# Patient Record
Sex: Female | Born: 1960 | Race: Black or African American | Hispanic: No | Marital: Single | Smoking: Former smoker
Health system: Southern US, Community
[De-identification: ages and names within clinical notes are randomized; demographics above are authoritative.]

## PROBLEM LIST (undated history)

## (undated) DIAGNOSIS — F419 Anxiety disorder, unspecified: Secondary | ICD-10-CM

## (undated) DIAGNOSIS — M1990 Unspecified osteoarthritis, unspecified site: Secondary | ICD-10-CM

## (undated) DIAGNOSIS — C50919 Malignant neoplasm of unspecified site of unspecified female breast: Secondary | ICD-10-CM

## (undated) DIAGNOSIS — E559 Vitamin D deficiency, unspecified: Secondary | ICD-10-CM

## (undated) DIAGNOSIS — R7303 Prediabetes: Secondary | ICD-10-CM

## (undated) DIAGNOSIS — R042 Hemoptysis: Secondary | ICD-10-CM

## (undated) DIAGNOSIS — T7840XA Allergy, unspecified, initial encounter: Secondary | ICD-10-CM

## (undated) DIAGNOSIS — J449 Chronic obstructive pulmonary disease, unspecified: Secondary | ICD-10-CM

## (undated) DIAGNOSIS — K219 Gastro-esophageal reflux disease without esophagitis: Secondary | ICD-10-CM

## (undated) DIAGNOSIS — C3490 Malignant neoplasm of unspecified part of unspecified bronchus or lung: Secondary | ICD-10-CM

## (undated) DIAGNOSIS — I10 Essential (primary) hypertension: Secondary | ICD-10-CM

## (undated) DIAGNOSIS — Z923 Personal history of irradiation: Secondary | ICD-10-CM

## (undated) DIAGNOSIS — IMO0001 Reserved for inherently not codable concepts without codable children: Secondary | ICD-10-CM

## (undated) HISTORY — DX: Allergy, unspecified, initial encounter: T78.40XA

## (undated) HISTORY — PX: GANGLION CYST EXCISION: SHX1691

## (undated) HISTORY — DX: Gastro-esophageal reflux disease without esophagitis: K21.9

## (undated) HISTORY — DX: Malignant neoplasm of unspecified site of unspecified female breast: C50.919

## (undated) HISTORY — DX: Malignant neoplasm of unspecified part of unspecified bronchus or lung: C34.90

## (undated) HISTORY — DX: Essential (primary) hypertension: I10

## (undated) HISTORY — PX: FOOT SURGERY: SHX648

## (undated) HISTORY — DX: Vitamin D deficiency, unspecified: E55.9

## (undated) HISTORY — PX: ABDOMINAL HYSTERECTOMY: SHX81

## (undated) HISTORY — PX: FRACTURE SURGERY: SHX138

## (undated) MED FILL — Fosaprepitant Dimeglumine For IV Infusion 150 MG (Base Eq): INTRAVENOUS | Qty: 5 | Status: AC

---

## 2006-02-28 ENCOUNTER — Emergency Department: Payer: Self-pay | Admitting: Emergency Medicine

## 2008-07-16 ENCOUNTER — Ambulatory Visit: Payer: Self-pay | Admitting: Family Medicine

## 2010-08-04 ENCOUNTER — Ambulatory Visit: Payer: Self-pay | Admitting: Family Medicine

## 2010-08-11 ENCOUNTER — Ambulatory Visit: Payer: Self-pay | Admitting: Family Medicine

## 2011-10-05 ENCOUNTER — Ambulatory Visit: Payer: Self-pay | Admitting: Family Medicine

## 2012-02-29 DIAGNOSIS — C50919 Malignant neoplasm of unspecified site of unspecified female breast: Secondary | ICD-10-CM

## 2012-02-29 HISTORY — PX: BREAST BIOPSY: SHX20

## 2012-02-29 HISTORY — PX: BREAST EXCISIONAL BIOPSY: SUR124

## 2012-02-29 HISTORY — PX: BREAST LUMPECTOMY: SHX2

## 2012-02-29 HISTORY — PX: BREAST SURGERY: SHX581

## 2012-02-29 HISTORY — DX: Malignant neoplasm of unspecified site of unspecified female breast: C50.919

## 2012-10-15 ENCOUNTER — Ambulatory Visit: Payer: Self-pay | Admitting: Nurse Practitioner

## 2012-10-17 ENCOUNTER — Ambulatory Visit: Payer: Self-pay | Admitting: Nurse Practitioner

## 2012-10-22 ENCOUNTER — Ambulatory Visit: Payer: Self-pay | Admitting: Nurse Practitioner

## 2012-10-25 LAB — PATHOLOGY REPORT

## 2012-10-26 ENCOUNTER — Ambulatory Visit: Payer: Self-pay | Admitting: Hematology and Oncology

## 2012-10-30 ENCOUNTER — Ambulatory Visit: Payer: Self-pay | Admitting: Hematology and Oncology

## 2012-11-01 ENCOUNTER — Ambulatory Visit (INDEPENDENT_AMBULATORY_CARE_PROVIDER_SITE_OTHER): Payer: BC Managed Care – PPO | Admitting: General Surgery

## 2012-11-01 ENCOUNTER — Telehealth: Payer: Self-pay | Admitting: *Deleted

## 2012-11-01 ENCOUNTER — Encounter: Payer: Self-pay | Admitting: *Deleted

## 2012-11-01 ENCOUNTER — Encounter: Payer: Self-pay | Admitting: General Surgery

## 2012-11-01 VITALS — BP 130/78 | HR 62 | Resp 12 | Ht 65.0 in | Wt 153.0 lb

## 2012-11-01 DIAGNOSIS — D0512 Intraductal carcinoma in situ of left breast: Secondary | ICD-10-CM

## 2012-11-01 DIAGNOSIS — D0590 Unspecified type of carcinoma in situ of unspecified breast: Secondary | ICD-10-CM

## 2012-11-01 NOTE — Patient Instructions (Addendum)
Patient to be scheduled for a left breast lumpectomy. She is advised to have genetic testing done.   Patient's surgery has been scheduled for 11-07-12 at [facility].

## 2012-11-01 NOTE — Telephone Encounter (Signed)
I talked with Connie Osborne ([facility]) regarding BRACA testing and she said she had talked with Connie Osborne and insurance will pay.  They will be handling the testing per Connie Osborne.

## 2012-11-01 NOTE — Progress Notes (Signed)
Patient ID: Connie Osborne, female   DOB: Feb 03, 1961, 52 y.o.   MRN: [id number]  Chief Complaint  Patient presents with  . Breast Problem    evaluation of DCIS    HPI Connie Osborne is a 52 y.o. female who presents for a breast evaluation. Recent mammogram revealed a cluster of microcalcifications in left breast. The patient had a left breast stereotactic biopsy on 10/22/12 that showed DCIS. The patient checks her breast regularly and gets regular mammograms done. She denies any problems with her breasts-current or in the past.  HPI  Past Medical History  Diagnosis Date  . Hypertension   . Allergy     Past Surgical History  Procedure Laterality Date  . Abdominal hysterectomy    . Foot surgery    . Ganglion cyst excision    . Fracture surgery      Family History  Problem Relation Age of Onset  . Cancer Sister 72    breast  . Cancer Sister 21    breast  . Cancer Other     breast    Social History History  Substance Use Topics  . Smoking status: Current Every Day Smoker  . Smokeless tobacco: Not on file  . Alcohol Use: No    No Known Allergies  Current Outpatient Prescriptions  Medication Sig Dispense Refill  . amLODipine (NORVASC) 10 MG tablet Take 10 mg by mouth daily.      . cetirizine (ZYRTEC) 10 MG tablet Take 10 mg by mouth as needed for allergies.      Marland Kitchen losartan-hydrochlorothiazide (HYZAAR) 100-12.5 MG per tablet Take 1 tablet by mouth daily.      . Vitamin D, Ergocalciferol, (DRISDOL) 50000 UNITS CAPS capsule Take 50,000 Units by mouth every 7 (seven) days.       No current facility-administered medications for this visit.    Review of Systems Review of Systems  Constitutional: Negative.   Respiratory: Negative.   Cardiovascular: Negative.     Blood pressure 130/78, pulse 62, resp. rate 12, height 5\' 5"  (1.651 m), weight 153 lb (69.4 kg).  Physical Exam Physical Exam  Constitutional: She is oriented to person, place, and time. She appears  well-developed and well-nourished.  Eyes: Conjunctivae are normal. No scleral icterus.  Neck: No thyromegaly present.  Cardiovascular: Normal rate, regular rhythm, normal heart sounds and normal pulses.   No murmur heard. No edema  Pulmonary/Chest: Effort normal and breath sounds normal. Right breast exhibits no inverted nipple, no mass, no nipple discharge, no skin change and no tenderness. Left breast exhibits no inverted nipple, no mass, no nipple discharge, no skin change and no tenderness.  In the left breast just medial of stereo biopsy puncher site at 3 o'clock there is a 3 cm firm mass likely representing the biopsy cavity.   Abdominal: Soft. Normal appearance and bowel sounds are normal. There is no hepatosplenomegaly. There is no tenderness. No hernia.  Lymphadenopathy:    She has no cervical adenopathy.    She has no axillary adenopathy.  Neurological: She is alert and oriented to person, place, and time.  Skin: Skin is warm and dry.    Data Reviewed  Mammogram and biopsy report reviewed in full.  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 High risk due to family history.     Plan    Discussed left breast lumpectomy as next step. Advised of possibility of finding an invasive  component in the lumpectomy specimen. Procedure explained to her in full.   Patient will also benefit with genetic testing. Both her sisters were diagnosed with breast cancer at early age. Her brother's daughter had breast cancer and was reportedly pos on genetic tesing. She is agreeable to having genetic test if insurance will cover.     This patient's surgery has been scheduled for 11-07-12 at [facility].   Elliana Bal G 11/01/2012, 2:47 PM

## 2012-11-01 NOTE — Addendum Note (Signed)
Addended by: Kieth Brightly on: 11/01/2012 02:54 PM   Modules accepted: Orders

## 2012-11-06 ENCOUNTER — Telehealth: Payer: Self-pay | Admitting: *Deleted

## 2012-11-06 ENCOUNTER — Ambulatory Visit: Payer: Self-pay | Admitting: Anesthesiology

## 2012-11-06 LAB — COMPREHENSIVE METABOLIC PANEL
BUN: 15 mg/dL (ref 7–18)
Bilirubin,Total: 0.2 mg/dL (ref 0.2–1.0)
Calcium, Total: 9.6 mg/dL (ref 8.5–10.1)
Chloride: 108 mmol/L — ABNORMAL HIGH (ref 98–107)
Co2: 28 mmol/L (ref 21–32)
Creatinine: 0.75 mg/dL (ref 0.60–1.30)
EGFR (African American): >60
Glucose: 93 mg/dL (ref 65–99)
Osmolality: 284 (ref 275–301)
Potassium: 3 mmol/L — ABNORMAL LOW (ref 3.5–5.1)
SGOT(AST): 21 U/L (ref 15–37)
SGPT (ALT): 17 U/L (ref 12–78)
Sodium: 142 mmol/L (ref 136–145)

## 2012-11-06 LAB — CBC
HCT: 37.3 % (ref 35.0–47.0)
HGB: 13.2 g/dL (ref 12.0–16.0)
MCH: 32.9 pg (ref 26.0–34.0)
MCHC: 35.4 g/dL (ref 32.0–36.0)
RBC: 4.01 10*6/uL (ref 3.80–5.20)
WBC: 9.7 10*3/uL (ref 3.6–11.0)

## 2012-11-06 NOTE — Telephone Encounter (Signed)
Pt aware preop potassium level was low, RX reviewed, pt agrees.  Potassium 20 meq one po now and one po tonight and one in the morning #3 no refills, per Dr Evette Cristal

## 2012-11-07 ENCOUNTER — Ambulatory Visit: Payer: Self-pay | Admitting: General Surgery

## 2012-11-07 DIAGNOSIS — D0590 Unspecified type of carcinoma in situ of unspecified breast: Secondary | ICD-10-CM

## 2012-11-08 ENCOUNTER — Encounter: Payer: Self-pay | Admitting: General Surgery

## 2012-11-12 ENCOUNTER — Encounter: Payer: Self-pay | Admitting: General Surgery

## 2012-11-20 ENCOUNTER — Encounter: Payer: Self-pay | Admitting: General Surgery

## 2012-11-20 ENCOUNTER — Ambulatory Visit (INDEPENDENT_AMBULATORY_CARE_PROVIDER_SITE_OTHER): Payer: BC Managed Care – PPO | Admitting: General Surgery

## 2012-11-20 VITALS — BP 144/76 | HR 74 | Resp 14 | Ht 65.0 in | Wt 156.0 lb

## 2012-11-20 DIAGNOSIS — T888XXA Other specified complications of surgical and medical care, not elsewhere classified, initial encounter: Secondary | ICD-10-CM

## 2012-11-20 DIAGNOSIS — D0512 Intraductal carcinoma in situ of left breast: Secondary | ICD-10-CM

## 2012-11-20 DIAGNOSIS — D0590 Unspecified type of carcinoma in situ of unspecified breast: Secondary | ICD-10-CM

## 2012-11-20 NOTE — Patient Instructions (Addendum)
Patient to return in 1 week for a follow up office visit. Patient is to be referred to Radiation Oncologist.   Patient has been scheduled for an appointment to see Dr. Rushie Chestnut at the [facility] on 11-21-12 at 9 am. She is aware of date, time, and instructions.

## 2012-11-20 NOTE — Progress Notes (Signed)
Patient presents for a routine post op visit. She underwent a left lumpectomy done on 11/07/12. Patient is doing well.  There is a moderate sized hematoma/seroma in lumpectomy site. No signs of infection. [place] of this area showed sizeable hematoma.   15 ml of old blood drawn off with an 18 gauge needle.   Pathology DCIS margins clear. ER PR weakly positive.   Patient has been scheduled for an appointment to see Dr. Rushie Chestnut at the [facility] on 11-21-12 at 9 am. She is aware of date, time, and instructions. Records have been forwarded for Dr. Kipp Laurence review.

## 2012-11-28 ENCOUNTER — Encounter: Payer: Self-pay | Admitting: General Surgery

## 2012-11-28 ENCOUNTER — Ambulatory Visit: Payer: Self-pay | Admitting: Hematology and Oncology

## 2012-11-28 ENCOUNTER — Ambulatory Visit: Payer: Self-pay | Admitting: General Surgery

## 2012-11-28 VITALS — BP 130/72 | HR 78 | Resp 12 | Ht 65.0 in | Wt 152.0 lb

## 2012-11-28 DIAGNOSIS — D0512 Intraductal carcinoma in situ of left breast: Secondary | ICD-10-CM

## 2012-11-28 NOTE — Patient Instructions (Addendum)
Return in one week for reevaluation of the hematoma prior to radiation therapy.

## 2012-11-28 NOTE — Progress Notes (Signed)
Patient ID: Connie Osborne, female   DOB: Jan 24, 1961, 53 y.o.   MRN: [id number]  Chief Complaint  Patient presents with  . Routine Post Op    HPI Connie Osborne is a 52 y.o. female.  Patient presents for a routine post op visit. She underwent a left breast lumpectomy for DCIS done on 11/07/12. Patient is doing well.  A sizeable hematoma was drained at her last visit. She states the area still feels a little hard.   HPI  Past Medical History  Diagnosis Date  . Hypertension   . Allergy     Past Surgical History  Procedure Laterality Date  . Abdominal hysterectomy    . Foot surgery    . Ganglion cyst excision    . Fracture surgery    . Breast surgery Left 2014    lumpectomy    Family History  Problem Relation Age of Onset  . Cancer Sister 14    breast  . Cancer Sister 35    breast  . Cancer Other     breast    Social History History  Substance Use Topics  . Smoking status: Current Every Day Smoker  . Smokeless tobacco: Not on file  . Alcohol Use: No    No Known Allergies  Current Outpatient Prescriptions  Medication Sig Dispense Refill  . amLODipine (NORVASC) 10 MG tablet Take 10 mg by mouth daily.      . cetirizine (ZYRTEC) 10 MG tablet Take 10 mg by mouth as needed for allergies.      Marland Kitchen losartan-hydrochlorothiazide (HYZAAR) 100-12.5 MG per tablet Take 1 tablet by mouth daily.      . traMADol (ULTRAM) 50 MG tablet Take 50 mg by mouth every 6 (six) hours as needed for pain.      . Vitamin D, Ergocalciferol, (DRISDOL) 50000 UNITS CAPS capsule Take 50,000 Units by mouth every 7 (seven) days.       No current facility-administered medications for this visit.    Review of Systems Review of Systems  Constitutional: Negative.   Respiratory: Negative.   Cardiovascular: Negative.     Blood pressure 130/72, pulse 78, resp. rate 12, height 5\' 5"  (1.651 m), weight 152 lb (68.947 kg).  Physical Exam Physical Exam  Constitutional: She is oriented to person, place, and  time. She appears well-developed and well-nourished.  Neurological: She is alert and oriented to person, place, and time.  Skin: Skin is warm and dry.   Left breast still with a moderate sized hematoma, some better than last week. 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 Repeat aspiration performed-70 ml old blood aspirated.     Plan    Return in one week for reevaluation of the hematoma prior to radiation therapy. Given that she is BRCA pos, she is not  A candidate for Mammosite.       Connie Osborne G 11/28/2012, 6:40 PM

## 2012-12-04 ENCOUNTER — Ambulatory Visit: Payer: BC Managed Care – PPO | Admitting: General Surgery

## 2012-12-04 ENCOUNTER — Encounter: Payer: Self-pay | Admitting: General Surgery

## 2012-12-04 VITALS — BP 138/84 | HR 78 | Resp 14 | Ht 65.0 in | Wt 155.0 lb

## 2012-12-04 DIAGNOSIS — D0512 Intraductal carcinoma in situ of left breast: Secondary | ICD-10-CM

## 2012-12-04 NOTE — Progress Notes (Signed)
Patient presents for a post-op left breast lumpectomy. The procedure was performed on 11/07/12. The patient denies any new problems at this time.   Hematoma left breast is much smaller.   There was 15 ml of old blood drained from left breast lumpectomy site today.    Patient is BRCA 1 positive and in view of this she is not a candidate for a mammosite. This patient can start radiation.   This patient has been scheduled for an appointment to see Dr. Rushie Chestnut at the [facility] on 12-11-12 at 3 pm. She has been notified of this by the Cancer Center and verbalizes understanding.

## 2012-12-04 NOTE — Patient Instructions (Addendum)
Patient to follow up in 1 month.   This patient has been scheduled for an appointment to see Dr. Rushie Chestnut at the [facility] on 12-11-12 at 3 pm. She has been notified of this by the Cancer Center.

## 2012-12-26 LAB — CBC CANCER CENTER
Basophil #: 0.1 x10 3/mm (ref 0.0–0.1)
Basophil %: 1.2 %
HCT: 39.9 % (ref 35.0–47.0)
Lymphocyte #: 2.1 x10 3/mm (ref 1.0–3.6)
Lymphocyte %: 27.7 %
MCH: 31.5 pg (ref 26.0–34.0)
MCV: 95 fL (ref 80–100)
Monocyte #: 0.6 x10 3/mm (ref 0.2–0.9)
Monocyte %: 8.4 %
Neutrophil %: 60.6 %
RDW: 13.4 % (ref 11.5–14.5)

## 2012-12-29 ENCOUNTER — Ambulatory Visit: Payer: Self-pay | Admitting: Hematology and Oncology

## 2013-01-02 LAB — CBC CANCER CENTER
Basophil #: 0.1 x10 3/mm (ref 0.0–0.1)
Basophil %: 1.2 %
Eosinophil #: 0.2 x10 3/mm (ref 0.0–0.7)
Eosinophil %: 3.2 %
HCT: 38.9 % (ref 35.0–47.0)
HGB: 13.2 g/dL (ref 12.0–16.0)
MCH: 32.1 pg (ref 26.0–34.0)
Monocyte #: 0.6 x10 3/mm (ref 0.2–0.9)
Monocyte %: 12.1 %
Neutrophil #: 2.8 x10 3/mm (ref 1.4–6.5)
Platelet: 293 x10 3/mm (ref 150–440)
RDW: 13.1 % (ref 11.5–14.5)
WBC: 5.3 x10 3/mm (ref 3.6–11.0)

## 2013-01-03 ENCOUNTER — Ambulatory Visit: Payer: BC Managed Care – PPO | Admitting: General Surgery

## 2013-01-03 LAB — COMPREHENSIVE METABOLIC PANEL
Albumin: 3.7 g/dL (ref 3.4–5.0)
BUN: 8 mg/dL (ref 7–18)
Bilirubin,Total: 0.2 mg/dL (ref 0.2–1.0)
Chloride: 106 mmol/L (ref 98–107)
Co2: 27 mmol/L (ref 21–32)
Creatinine: 0.72 mg/dL (ref 0.60–1.30)
EGFR (African American): >60
EGFR (Non-African Amer.): >60
Glucose: 114 mg/dL — ABNORMAL HIGH (ref 65–99)
Potassium: 3.2 mmol/L — ABNORMAL LOW (ref 3.5–5.1)
SGOT(AST): 19 U/L (ref 15–37)
Sodium: 143 mmol/L (ref 136–145)
Total Protein: 7.4 g/dL (ref 6.4–8.2)

## 2013-01-03 LAB — CBC CANCER CENTER
Eosinophil #: 0.2 x10 3/mm (ref 0.0–0.7)
Eosinophil %: 3 %
HCT: 41.7 % (ref 35.0–47.0)
Lymphocyte #: 2 x10 3/mm (ref 1.0–3.6)
Lymphocyte %: 29.6 %
MCHC: 33.5 g/dL (ref 32.0–36.0)
Monocyte #: 0.7 x10 3/mm (ref 0.2–0.9)
Platelet: 296 x10 3/mm (ref 150–440)
RBC: 4.4 10*6/uL (ref 3.80–5.20)
RDW: 13.1 % (ref 11.5–14.5)
WBC: 6.7 x10 3/mm (ref 3.6–11.0)

## 2013-01-09 LAB — CBC CANCER CENTER
Basophil %: 1.1 %
Eosinophil #: 0.1 x10 3/mm (ref 0.0–0.7)
Eosinophil %: 1.7 %
HGB: 13.4 g/dL (ref 12.0–16.0)
Lymphocyte #: 1.9 x10 3/mm (ref 1.0–3.6)
MCHC: 33.6 g/dL (ref 32.0–36.0)
MCV: 95 fL (ref 80–100)
Monocyte #: 0.6 x10 3/mm (ref 0.2–0.9)
Neutrophil #: 5 x10 3/mm (ref 1.4–6.5)
Neutrophil %: 64.9 %
Platelet: 287 x10 3/mm (ref 150–440)
RBC: 4.2 10*6/uL (ref 3.80–5.20)
RDW: 12.7 % (ref 11.5–14.5)
WBC: 7.6 x10 3/mm (ref 3.6–11.0)

## 2013-01-16 LAB — CBC CANCER CENTER
Basophil #: 0 x10 3/mm (ref 0.0–0.1)
Basophil %: 0.6 %
Eosinophil %: 2.8 %
Lymphocyte #: 2.1 x10 3/mm (ref 1.0–3.6)
MCHC: 33.9 g/dL (ref 32.0–36.0)
MCV: 95 fL (ref 80–100)
Monocyte %: 8.5 %
Neutrophil #: 3.2 x10 3/mm (ref 1.4–6.5)
Platelet: 309 x10 3/mm (ref 150–440)

## 2013-01-23 LAB — CBC CANCER CENTER
Basophil #: 0.1 x10 3/mm (ref 0.0–0.1)
Eosinophil #: 0.2 x10 3/mm (ref 0.0–0.7)
HCT: 39.1 % (ref 35.0–47.0)
MCHC: 33.7 g/dL (ref 32.0–36.0)
MCV: 94 fL (ref 80–100)
Monocyte #: 0.7 x10 3/mm (ref 0.2–0.9)
Neutrophil #: 5.3 x10 3/mm (ref 1.4–6.5)
Neutrophil %: 66.4 %
Platelet: 288 x10 3/mm (ref 150–440)
RBC: 4.14 10*6/uL (ref 3.80–5.20)
RDW: 13.1 % (ref 11.5–14.5)
WBC: 7.9 x10 3/mm (ref 3.6–11.0)

## 2013-01-28 ENCOUNTER — Ambulatory Visit: Payer: Self-pay | Admitting: Hematology and Oncology

## 2013-01-30 LAB — CBC CANCER CENTER
Basophil #: 0.1 x10 3/mm (ref 0.0–0.1)
Basophil %: 0.8 %
Eosinophil #: 0.3 x10 3/mm (ref 0.0–0.7)
Eosinophil %: 3.2 %
HCT: 40.6 % (ref 35.0–47.0)
Lymphocyte #: 1.3 x10 3/mm (ref 1.0–3.6)
Lymphocyte %: 13.3 %
MCH: 32.5 pg (ref 26.0–34.0)
MCHC: 34.1 g/dL (ref 32.0–36.0)
Monocyte #: 0.5 x10 3/mm (ref 0.2–0.9)
Monocyte %: 4.8 %
Neutrophil #: 7.8 x10 3/mm — ABNORMAL HIGH (ref 1.4–6.5)
Neutrophil %: 77.9 %
Platelet: 287 x10 3/mm (ref 150–440)
RDW: 13.1 % (ref 11.5–14.5)
WBC: 10.1 x10 3/mm (ref 3.6–11.0)

## 2013-02-20 ENCOUNTER — Encounter: Payer: Self-pay | Admitting: *Deleted

## 2013-02-28 ENCOUNTER — Ambulatory Visit: Payer: Self-pay | Admitting: Hematology and Oncology

## 2013-03-07 LAB — COMPREHENSIVE METABOLIC PANEL
ALK PHOS: 100 U/L
Albumin: 3.7 g/dL (ref 3.4–5.0)
Anion Gap: 7 (ref 7–16)
BILIRUBIN TOTAL: 0.2 mg/dL (ref 0.2–1.0)
BUN: 10 mg/dL (ref 7–18)
CHLORIDE: 103 mmol/L (ref 98–107)
CREATININE: 0.69 mg/dL (ref 0.60–1.30)
Calcium, Total: 9.7 mg/dL (ref 8.5–10.1)
Co2: 33 mmol/L — ABNORMAL HIGH (ref 21–32)
EGFR (Non-African Amer.): >60
Glucose: 118 mg/dL — ABNORMAL HIGH (ref 65–99)
Osmolality: 285 (ref 275–301)
POTASSIUM: 2.7 mmol/L — AB (ref 3.5–5.1)
SGOT(AST): 17 U/L (ref 15–37)
SGPT (ALT): 17 U/L (ref 12–78)
Sodium: 143 mmol/L (ref 136–145)
TOTAL PROTEIN: 7.2 g/dL (ref 6.4–8.2)

## 2013-03-07 LAB — CBC CANCER CENTER
BASOS ABS: 0.1 x10 3/mm (ref 0.0–0.1)
Basophil %: 1 %
EOS ABS: 0.1 x10 3/mm (ref 0.0–0.7)
Eosinophil %: 1.8 %
HCT: 38.6 % (ref 35.0–47.0)
HGB: 13 g/dL (ref 12.0–16.0)
Lymphocyte #: 2.1 x10 3/mm (ref 1.0–3.6)
Lymphocyte %: 31.8 %
MCH: 31.5 pg (ref 26.0–34.0)
MCHC: 33.7 g/dL (ref 32.0–36.0)
MCV: 94 fL (ref 80–100)
MONO ABS: 0.5 x10 3/mm (ref 0.2–0.9)
Monocyte %: 7.9 %
Neutrophil #: 3.9 x10 3/mm (ref 1.4–6.5)
Neutrophil %: 57.5 %
PLATELETS: 295 x10 3/mm (ref 150–440)
RBC: 4.12 10*6/uL (ref 3.80–5.20)
RDW: 13.3 % (ref 11.5–14.5)
WBC: 6.7 x10 3/mm (ref 3.6–11.0)

## 2013-03-26 ENCOUNTER — Encounter: Payer: Self-pay | Admitting: General Surgery

## 2013-03-26 ENCOUNTER — Ambulatory Visit (INDEPENDENT_AMBULATORY_CARE_PROVIDER_SITE_OTHER): Payer: BC Managed Care – PPO | Admitting: General Surgery

## 2013-03-26 VITALS — BP 124/72 | HR 80 | Resp 14 | Ht 65.0 in | Wt 153.0 lb

## 2013-03-26 DIAGNOSIS — Z1502 Genetic susceptibility to malignant neoplasm of ovary: Secondary | ICD-10-CM

## 2013-03-26 DIAGNOSIS — D0590 Unspecified type of carcinoma in situ of unspecified breast: Secondary | ICD-10-CM

## 2013-03-26 DIAGNOSIS — D0510 Intraductal carcinoma in situ of unspecified breast: Secondary | ICD-10-CM

## 2013-03-26 DIAGNOSIS — Z1501 Genetic susceptibility to malignant neoplasm of breast: Secondary | ICD-10-CM

## 2013-03-26 DIAGNOSIS — Z1509 Genetic susceptibility to other malignant neoplasm: Secondary | ICD-10-CM

## 2013-03-26 NOTE — Patient Instructions (Addendum)
Patient to return in 1 month with a left breast diagnostic mammogram. Patient to call our office with any new questions or concerns.   The patient has been asked to return to the office in one month for a unilateral left breast diagnostic mammogram. This has been arranged at the [facility] for 04-23-13 at 9 am (arrive 10 minutes prior). She is aware of date, time, and instructions.    Patient has been asked to return to the office once mammogram is completed.

## 2013-03-26 NOTE — Progress Notes (Signed)
Patient ID: [name], female   DOB: Jul 09, 1960, 53 y.o.   MRN: [id number]  Chief Complaint  Patient presents with  . Routine Post Op    HPI Connie Osborne is a 53 y.o. female here today following up from left breast lumpectomy. The procedure was performed on 11/07/12. The patient denies any new breast problems. She has completed her radiation therapy. Pt was started on antihormonal therapy by Dr. Everlene Other  HPI  Past Medical History  Diagnosis Date  . Hypertension   . Allergy     Past Surgical History  Procedure Laterality Date  . Abdominal hysterectomy    . Foot surgery    . Ganglion cyst excision    . Fracture surgery    . Breast surgery Left 2014    lumpectomy    Family History  Problem Relation Age of Onset  . Cancer Sister 68    breast  . Cancer Sister 67    breast  . Cancer Other     breast    Social History History  Substance Use Topics  . Smoking status: Current Every Day Smoker  . Smokeless tobacco: Not on file  . Alcohol Use: No    No Known Allergies  Current Outpatient Prescriptions  Medication Sig Dispense Refill  . amLODipine (NORVASC) 10 MG tablet Take 10 mg by mouth daily.      . cetirizine (ZYRTEC) 10 MG tablet Take 10 mg by mouth as needed for allergies.      Marland Kitchen losartan-hydrochlorothiazide (HYZAAR) 100-12.5 MG per tablet Take 1 tablet by mouth daily.      . traMADol (ULTRAM) 50 MG tablet Take 50 mg by mouth every 6 (six) hours as needed for pain.      . Vitamin D, Ergocalciferol, (DRISDOL) 50000 UNITS CAPS capsule Take 50,000 Units by mouth every 7 (seven) days.       No current facility-administered medications for this visit.    Review of Systems Review of Systems  Constitutional: Negative.   Respiratory: Negative.   Cardiovascular: Negative.     Blood pressure 124/72, pulse 80, resp. rate 14, height 5\' 5"  (1.651 m), weight 153 lb (69.4 kg).  Physical Exam Physical Exam  Constitutional: She is oriented to person, place, and time.  She appears well-developed and well-nourished.  Neck: Neck supple. No thyromegaly present.  Cardiovascular: Normal rate, regular rhythm and normal heart sounds.   No murmur heard. Pulmonary/Chest: Effort normal and breath sounds normal. Right breast exhibits no inverted nipple, no mass, no nipple discharge, no skin change and no tenderness. Left breast exhibits no inverted nipple, no mass, no nipple discharge, no skin change and no tenderness.  Lymphadenopathy:    She has no cervical adenopathy.    She has no axillary adenopathy.  Neurological: She is alert and oriented to person, place, and time.  Skin: Skin is warm and dry.    Data Reviewed    Assessment    DCIS left breast     Plan          The patient has been asked to return to the office in one month for a unilateral left breast diagnostic mammogram. This has been arranged at the [facility] for 04-23-13 at 9 am (arrive 10 minutes prior). She is aware of date, time, and instructions.    Patient has been asked to return to the office once mammogram is completed.   SANKAR,SEEPLAPUTHUR G 03/26/2013, 12:45 PM

## 2013-03-31 ENCOUNTER — Ambulatory Visit: Payer: Self-pay | Admitting: Hematology and Oncology

## 2013-04-23 ENCOUNTER — Ambulatory Visit: Payer: Self-pay | Admitting: General Surgery

## 2013-04-24 ENCOUNTER — Encounter: Payer: Self-pay | Admitting: General Surgery

## 2013-04-29 ENCOUNTER — Telehealth: Payer: Self-pay | Admitting: *Deleted

## 2013-04-29 NOTE — Telephone Encounter (Signed)
This pt called our office this morning stating that she had received a letter from [facility] regarding her mammogram that she had done on 04/23/2013 after her surgery. She said the letter stated that she needed to follow up with her primary care due to concern that something showed up on the mammogram. Pt states that since having the mammogram done she has had a lot of breast discomfort. She has been using otc medications as needed, advil. I suggested that she might try wearing a snug bra day and night and try a warm compress as needed but with care not to make it too hot. She is scheduled to see Dr. [name] on Thursday May 02, 2013 @ 0915. Pt advised to keep this appointment and that Dr. [name] will discuss her mammogram with her as well as do exam at that time. I will advise Dr. [name] of her concerns and call her if any changes to the current plan. She verbalized her understanding.

## 2013-05-02 ENCOUNTER — Encounter: Payer: Self-pay | Admitting: General Surgery

## 2013-05-02 ENCOUNTER — Ambulatory Visit (INDEPENDENT_AMBULATORY_CARE_PROVIDER_SITE_OTHER): Payer: BC Managed Care – PPO | Admitting: General Surgery

## 2013-05-02 VITALS — BP 130/74 | HR 76 | Resp 12 | Ht 63.0 in | Wt 152.0 lb

## 2013-05-02 DIAGNOSIS — D0510 Intraductal carcinoma in situ of unspecified breast: Secondary | ICD-10-CM

## 2013-05-02 DIAGNOSIS — D0590 Unspecified type of carcinoma in situ of unspecified breast: Secondary | ICD-10-CM

## 2013-05-02 NOTE — Progress Notes (Signed)
Patient ID: Connie Osborne, female   DOB: May 01, 1960, 53 y.o.   MRN: [id number]  Chief Complaint  Patient presents with  . Follow-up    mammogram    HPI Connie Osborne is a 53 y.o. female who presents for a breast evaluation. The most recent left breast mammogram and ultrasound was done on 04/23/13. Patient does perform regular self breast checks and gets regular mammograms done.    HPI  Past Medical History  Diagnosis Date  . Hypertension   . Allergy     Past Surgical History  Procedure Laterality Date  . Abdominal hysterectomy    . Foot surgery    . Ganglion cyst excision    . Fracture surgery    . Breast surgery Left 2014    lumpectomy    Family History  Problem Relation Age of Onset  . Cancer Sister 60    breast  . Cancer Sister 70    breast  . Cancer Other     breast    Social History History  Substance Use Topics  . Smoking status: Current Every Day Smoker  . Smokeless tobacco: Not on file  . Alcohol Use: No    No Known Allergies  Current Outpatient Prescriptions  Medication Sig Dispense Refill  . amLODipine (NORVASC) 10 MG tablet Take 10 mg by mouth daily.      . cetirizine (ZYRTEC) 10 MG tablet Take 10 mg by mouth as needed for allergies.      Marland Kitchen losartan-hydrochlorothiazide (HYZAAR) 100-12.5 MG per tablet Take 1 tablet by mouth daily.      . Vitamin D, Ergocalciferol, (DRISDOL) 50000 UNITS CAPS capsule Take 50,000 Units by mouth every 7 (seven) days.       No current facility-administered medications for this visit.    Review of Systems Review of Systems  Constitutional: Negative.   Respiratory: Negative.   Cardiovascular: Negative.     Blood pressure 130/74, pulse 76, resp. rate 12, height _0  (1.6 m), weight 152 lb (68.947 kg).  Physical Exam Physical Exam  Constitutional: She is oriented to person, place, and time. She appears well-developed and well-nourished.  Eyes: Conjunctivae are normal.  Neck: Neck supple.  Cardiovascular: Normal  rate, regular rhythm and normal heart sounds.   Pulmonary/Chest: Right breast exhibits no inverted nipple, no mass, no nipple discharge, no skin change and no tenderness. Left breast exhibits no inverted nipple, no mass, no nipple discharge, no skin change and no tenderness.   firm area at lumpecectomy site at 3 o'clock left breast   Lymphadenopathy:    She has no cervical adenopathy.    She has no axillary adenopathy.  Neurological: She is alert and oriented to person, place, and time.  Skin: Skin is warm and dry.    Data Reviewed Mammogram left  Reviewed . Mild density superior left breast with negative ultrasound.   Assessment    Stable exam . Patient is 6 months post left lumpectomy and radiation for DCIS. Patient is currently on Tamoxifen . She is BRCA1 positive.    Plan    Patient to return in 5 months bilateral diagnotic mammogram . Patient to continue to perform monthly self breast exams.        Connie Osborne 05/02/2013, 9:05 AM

## 2013-05-02 NOTE — Patient Instructions (Signed)
Continue breast self exam Call for any problems.

## 2013-06-13 ENCOUNTER — Ambulatory Visit: Payer: Self-pay | Admitting: Hematology and Oncology

## 2013-06-14 LAB — COMPREHENSIVE METABOLIC PANEL
ALK PHOS: 120 U/L — AB
AST: 12 U/L — AB (ref 15–37)
Albumin: 3.6 g/dL (ref 3.4–5.0)
Anion Gap: 7 (ref 7–16)
BUN: 12 mg/dL (ref 7–18)
Bilirubin,Total: 0.2 mg/dL (ref 0.2–1.0)
CALCIUM: 9.7 mg/dL (ref 8.5–10.1)
CREATININE: 0.84 mg/dL (ref 0.60–1.30)
Chloride: 105 mmol/L (ref 98–107)
Co2: 33 mmol/L — ABNORMAL HIGH (ref 21–32)
EGFR (Non-African Amer.): >60
GLUCOSE: 128 mg/dL — AB (ref 65–99)
OSMOLALITY: 290 (ref 275–301)
Potassium: 2.9 mmol/L — ABNORMAL LOW (ref 3.5–5.1)
SGPT (ALT): 22 U/L (ref 12–78)
Sodium: 145 mmol/L (ref 136–145)
Total Protein: 7.3 g/dL (ref 6.4–8.2)

## 2013-06-14 LAB — CBC CANCER CENTER
BASOS ABS: 0.1 x10 3/mm (ref 0.0–0.1)
BASOS PCT: 1.3 %
EOS PCT: 2.3 %
Eosinophil #: 0.2 x10 3/mm (ref 0.0–0.7)
HCT: 38.1 % (ref 35.0–47.0)
HGB: 12.8 g/dL (ref 12.0–16.0)
LYMPHS ABS: 2.2 x10 3/mm (ref 1.0–3.6)
Lymphocyte %: 28.3 %
MCH: 32.3 pg (ref 26.0–34.0)
MCHC: 33.7 g/dL (ref 32.0–36.0)
MCV: 96 fL (ref 80–100)
MONOS PCT: 6.3 %
Monocyte #: 0.5 x10 3/mm (ref 0.2–0.9)
NEUTROS ABS: 4.9 x10 3/mm (ref 1.4–6.5)
Neutrophil %: 61.8 %
Platelet: 304 x10 3/mm (ref 150–440)
RBC: 3.97 10*6/uL (ref 3.80–5.20)
RDW: 12.8 % (ref 11.5–14.5)
WBC: 7.9 x10 3/mm (ref 3.6–11.0)

## 2013-06-28 ENCOUNTER — Ambulatory Visit: Payer: Self-pay | Admitting: Hematology and Oncology

## 2013-08-02 ENCOUNTER — Ambulatory Visit: Payer: Self-pay | Admitting: Hematology and Oncology

## 2013-10-08 ENCOUNTER — Ambulatory Visit: Payer: BC Managed Care – PPO | Admitting: General Surgery

## 2013-10-16 ENCOUNTER — Ambulatory Visit: Payer: Self-pay | Admitting: Hematology and Oncology

## 2013-10-17 ENCOUNTER — Ambulatory Visit: Payer: Self-pay | Admitting: Hematology and Oncology

## 2013-10-28 ENCOUNTER — Ambulatory Visit: Payer: BC Managed Care – PPO | Admitting: General Surgery

## 2013-11-14 ENCOUNTER — Encounter: Payer: Self-pay | Admitting: *Deleted

## 2013-12-19 ENCOUNTER — Ambulatory Visit: Payer: Self-pay | Admitting: Internal Medicine

## 2013-12-20 LAB — COMPREHENSIVE METABOLIC PANEL
ALK PHOS: 139 U/L — AB
ANION GAP: 11 (ref 7–16)
AST: 14 U/L — AB (ref 15–37)
Albumin: 3.6 g/dL (ref 3.4–5.0)
BILIRUBIN TOTAL: 0.2 mg/dL (ref 0.2–1.0)
BUN: 11 mg/dL (ref 7–18)
Calcium, Total: 9.3 mg/dL (ref 8.5–10.1)
Chloride: 105 mmol/L (ref 98–107)
Co2: 28 mmol/L (ref 21–32)
Creatinine: 0.76 mg/dL (ref 0.60–1.30)
EGFR (Non-African Amer.): >60
Glucose: 108 mg/dL — ABNORMAL HIGH (ref 65–99)
Osmolality: 287 (ref 275–301)
POTASSIUM: 3.2 mmol/L — AB (ref 3.5–5.1)
SGPT (ALT): 18 U/L
Sodium: 144 mmol/L (ref 136–145)
Total Protein: 7.2 g/dL (ref 6.4–8.2)

## 2013-12-20 LAB — CBC CANCER CENTER
Basophil #: 0.1 x10 3/mm (ref 0.0–0.1)
Basophil %: 1.4 %
Eosinophil #: 0.2 x10 3/mm (ref 0.0–0.7)
Eosinophil %: 3.2 %
HCT: 39.7 % (ref 35.0–47.0)
HGB: 13.4 g/dL (ref 12.0–16.0)
Lymphocyte #: 2.4 x10 3/mm (ref 1.0–3.6)
Lymphocyte %: 36.2 %
MCH: 32.3 pg (ref 26.0–34.0)
MCHC: 33.7 g/dL (ref 32.0–36.0)
MCV: 96 fL (ref 80–100)
MONOS PCT: 6.2 %
Monocyte #: 0.4 x10 3/mm (ref 0.2–0.9)
NEUTROS PCT: 53 %
Neutrophil #: 3.5 x10 3/mm (ref 1.4–6.5)
Platelet: 308 x10 3/mm (ref 150–440)
RBC: 4.15 10*6/uL (ref 3.80–5.20)
RDW: 12.8 % (ref 11.5–14.5)
WBC: 6.6 x10 3/mm (ref 3.6–11.0)

## 2013-12-23 LAB — CANCER ANTIGEN 27.29: CA 27.29: 22.5 U/mL (ref 0.0–38.6)

## 2013-12-29 ENCOUNTER — Ambulatory Visit: Payer: Self-pay | Admitting: Internal Medicine

## 2013-12-30 ENCOUNTER — Encounter: Payer: Self-pay | Admitting: General Surgery

## 2014-06-20 NOTE — Op Note (Signed)
PATIENT NAME:  Connie Osborne, Connie Osborne MR#:  [id number] DATE OF BIRTH:  [birth year]  DATE OF PROCEDURE:  11/07/2012  PREOPERATIVE DIAGNOSIS: Ductal carcinoma in situ, left breast.   POSTOPERATIVE DIAGNOSIS: Ductal carcinoma in situ, left breast.  OPERATION: Lumpectomy, left breast, with ultrasound guidance.   SURGEON: Mckinley Jewel, MD   ANESTHESIA: General.   COMPLICATIONS: None.   ESTIMATED BLOOD LOSS: Approximately 20 mL.   DRAINS: None.   DESCRIPTION OF PROCEDURE: The patient was placed in the supine position on the operating table and put to sleep with an LMA. The left breast was prepped and draped out as a sterile field. The patient's biopsy site was located at the 3:00 location about 4 cm from the nipple, and ultrasound was performed to identify the biopsy cavity and the clip within it.  A radial incision was made overlying this area and carried down through the subcutaneous tissue. The skin and subcutaneous tissue were elevated on both sides, and with palpation of a firm biopsy cavity, a circumferential excision was performed using cautery for control of bleeding. The excised tissue was then tagged for the margins and sent to pathology for processing. The lumpectomy site was inspected, and after ensuring hemostasis the wound was irrigated and closed. The deeper glandular tissue was approximated with interrupted 2-0 Vicryl stitches, the subcutaneous tissue also with 2-0 Vicryl, and the skin closed with subcuticular 4-0 Vicryl covered with Dermabond.       The procedure was well tolerated. She was subsequently extubated and returned to the recovery room in stable condition.  ____________________________ S.Robinette Haines, MD sgs:cb D: 11/07/2012 17:01:46 ET T: 11/07/2012 17:44:07 ET JOB#: [id number]  cc: S.G. [name], MD, <Dictator> [facility] Robinette Haines MD ELECTRONICALLY SIGNED 11/07/2012 18:38

## 2014-06-20 NOTE — Consult Note (Signed)
Reason for Visit: This 54 year old Female patient presents to the clinic for initial evaluation of  breast cancer .   Referred by Dr. [name].  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 Patient underwent stereotactic guided biopsy positive for ductal carcinoma in situ weakly ER/PR positive.she then underwent a wide local excision with removal of the initial biopsy cavity showing high-grade ductal carcinoma in situ with comedonecrosis measuring at least 5 mm. Margins were clear at 5 mm. Patient has had some swelling and a seroma cavity which is at been drained by Dr. Emilee Hero. She is now referred to radiation oncology for consideration of treatment.  Past Hx:    DCIS Left Breast:    Tobacco Use:    Breast Cancer:    Tobacco Use:    Acid Reflux:    Vitamin D Deficiency:    Allergies:    Hypertension:    Bilateral Foot Surgery:    Ganglion Cyst Removal, Bilateral wrists:    Hysterectomy - Total:   Past, Family and Social History:  Past Medical History positive   Cardiovascular hypertension   Respiratory seasonal rhinitis   Gastrointestinal GERD   Past Surgical History total hysterectomy, ganglion cyst removalbilateral foot surgery   Family History positive   Family History Comments strong family history of breast cancer with 2 sisters with premenopausal breast cancer patient has been tested for BRCA1 gene   Social  History positive   Social History Comments greater than 40-pack-year smoking history continues to smoke.   Additional Past Medical and Surgical History accompanied by nurse navigator today   Allergies:   Accupril: Cough  Percocet 10/325: Unknown  Home Meds:  Home Medications: Medication Instructions Status  ZyrTEC 10 mg oral tablet 1 tab(s) orally once a day, As Needed Active  hydrochlorothiazide-losartan 12.5 mg-100 mg oral tablet 1 tab(s) orally once a day Active  Vitamin D2 50,000 intl units oral capsule 1 cap(s) orally once a week Active  ranitidine 150 mg oral tablet 1 tab(s) orally once a day, As Needed Active  Norvasc 10 mg oral tablet 1 tab(s) orally once a day (in the morning) Active  potassium chloride 1 dose(s) orally 2 times a day yesterday and this am as a preop. Active  traMADol 50 mg oral tablet 1 tab(s) orally every 6 hours, As Needed - for Pain Active   Review of Systems:  General negative   Performance Status (ECOG) 0   Skin negative   Breast see HPI   Ophthalmologic negative   ENMT negative   Respiratory and Thorax negative   Cardiovascular negative   Gastrointestinal negative   Genitourinary negative   Musculoskeletal negative   Neurological negative   Psychiatric negative   Hematology/Lymphatics negative   Endocrine negative   Allergic/Immunologic negative   Nursing Notes:  Nursing Vital Signs and Chemo Nursing Nursing Notes: *CC Vital Signs Flowsheet:   24-Sep-14 08:59  Temp Temperature 97.3  Pulse Pulse 72  Respirations Respirations 20  SBP SBP 131  DBP DBP 80  Pain Scale (0-10)  0  Current Weight (kg) (kg) 70.3  Height (cm) centimeters 166.5  BSA (m2) 1.7   Physical Exam:  General/Skin/HEENT:  General normal   Skin normal   Eyes normal   ENMT normal   Head and Neck normal   Additional PE well-developed female in NAD. Left breast status post wide local excision. There still some swelling of the left breast most  likely associated with seroma cavity. No dominant mass or nodularity is noted in either breast into position examined. No axillary or clavicular adenopathy is appreciated. Lungs are clear to A&P cardiac examination shows regular rate and rhythm.   Breasts/Resp/CV/GI/GU:  Respiratory and Thorax normal   Cardiovascular normal   Gastrointestinal normal   Genitourinary normal   MS/Neuro/Psych/Lymph:  Musculoskeletal normal   Neurological normal   Lymphatics normal   Other Results:  Radiology Results: LabUnknown:    20-Aug-14 10:54, Digital Additional Views Lt Breast [facility])  PACS Image   [facility]:  Digital Additional Views Lt Breast (SCR)   REASON FOR EXAM:    av lt calcifications  COMMENTS:       PROCEDURE: MAM - MAM DGTL ADD VW LT  SCR  - Oct 17 2012 10:54AM     *RADIOLOGY REPORT*    Clinical Data:  The patient returns after screening study for  evaluation of left breast calcifications.    DIGITAL DIAGNOSTIC LEFT MAMMOGRAM    Comparison: 10/15/2012 and earlier    Findings:  ACR Breast Density Category c:  The breast tissue is  heterogeneously dense, which may obscure small masses.    Magnified views are performed of calcificationsin the lateral  portion of the left breast.  A grouping of calcifications varies in  size, shape, and density and measures 1.7 x 0.9 cm.  The findings  are suspicious for possible ductal carcinoma in situ.     IMPRESSION:  Suspicious microcalcifications in the lateral portion of the left  breast.    RECOMMENDATION:  Stereotactic guided core biopsy is recommended.  The patient's  physician will be contacted regarding scheduling of this procedure.  I have discussed the findings and recommendations with the patient.  Results were also provided in writing at the conclusion of the  visit.  If applicable, a reminder letter will be sent to the  patient regarding her next appointment.    BI-RADS CATEGORY 4:  Suspicious abnormality -  biopsy should be  considered.      Original Report Authenticated By: Nolon Nations, M.D.         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 she is cautionary based on DCIS diagnosis at age. I am concerned about possible BRCA1 positive gene which would make her contraindicated for partial breast irradiation. I discussed the case directly with Dr. Emilee Hero. She still needs a few weeks to heal before he would even a template MammoSite catheter. We will hopefully have those results of the packet testing performed by that time. Should it be negative to go ahead with attempted MammoSite catheter placement and treat her 3400 cGy using high-dose rate remote afterloading after BrachyVision treatment planning.  Risks and benefits of treatment including slight postage stamp area of erythema out over her balloon catheter, fatigue, or explained in detail to the patient. I also went over recommendations for whole breast radiation should it be decided not to pursue accelerated partial breast irradiation. I discussed the case personally Dr. Emilee Hero. [place] Crane be seeing her next week for evaluation of her aromatase  I would like to take this opportunity to thank you for allowing me to continue to participate in this patient's care.  CC Referral:  cc: Dr. [name], Dr. Raechel Chute   Electronic Signatures: Baruch Gouty, Roda Shutters (MD)  (Signed 24-Sep-14 10:58)  Authored: HPI, Diagnosis, Past Hx, PFSH, Allergies, Home Meds, ROS, Nursing Notes, Physical Exam, Other Results, Relevent Results, Encounter Assessment and Plan, CC Referring Physician   Last Updated: 24-Sep-14 10:58 by Armstead Peaks (MD)

## 2014-07-24 ENCOUNTER — Other Ambulatory Visit: Payer: Self-pay

## 2014-07-24 ENCOUNTER — Ambulatory Visit: Payer: Self-pay | Admitting: Hematology and Oncology

## 2014-08-19 ENCOUNTER — Inpatient Hospital Stay: Payer: BLUE CROSS/BLUE SHIELD

## 2014-08-19 ENCOUNTER — Inpatient Hospital Stay: Payer: BLUE CROSS/BLUE SHIELD | Admitting: Hematology and Oncology

## 2014-09-15 ENCOUNTER — Other Ambulatory Visit: Payer: Self-pay

## 2014-09-15 DIAGNOSIS — D0510 Intraductal carcinoma in situ of unspecified breast: Secondary | ICD-10-CM

## 2014-09-23 ENCOUNTER — Inpatient Hospital Stay: Payer: BLUE CROSS/BLUE SHIELD | Attending: Hematology and Oncology

## 2014-09-23 ENCOUNTER — Inpatient Hospital Stay: Payer: BLUE CROSS/BLUE SHIELD | Admitting: Hematology and Oncology

## 2014-10-21 ENCOUNTER — Inpatient Hospital Stay: Payer: BLUE CROSS/BLUE SHIELD

## 2014-10-21 ENCOUNTER — Inpatient Hospital Stay: Payer: BLUE CROSS/BLUE SHIELD | Admitting: Hematology and Oncology

## 2014-11-25 ENCOUNTER — Inpatient Hospital Stay: Payer: BLUE CROSS/BLUE SHIELD | Attending: Hematology and Oncology

## 2014-11-25 ENCOUNTER — Inpatient Hospital Stay: Payer: BLUE CROSS/BLUE SHIELD | Admitting: Hematology and Oncology

## 2015-06-30 ENCOUNTER — Encounter: Payer: Self-pay | Admitting: Emergency Medicine

## 2015-06-30 ENCOUNTER — Observation Stay
Admission: EM | Admit: 2015-06-30 | Discharge: 2015-06-30 | Disposition: A | Discharge status: Home / Self Care | Payer: BLUE CROSS/BLUE SHIELD | Attending: Internal Medicine | Admitting: Internal Medicine

## 2015-06-30 ENCOUNTER — Emergency Department: Payer: BLUE CROSS/BLUE SHIELD

## 2015-06-30 DIAGNOSIS — Z853 Personal history of malignant neoplasm of breast: Secondary | ICD-10-CM | POA: Insufficient documentation

## 2015-06-30 DIAGNOSIS — J189 Pneumonia, unspecified organism: Secondary | ICD-10-CM | POA: Insufficient documentation

## 2015-06-30 DIAGNOSIS — J439 Emphysema, unspecified: Secondary | ICD-10-CM | POA: Diagnosis not present

## 2015-06-30 DIAGNOSIS — E559 Vitamin D deficiency, unspecified: Secondary | ICD-10-CM | POA: Diagnosis not present

## 2015-06-30 DIAGNOSIS — R911 Solitary pulmonary nodule: Secondary | ICD-10-CM | POA: Insufficient documentation

## 2015-06-30 DIAGNOSIS — Z801 Family history of malignant neoplasm of trachea, bronchus and lung: Secondary | ICD-10-CM | POA: Diagnosis not present

## 2015-06-30 DIAGNOSIS — Z79899 Other long term (current) drug therapy: Secondary | ICD-10-CM | POA: Insufficient documentation

## 2015-06-30 DIAGNOSIS — Z923 Personal history of irradiation: Secondary | ICD-10-CM | POA: Diagnosis not present

## 2015-06-30 DIAGNOSIS — Z90710 Acquired absence of both cervix and uterus: Secondary | ICD-10-CM | POA: Insufficient documentation

## 2015-06-30 DIAGNOSIS — Z720 Tobacco use: Secondary | ICD-10-CM | POA: Diagnosis not present

## 2015-06-30 DIAGNOSIS — K219 Gastro-esophageal reflux disease without esophagitis: Secondary | ICD-10-CM | POA: Diagnosis not present

## 2015-06-30 DIAGNOSIS — Z885 Allergy status to narcotic agent status: Secondary | ICD-10-CM | POA: Insufficient documentation

## 2015-06-30 DIAGNOSIS — Z9889 Other specified postprocedural states: Secondary | ICD-10-CM | POA: Diagnosis not present

## 2015-06-30 DIAGNOSIS — Z888 Allergy status to other drugs, medicaments and biological substances status: Secondary | ICD-10-CM | POA: Insufficient documentation

## 2015-06-30 DIAGNOSIS — F17210 Nicotine dependence, cigarettes, uncomplicated: Secondary | ICD-10-CM | POA: Diagnosis not present

## 2015-06-30 DIAGNOSIS — Z803 Family history of malignant neoplasm of breast: Secondary | ICD-10-CM | POA: Diagnosis not present

## 2015-06-30 DIAGNOSIS — I10 Essential (primary) hypertension: Secondary | ICD-10-CM | POA: Diagnosis not present

## 2015-06-30 DIAGNOSIS — R042 Hemoptysis: Secondary | ICD-10-CM | POA: Diagnosis not present

## 2015-06-30 LAB — CBC WITH DIFFERENTIAL/PLATELET
BASOS ABS: 0.1 10*3/uL (ref 0–0.1)
Basophils Relative: 1 %
EOS PCT: 2 %
Eosinophils Absolute: 0.2 10*3/uL (ref 0–0.7)
HCT: 36.2 % (ref 35.0–47.0)
Hemoglobin: 12.7 g/dL (ref 12.0–16.0)
LYMPHS PCT: 29 %
Lymphs Abs: 2.4 10*3/uL (ref 1.0–3.6)
MCH: 32.8 pg (ref 26.0–34.0)
MCHC: 35 g/dL (ref 32.0–36.0)
MCV: 93.8 fL (ref 80.0–100.0)
Monocytes Absolute: 0.6 10*3/uL (ref 0.2–0.9)
Monocytes Relative: 7 %
NEUTROS ABS: 5 10*3/uL (ref 1.4–6.5)
Neutrophils Relative %: 61 %
PLATELETS: 296 10*3/uL (ref 150–440)
RBC: 3.86 MIL/uL (ref 3.80–5.20)
RDW: 13.3 % (ref 11.5–14.5)
WBC: 8.4 10*3/uL (ref 3.6–11.0)

## 2015-06-30 LAB — BASIC METABOLIC PANEL
ANION GAP: 8 (ref 5–15)
BUN: 11 mg/dL (ref 6–20)
CO2: 26 mmol/L (ref 22–32)
Calcium: 9.6 mg/dL (ref 8.9–10.3)
Chloride: 108 mmol/L (ref 101–111)
Creatinine, Ser: 0.61 mg/dL (ref 0.44–1.00)
GFR calc Af Amer: >60 mL/min (ref >60)
Glucose, Bld: 128 mg/dL — ABNORMAL HIGH (ref 65–99)
POTASSIUM: 3.4 mmol/L — AB (ref 3.5–5.1)
SODIUM: 142 mmol/L (ref 135–145)

## 2015-06-30 LAB — PROTIME-INR
INR: 0.99
PROTHROMBIN TIME: 13.3 s (ref 11.4–15.0)

## 2015-06-30 LAB — APTT: aPTT: 37 seconds — ABNORMAL HIGH (ref 24–36)

## 2015-06-30 MED ORDER — LORATADINE 10 MG PO TABS
10.0000 mg | ORAL_TABLET | Freq: Every day | ORAL | Status: DC
  Filled 2015-06-30: qty 1

## 2015-06-30 MED ORDER — POTASSIUM CHLORIDE CRYS ER 20 MEQ PO TBCR
40.0000 meq | EXTENDED_RELEASE_TABLET | Freq: Once | ORAL | Status: AC
  Administered 2015-06-30: 40 meq via ORAL
  Filled 2015-06-30: qty 2

## 2015-06-30 MED ORDER — IPRATROPIUM-ALBUTEROL 0.5-2.5 (3) MG/3ML IN SOLN: 3.0000 mL | Freq: Four times a day (QID) | RESPIRATORY_TRACT | Status: DC | PRN

## 2015-06-30 MED ORDER — ONDANSETRON HCL 4 MG/2ML IJ SOLN: 4.0000 mg | Freq: Four times a day (QID) | INTRAMUSCULAR | Status: DC | PRN

## 2015-06-30 MED ORDER — ACETAMINOPHEN 650 MG RE SUPP: 650.0000 mg | Freq: Four times a day (QID) | RECTAL | Status: DC | PRN

## 2015-06-30 MED ORDER — ACETAMINOPHEN 325 MG PO TABS: 650.0000 mg | ORAL_TABLET | Freq: Four times a day (QID) | ORAL | Status: DC | PRN

## 2015-06-30 MED ORDER — METOPROLOL SUCCINATE ER 25 MG PO TB24
25.0000 mg | ORAL_TABLET | Freq: Every day | ORAL | Status: DC
  Administered 2015-06-30: 25 mg via ORAL
  Filled 2015-06-30: qty 1

## 2015-06-30 MED ORDER — LEVOFLOXACIN IN D5W 500 MG/100ML IV SOLN
500.0000 mg | INTRAVENOUS | Status: DC
  Administered 2015-06-30: 500 mg via INTRAVENOUS
  Filled 2015-06-30: qty 100

## 2015-06-30 MED ORDER — AMLODIPINE BESYLATE 10 MG PO TABS
10.0000 mg | ORAL_TABLET | Freq: Every day | ORAL | Status: DC
  Administered 2015-06-30: 10 mg via ORAL
  Filled 2015-06-30: qty 1

## 2015-06-30 MED ORDER — METHYLPREDNISOLONE SODIUM SUCC 125 MG IJ SOLR
60.0000 mg | Freq: Four times a day (QID) | INTRAMUSCULAR | Status: DC
  Administered 2015-06-30: 60 mg via INTRAVENOUS
  Filled 2015-06-30: qty 2

## 2015-06-30 MED ORDER — SODIUM CHLORIDE 0.9 % IV SOLN: 250.0000 mL | INTRAVENOUS | Status: DC | PRN

## 2015-06-30 MED ORDER — SODIUM CHLORIDE 0.9% FLUSH: 3.0000 mL | INTRAVENOUS | Status: DC | PRN

## 2015-06-30 MED ORDER — ONDANSETRON HCL 4 MG PO TABS: 4.0000 mg | ORAL_TABLET | Freq: Four times a day (QID) | ORAL | Status: DC | PRN

## 2015-06-30 MED ORDER — LOSARTAN POTASSIUM 50 MG PO TABS
100.0000 mg | ORAL_TABLET | Freq: Every day | ORAL | Status: DC
Start: 2015-06-30 — End: 2015-06-30
  Administered 2015-06-30: 100 mg via ORAL
  Filled 2015-06-30: qty 2

## 2015-06-30 MED ORDER — IOPAMIDOL (ISOVUE-300) INJECTION 61%
75.0000 mL | Freq: Once | INTRAVENOUS | Status: AC | PRN
  Administered 2015-06-30: 75 mL via INTRAVENOUS
  Filled 2015-06-30: qty 75

## 2015-06-30 MED ORDER — BUPROPION HCL ER (SMOKING DET) 150 MG PO TB12: 150.0000 mg | ORAL_TABLET | Freq: Every day | ORAL | Status: DC

## 2015-06-30 MED ORDER — BUPROPION HCL ER (XL) 150 MG PO TB24
150.0000 mg | ORAL_TABLET | Freq: Every day | ORAL | Status: DC
  Administered 2015-06-30: 150 mg via ORAL
  Filled 2015-06-30: qty 1

## 2015-06-30 MED ORDER — SODIUM CHLORIDE 0.9% FLUSH
3.0000 mL | Freq: Two times a day (BID) | INTRAVENOUS | Status: DC
  Administered 2015-06-30: 3 mL via INTRAVENOUS

## 2015-06-30 NOTE — Discharge Summary (Signed)
Connie Osborne, 55 y.o., DOB 05/14/1960, MRN [id number]. Admission date: 06/30/2015 Discharge Date 06/30/2015 Primary MD Placido Sou, MD Admitting Physician Dustin Flock, MD  Admission Diagnosis  Pulmonary nodule, right [R91.1]  Discharge Diagnosis   Active Problems:   Hemoptysis due to likely lung cancer  HTN Allergery H/o breast cancer GERD Vitamin D defec        Hospital Course   Connie Osborne is a 55 y.o. female with a known history of Retention, allergy, history of breast cancer, GERD, history of vitamin D deficiency nicotine abuse presents with hemoptysis ongoing for the past 5 days. She has also had a cough associated with it. She has not had any fevers or chills. No wheezing. No difficulty with breathing. Patient came to the ER with these symptoms and was noted to have a CT scan that showed deficit over pneumonia and and a spiculated mass in the right upper lobe . Patient was seen by pulm Dr. Alva Garnet he feels this is related to lung cancer.  He will discuss this case at oncolgy round and get patient back to see him. He recommend discharge to home.              Consults  pulmonary/intensive care  Significant Tests:  See full reports for all details      Ct Chest W Contrast  06/30/2015  CLINICAL DATA:  From ED note "States she noticed some blood in sputum when she clears her throat .no fever or chills and denies any sore throat Lungs clear" Pt states coughing up blood x 1 week. No surgeries. Hx of breast ca. EXAM: CT CHEST WITH CONTRAST TECHNIQUE: Multidetector CT imaging of the chest was performed during intravenous contrast administration. CONTRAST:  64m ISOVUE-300 IOPAMIDOL (ISOVUE-300) INJECTION 61% COMPARISON:  Chest radiograph, 11/06/2012 FINDINGS: Neck base and axilla: No mass or adenopathy. Mildly heterogeneous thyroid gland likely from small ill-defined nodules. No thyroid enlargement. Mediastinum and hila: Heart normal in size and configuration. Great vessels  normal in caliber. Mild partly calcified plaque at the origin of the left subclavian artery. No stenosis. There are sub cm shotty mediastinal lymph nodes. No mediastinal masses. Mildly enlarged right hilar lymph nodes, largest measuring 13.5 mm in short axis. Lungs and pleura: There is a spiculated right upper lobe nodule measuring 13 x 10 x 12 mm. There is surrounding ground-glass opacity in the right upper lobe extending inferiorly to the minor fissure and bordering on the oblique fissure. There are no other suspicious lung nodules. Mild dependent subsegmental atelectasis is noted in the lower lobes. There are mild to moderate changes of centrilobular emphysema predominantly in the upper lobes. No pleural effusion. No pneumothorax. Limited upper abdomen: There is area of enhancement in the posterior segment of the right lobe of the liver at and just below the dome. No convincing liver mass. No adrenal masses. No acute findings. Musculoskeletal:  No osteoblastic or osteolytic lesions. IMPRESSION: 1. 13 mm spiculated nodule in the right upper lobe. This is suspicious for a lung carcinoma. Below is the new Fleischner criteria recommendation for a single nodule 9 mm or greater in size. However, given the shape of this nodule and the underlying emphysema, there is a significant likelihood that this is carcinoma. Tissue sampling should be strongly considered. Fleischner criteria: Consider one of the following in 3 months for both low-risk and high-risk individuals: (a) repeat chest CT, (b) follow-up PET-CT, or (c) tissue sampling. This recommendation follows the consensus statement: Guidelines for Management of Incidental Pulmonary  Nodules Detected on CT Images:From the Fleischner Society 2017; published online before print (10.1148/radiol.[id number]). 2. Ground-glass opacity is seen in the right upper lobe surround pulmonary nodule. This may be inflammatory or infectious in etiology. Consider superimposed pneumonia in  the proper clinical setting. Pulmonary hemorrhage could also have this appearance. 3. Mild right hilar adenopathy. This could be reactive or neoplastic. Electronically Signed   By: Lajean Manes M.D.   On: 06/30/2015 09:32       Today   Subjective:   Connie Osborne  Pt feels ok denies any complatins now has hemoptytissi  Objective:   Blood pressure 142/71, pulse 63, temperature 98.2 F (36.8 C), temperature source Oral, resp. rate 18, height '5\' 5"'$  (1.651 m), weight 68.04 kg (150 lb), SpO2 99 %.  . No intake or output data in the 24 hours ending 06/30/15 1537  Exam VITAL SIGNS: Blood pressure 142/71, pulse 63, temperature 98.2 F (36.8 C), temperature source Oral, resp. rate 18, height '5\' 5"'$  (1.651 m), weight 68.04 kg (150 lb), SpO2 99 %.  GENERAL:  55 y.o.-year-old patient lying in the bed with no acute distress.  EYES: Pupils equal, round, reactive to light and accommodation. No scleral icterus. Extraocular muscles intact.  HEENT: Head atraumatic, normocephalic. Oropharynx and nasopharynx clear.  NECK:  Supple, no jugular venous distention. No thyroid enlargement, no tenderness.  LUNGS: Normal breath sounds bilaterally, no wheezing, rales,rhonchi or crepitation. No use of accessory muscles of respiration.  CARDIOVASCULAR: S1, S2 normal. No murmurs, rubs, or gallops.  ABDOMEN: Soft, nontender, nondistended. Bowel sounds present. No organomegaly or mass.  EXTREMITIES: No pedal edema, cyanosis, or clubbing.  NEUROLOGIC: Cranial nerves II through XII are intact. Muscle strength 5/5 in all extremities. Sensation intact. Gait not checked.  PSYCHIATRIC: The patient is alert and oriented x 3.  SKIN: No obvious rash, lesion, or ulcer.   Data Review     CBC w Diff:  Lab Results  Component Value Date   WBC 8.4 06/30/2015   WBC 6.6 12/20/2013   HGB 12.7 06/30/2015   HGB 13.4 12/20/2013   HCT 36.2 06/30/2015   HCT 39.7 12/20/2013   PLT 296 06/30/2015   PLT 308 12/20/2013   LYMPHOPCT  29 06/30/2015   LYMPHOPCT 36.2 12/20/2013   MONOPCT 7 06/30/2015   MONOPCT 6.2 12/20/2013   EOSPCT 2 06/30/2015   EOSPCT 3.2 12/20/2013   BASOPCT 1 06/30/2015   BASOPCT 1.4 12/20/2013   CMP:  Lab Results  Component Value Date   NA 142 06/30/2015   NA 144 12/20/2013   K 3.4* 06/30/2015   K 3.2* 12/20/2013   CL 108 06/30/2015   CL 105 12/20/2013   CO2 26 06/30/2015   CO2 28 12/20/2013   BUN 11 06/30/2015   BUN 11 12/20/2013   CREATININE 0.61 06/30/2015   CREATININE 0.76 12/20/2013   PROT 7.2 12/20/2013   ALBUMIN 3.6 12/20/2013   BILITOT 0.2 12/20/2013   ALKPHOS 139* 12/20/2013   AST 14* 12/20/2013   ALT 18 12/20/2013  .  Micro Results No results found for this or any previous visit (from the past 240 hour(s)).      Code Status Orders        Start     Ordered   06/30/15 1053  Full code   Continuous     06/30/15 1053    Code Status History    Date Active Date Inactive Code Status Order ID Comments User Context   This patient has a current  code status but no historical code status.          Follow-up Information    Follow up with Merton Border, MD In 7 days.   Specialty:  Pulmonary Disease   Contact information:   [address] [place] [address] [phone]       Discharge Medications     Medication List    TAKE these medications        amLODipine 10 MG tablet  Commonly known as:  NORVASC  Take 10 mg by mouth daily.     buPROPion 150 MG 12 hr tablet  Commonly known as:  ZYBAN  Take 1 tablet (150 mg total) by mouth daily.     cetirizine 10 MG tablet  Commonly known as:  ZYRTEC  Take 10 mg by mouth as needed for allergies.     losartan 100 MG tablet  Commonly known as:  COZAAR  Take 100 mg by mouth daily.     metoprolol succinate 25 MG 24 hr tablet  Commonly known as:  TOPROL-XL  Take 25 mg by mouth daily.           Total Time in preparing paper work, data evaluation and todays exam - 35 minutes  Dustin Flock M.D on 06/30/2015 at 3:37 PM  [facility]   Office  [phone]

## 2015-06-30 NOTE — H&P (Signed)
[facility] [place] NAME: Connie Osborne    MR#:  [id number]  DATE OF BIRTH:  1960-03-28  DATE OF ADMISSION:  06/30/2015  PRIMARY CARE PHYSICIAN: Placido Sou, MD   REQUESTING/REFERRING PHYSICIAN: Devra Dopp MD  CHIEF COMPLAINT:   Chief Complaint  Patient presents with  . Cough    HISTORY OF PRESENT ILLNESS: Connie Osborne  is a 55 y.o. female with a known history of  Retention, allergy, history of breast cancer, GERD, history of vitamin D deficiency nicotine abuse presents with hemoptysis ongoing for the past 5 days. She has also had a cough associated with it. She has not had any fevers or chills. No wheezing. No difficulty with breathing. Patient came to the ER with these symptoms and was noted to have a CT scan that showed deficit over pneumonia and and a spiculated mass in the right upper lobe .     PAST MEDICAL HISTORY:   Past Medical History  Diagnosis Date  . Hypertension   . Allergy   . Breast cancer ([place])   . GERD (gastroesophageal reflux disease)   . Vitamin D deficiency     PAST SURGICAL HISTORY: Past Surgical History  Procedure Laterality Date  . Abdominal hysterectomy    . Foot surgery    . Ganglion cyst excision    . Fracture surgery    . Breast surgery Left 2014    lumpectomy    SOCIAL HISTORY:  Social History  Substance Use Topics  . Smoking status: Current Every Day Smoker  . Smokeless tobacco: Not on file  . Alcohol Use: No    FAMILY HISTORY:  Family History  Problem Relation Age of Onset  . Cancer Sister 52    breast  . Cancer Sister 31    breast  . Cancer Other     breast  . Lung cancer Brother     DRUG ALLERGIES:  Allergies  Allergen Reactions  . Accupril [Quinapril Hcl] Cough  . Percocet [Oxycodone-Acetaminophen]     REVIEW OF SYSTEMS:   CONSTITUTIONAL: No fever, fatigue or weakness. No weight loss EYES: No blurred or double vision.  EARS, NOSE, AND THROAT: No tinnitus or ear  pain.  RESPIRATORY: Positive cough, no shortness of breath, wheezing , and positive hemoptysis.  CARDIOVASCULAR: No chest pain, orthopnea, edema.  GASTROINTESTINAL: No nausea, vomiting, diarrhea or abdominal pain.  GENITOURINARY: No dysuria, hematuria.  ENDOCRINE: No polyuria, nocturia,  HEMATOLOGY: No anemia, easy bruising or bleeding SKIN: No rash or lesion. MUSCULOSKELETAL: No joint pain or arthritis.   NEUROLOGIC: No tingling, numbness, weakness.  PSYCHIATRY: No anxiety or depression.   MEDICATIONS AT HOME:  Prior to Admission medications   Medication Sig Start Date End Date Taking? Authorizing Provider  amLODipine (NORVASC) 10 MG tablet Take 10 mg by mouth daily.   Yes Historical Provider, MD  cetirizine (ZYRTEC) 10 MG tablet Take 10 mg by mouth as needed for allergies.   Yes Historical Provider, MD  losartan (COZAAR) 100 MG tablet Take 100 mg by mouth daily.   Yes Historical Provider, MD  metoprolol succinate (TOPROL-XL) 25 MG 24 hr tablet Take 25 mg by mouth daily.   Yes Historical Provider, MD      PHYSICAL EXAMINATION:   VITAL SIGNS: Blood pressure 167/87, pulse 59, temperature 97.6 F (36.4 C), temperature source Oral, resp. rate 18, height '5\' 5"'$  (1.651 m), weight 68.04 kg (150 lb), SpO2 97 %.  GENERAL:  55 y.o.-year-old patient  lying in the bed with no acute distress.  EYES: Pupils equal, round, reactive to light and accommodation. No scleral icterus. Extraocular muscles intact.  HEENT: Head atraumatic, normocephalic. Oropharynx and nasopharynx clear.  NECK:  Supple, no jugular venous distention. No thyroid enlargement, no tenderness.  LUNGS: Normal breath sounds bilaterally, no wheezing, rales,rhonchi or crepitation. No use of accessory muscles of respiration.  CARDIOVASCULAR: S1, S2 normal. No murmurs, rubs, or gallops.  ABDOMEN: Soft, nontender, nondistended. Bowel sounds present. No organomegaly or mass.  EXTREMITIES: No pedal edema, cyanosis, or clubbing.   NEUROLOGIC: Cranial nerves II through XII are intact. Muscle strength 5/5 in all extremities. Sensation intact. Gait not checked.  PSYCHIATRIC: The patient is alert and oriented x 3.  SKIN: No obvious rash, lesion, or ulcer.   LABORATORY PANEL:   CBC  Recent Labs Lab 06/30/15 0807  WBC 8.4  HGB 12.7  HCT 36.2  PLT 296  MCV 93.8  MCH 32.8  MCHC 35.0  RDW 13.3  LYMPHSABS 2.4  MONOABS 0.6  EOSABS 0.2  BASOSABS 0.1   ------------------------------------------------------------------------------------------------------------------  Chemistries   Recent Labs Lab 06/30/15 0807  NA 142  K 3.4*  CL 108  CO2 26  GLUCOSE 128*  BUN 11  CREATININE 0.61  CALCIUM 9.6   ------------------------------------------------------------------------------------------------------------------ estimated creatinine clearance is 71.5 mL/min (by C-G formula based on Cr of 0.61). ------------------------------------------------------------------------------------------------------------------ No results for input(s): TSH, T4TOTAL, T3FREE, THYROIDAB in the last 72 hours.  Invalid input(s): FREET3   Coagulation profile No results for input(s): INR, PROTIME in the last 168 hours. ------------------------------------------------------------------------------------------------------------------- No results for input(s): DDIMER in the last 72 hours. -------------------------------------------------------------------------------------------------------------------  Cardiac Enzymes No results for input(s): CKMB, TROPONINI, MYOGLOBIN in the last 168 hours.  Invalid input(s): CK ------------------------------------------------------------------------------------------------------------------ Invalid input(s): POCBNP  ---------------------------------------------------------------------------------------------------------------  Urinalysis No results found for: COLORURINE, APPEARANCEUR,  LABSPEC, PHURINE, GLUCOSEU, HGBUR, BILIRUBINUR, KETONESUR, PROTEINUR, UROBILINOGEN, NITRITE, LEUKOCYTESUR   RADIOLOGY: Ct Chest W Contrast  06/30/2015  CLINICAL DATA:  From ED note "States she noticed some blood in sputum when she clears her throat .no fever or chills and denies any sore throat Lungs clear" Pt states coughing up blood x 1 week. No surgeries. Hx of breast ca. EXAM: CT CHEST WITH CONTRAST TECHNIQUE: Multidetector CT imaging of the chest was performed during intravenous contrast administration. CONTRAST:  69m ISOVUE-300 IOPAMIDOL (ISOVUE-300) INJECTION 61% COMPARISON:  Chest radiograph, 11/06/2012 FINDINGS: Neck base and axilla: No mass or adenopathy. Mildly heterogeneous thyroid gland likely from small ill-defined nodules. No thyroid enlargement. Mediastinum and hila: Heart normal in size and configuration. Great vessels normal in caliber. Mild partly calcified plaque at the origin of the left subclavian artery. No stenosis. There are sub cm shotty mediastinal lymph nodes. No mediastinal masses. Mildly enlarged right hilar lymph nodes, largest measuring 13.5 mm in short axis. Lungs and pleura: There is a spiculated right upper lobe nodule measuring 13 x 10 x 12 mm. There is surrounding ground-glass opacity in the right upper lobe extending inferiorly to the minor fissure and bordering on the oblique fissure. There are no other suspicious lung nodules. Mild dependent subsegmental atelectasis is noted in the lower lobes. There are mild to moderate changes of centrilobular emphysema predominantly in the upper lobes. No pleural effusion. No pneumothorax. Limited upper abdomen: There is area of enhancement in the posterior segment of the right lobe of the liver at and just below the dome. No convincing liver mass. No adrenal masses. No acute findings. Musculoskeletal:  No osteoblastic or osteolytic lesions. IMPRESSION:  1. 13 mm spiculated nodule in the right upper lobe. This is suspicious for a lung  carcinoma. Below is the new Fleischner criteria recommendation for a single nodule 9 mm or greater in size. However, given the shape of this nodule and the underlying emphysema, there is a significant likelihood that this is carcinoma. Tissue sampling should be strongly considered. Fleischner criteria: Consider one of the following in 3 months for both low-risk and high-risk individuals: (a) repeat chest CT, (b) follow-up PET-CT, or (c) tissue sampling. This recommendation follows the consensus statement: Guidelines for Management of Incidental Pulmonary Nodules Detected on CT Images:From the Fleischner Society 2017; published online before print (10.1148/radiol.7078675449). 2. Ground-glass opacity is seen in the right upper lobe surround pulmonary nodule. This may be inflammatory or infectious in etiology. Consider superimposed pneumonia in the proper clinical setting. Pulmonary hemorrhage could also have this appearance. 3. Mild right hilar adenopathy. This could be reactive or neoplastic. Electronically Signed   By: Lajean Manes M.D.   On: 06/30/2015 09:32    EKG: Orders placed or performed in visit on 11/06/12  . EKG 12-Lead    IMPRESSION AND PLAN: Patient is a 55 year old with history of nicotine use presents with hemoptysis   1. Hemoptysis likely related to right upper lobe mass. And possible pneumonia I have discussed the case with interventional radiology they reviewed the CT scan, they're recommending pulmonary evaluation and possible bronchoscopy due to inflammation noted on the CT scan. There is concern for pneumonia I will treated with IV Levaquin and IV steroids due to inflammation present there Pulmonary consult   2. Hypertension continue amlodipine, losartan and metoprolol  3. Nicotine abuse I have counseled patient regarding smoking cessation 4 minutes spent I recommended patient stop smoking I offered her nicotine patch she she reports that she wants to try bupropion which  one of her friends are taking I will start her on bupropion for smoking cessation   All the records are reviewed and case discussed with ED provider. Management plans discussed with the patient, family and they are in agreement.  CODE STATUS:    Code Status Orders        Start     Ordered   06/30/15 1053  Full code   Continuous     06/30/15 1053    Code Status History    Date Active Date Inactive Code Status Order ID Comments User Context   This patient has a current code status but no historical code status.       TOTAL TIME TAKING CARE OF THIS PATIENT:39mnutes   PDustin FlockM.D on 06/30/2015 at 11:25 AM  Between 7am to 6pm - Pager - [phone]  After 6pm go to [email or web address] - password [email or web address]  Office  [phone] CC: Primary care physician; KPlacido Sou MD

## 2015-06-30 NOTE — Progress Notes (Signed)
[place] at [place] was admitted to the Hospital on 06/30/2015 and Discharged  06/30/2015 and should be excused from work/school   for 3  days starting 06/30/2015 , may return to work/school without any restrictions.  Call Dustin Flock MD with questions.  Dustin Flock M.D on 06/30/2015,at 3:35 PM  [place] at [facility]  [phone]

## 2015-06-30 NOTE — Discharge Instructions (Signed)

## 2015-06-30 NOTE — ED Notes (Addendum)
States she noticed some blood in sputum when she clears her throat .Marland Kitchenno fever or chills and denies any sore throat. States sx's started about 1 week ago

## 2015-06-30 NOTE — Progress Notes (Signed)
Connie Osborne to be D/C'd Home per MD order.  Discussed with the patient and all questions fully answered.  VSS, Skin clean, dry and intact without evidence of skin break down, no evidence of skin tears noted. IV catheter discontinued intact. Site without signs and symptoms of complications. Dressing and pressure applied.  An After Visit Summary was printed and given to the patient. Patient received prescription.  D/c education completed with patient/family including follow up instructions, medication list, d/c activities limitations if indicated, with other d/c instructions as indicated by MD - patient able to verbalize understanding, all questions fully answered.   Patient instructed to return to ED, call 911, or call MD for any changes in condition.   Patient escorted via [place], and D/C home via private auto.  [name] 06/30/2015 3:55 PM

## 2015-06-30 NOTE — Consult Note (Signed)
PULMONARY CONSULT NOTE  Requesting MD/Service: [facility] Date of initial consultation: 06/30/15 Reason for consultation: Hemoptysis, lung nodule, smoker  HPI:  89 F smoker of one PPD who was admitted to hospitalist service on 06/30/15 with a five day history of scannt hemoptysis described as small amount of blood mixed in with her sputum .She reports no DOE, CP, fever, LE edema or calf tenderness. With this history, she underwent CT chest which is discussed below. Pulmonary medicine is asked to advise on next step in evaluation  Past Medical History  Diagnosis Date  . Hypertension   . Allergy   . Breast cancer ([place])   . GERD (gastroesophageal reflux disease)   . Vitamin D deficiency     Past Surgical History  Procedure Laterality Date  . Abdominal hysterectomy    . Foot surgery    . Ganglion cyst excision    . Fracture surgery    . Breast surgery Left 2014    lumpectomy    MEDICATIONS: I have reviewed all medications and confirmed regimen as documented  Social History   Social History  . Marital Status: Divorced    Spouse Name: N/A  . Number of Children: N/A  . Years of Education: N/A   Occupational History  . Not on file.   Social History Main Topics  . Smoking status: Current Every Day Smoker -- 1.00 packs/day for 30 years    Types: Cigarettes  . Smokeless tobacco: Not on file  . Alcohol Use: No  . Drug Use: No  . Sexual Activity: Not on file   Other Topics Concern  . Not on file   Social History Narrative    Family History  Problem Relation Age of Onset  . Cancer Sister 49    breast  . Cancer Sister 75    breast  . Cancer Other     breast  . Lung cancer Brother   . Hypertension Mother   . Diabetes Mellitus II Mother   . Cancer Mother     mets  . Cancer Father     long cancer    ROS: No fever, myalgias/arthralgias, unexplained weight loss or weight gain No new focal weakness or sensory deficits No otalgia, hearing loss, visual changes,  nasal and sinus symptoms, mouth and throat problems No neck pain or adenopathy No abdominal pain, N/V/D, diarrhea, change in bowel pattern No dysuria, change in urinary pattern No LE edema or calf tenderness   Filed Vitals:   06/30/15 1023 06/30/15 1139 06/30/15 1200 06/30/15 1519  BP: 167/87 162/87 150/83 142/71  Pulse: 59 78 58 63  Temp:   97.9 F (36.6 C) 98.2 F (36.8 C)  TempSrc:   Oral Oral  Resp: '18 20 18 18  '$ Height:      Weight:      SpO2: 97% 97% 100% 99%     EXAM:  Gen: WDWN, No overt respiratory distress HEENT: NCAT, sclera white, oropharynx normal Neck: Supple without LAN, thyromegaly, JVD Lungs: breath sounds full, percussion note normal, No adventitious sounds Cardiovascular: Normal rate, reg rhythm, no murmurs noted Abdomen: Soft, nontender, normal BS Ext: without clubbing, cyanosis, edema Neuro: CNs grossly intact, motor and sensory intact, DTRs symmetric Skin: Limited exam, no lesions noted  DATA:   BMP Latest Ref Rng 06/30/2015 12/20/2013 06/14/2013  Glucose 65 - 99 mg/dL 128(H) 108(H) 128(H)  BUN 6 - 20 mg/dL '11 11 12  '$ Creatinine 0.44 - 1.00 mg/dL 0.61 0.76 0.84  Sodium 135 - 145 mmol/L  142 144 145  Potassium 3.5 - 5.1 mmol/L 3.4(L) 3.2(L) 2.9(L)  Chloride 101 - 111 mmol/L 108 105 105  CO2 22 - 32 mmol/L 26 28 33(H)  Calcium 8.9 - 10.3 mg/dL 9.6 9.3 9.7    CBC Latest Ref Rng 06/30/2015 12/20/2013 06/14/2013  WBC 3.6 - 11.0 K/uL 8.4 6.6 7.9  Hemoglobin 12.0 - 16.0 g/dL 12.7 13.4 12.8  Hematocrit 35.0 - 47.0 % 36.2 39.7 38.1  Platelets 150 - 440 K/uL 296 308 304   CT chest 06/30/15:   There is a spiculated right upper lobe nodule measuring 13 x 10 x 12 mm. There is surrounding ground-glass opacity. Mildly enlarged right hilar lymph nodes, largest measuring 13.5 mm in short axis.  IMPRESSION:   1) Hemoptysis - likely location is RUL 2) spiculated lung nodule in RUL in a smoker. This is the likely source of hemoptysis. In a smoker, a nodule with this  appearance must be presumed to be malignant 3) RUL ground glass opacities - these likely represent pooling blood. The history is not at all suggestive of an infectious process   PLAN:  1) I will present her @ [facility] Thurs 05/02. The potential diagnostic strategies could include PET scan, ENB or straight to resection. She will be contacted re: next step in the diagnostic process 2) I counseled re: smoking cessation and she indicated a desire to quit. She has had a friend who successfully quit on bupropion and she wishes to try this medication 3) As she is in no distress and the hemoptysis is scant, She can be safely discharged to home at any time. I do not think she needs antibiotics upon discharge  I have discussed the above with Dr Neta Ehlers, MD PCCM service Mobile [phone] Pager [phone] 06/30/2015

## 2015-06-30 NOTE — ED Provider Notes (Signed)
[facility] Emergency Department Provider Note   ____________________________________________  Time seen: Approximately 8:00 AM  I have reviewed the triage vital signs and the nursing notes.   HISTORY  Chief Complaint Cough    HPI JOYCELINE Osborne is a 55 y.o. female presenting with hemoptysis for the past day. She says she has had a persistent cough for months however last night she noticed she began coughing up a scant amount of bright red blood. She has not noticed the cough worsening at any specific time of day, with activity, with or without food. Denies chest pain, vomiting, changes in bowel movements, palpitations, weight loss, calf pain, SOB, DOE or HA. Admits to some changes in vision though she has had extensive opthalmologic workup in the past, cramping in the toes and feet. Previous medical history significant for breast cancer treated with radiation and tamoxifen as well as a vague GI complaint several years ago with severe vomiting that resolved with diet changes. Patient is a current pack a day smoker and has been for 20 years.    Past Medical History  Diagnosis Date  . Hypertension   . Allergy   . Breast cancer ([place])   . GERD (gastroesophageal reflux disease)   . Vitamin D deficiency     Patient Active Problem List   Diagnosis Date Noted  . BRCA gene positive 03/26/2013  . 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 Abdominal hysterectomy    . Foot surgery    . Ganglion cyst excision    . Fracture surgery    . Breast surgery Left 2014    lumpectomy    Current Outpatient Rx  Name  Route  Sig  Dispense  Refill  . amLODipine (NORVASC) 10 MG tablet   Oral   Take 10 mg by mouth daily.         . Calcium 600-200 MG-UNIT per tablet   Oral   Take 1 tablet by mouth 2 (two) times daily.         . cetirizine (ZYRTEC) 10 MG tablet   Oral   Take 10 mg by mouth as needed for  allergies.         Marland Kitchen letrozole (FEMARA) 2.5 MG tablet   Oral   Take 2.5 mg by mouth daily.         Marland Kitchen losartan (COZAAR) 100 MG tablet   Oral   Take 100 mg by mouth daily.         Marland Kitchen losartan-hydrochlorothiazide (HYZAAR) 100-12.5 MG per tablet   Oral   Take 1 tablet by mouth daily.         . potassium chloride (K-DUR) 10 MEQ tablet   Oral   Take 10 mEq by mouth daily.         . ranitidine (ZANTAC) 150 MG tablet   Oral   Take 150 mg by mouth daily as needed for heartburn.         . tamoxifen (NOLVADEX) 20 MG tablet   Oral   Take 20 mg by mouth daily.         . Vitamin D, Ergocalciferol, (DRISDOL) 50000 UNITS CAPS capsule   Oral   Take 50,000 Units by mouth every 7 (seven) days.           Allergies Accupril and Percocet  Family History  Problem Relation Age of Onset  . Cancer Sister 74  breast  . Cancer Sister 11    breast  . Cancer Other     breast    Social History Social History  Substance Use Topics  . Smoking status: Current Every Day Smoker  . Smokeless tobacco: None  . Alcohol Use: No    Review of Systems  Constitutional: No fever/chills, no weight loss Eyes: No visual changes.  ENT: No sore throat. Cardiovascular: Denies chest pain or palpitations. Admits to substernal burning  Respiratory: Denies shortness of breath, DOE. Admits to cough with hemoptysis.  Gastrointestinal: No abdominal pain.  No nausea, no vomiting.  No diarrhea.  No constipation. Neurological: Negative for headaches, focal weakness or numbness. 10-point ROS otherwise negative.  ____________________________________________   PHYSICAL EXAM:  VITAL SIGNS: ED Triage Vitals  Enc Vitals Group     BP 06/30/15 0732 153/78 mmHg     Pulse Rate 06/30/15 0732 68     Resp 06/30/15 0732 20     Temp 06/30/15 0732 97.6 F (36.4 C)     Temp Source 06/30/15 0732 Oral     SpO2 06/30/15 0732 98 %     Weight 06/30/15 0732 150 lb (68.04 kg)     Height 06/30/15 0732 5'  5" (1.651 m)     Head Cir --      Peak Flow --      Pain Score --      Pain Loc --      Pain Edu? --      Excl. in [place]? --     Constitutional: Alert and oriented. Well appearing and in no acute distress. Eyes: Conjunctivae are normal. PERRL. EOMI. Bilateral nystagmus Head: Atraumatic. Nose: No congestion/rhinnorhea. Mouth/Throat: Mucous membranes are moist.  Oropharynx non-erythematous. Neck: No stridor.   Hematological/Lymphatic/Immunilogical: No cervical lymphadenopathy. Cardiovascular: Normal rate, regular rhythm. Grossly normal heart sounds.  Good peripheral circulation. Respiratory: Normal respiratory effort.  No retractions. Lungs CTAB. Mildly decreased breath sounds bilaterally.  Neurologic:  Normal speech and language. No gross focal neurologic deficits are appreciated. No gait instability. Skin:  Skin is warm, dry and intact. No rash noted. Psychiatric: Mood and affect are normal. Speech and behavior are normal.  ____________________________________________   LABS (all labs ordered are listed, but only abnormal results are displayed)  Labs Reviewed  BASIC METABOLIC PANEL - Abnormal; Notable for the following:    Potassium 3.4 (*)    Glucose, Bld 128 (*)    All other components within normal limits  CBC WITH DIFFERENTIAL/PLATELET   ____________________________________________  EKG    ____________________________________________  RADIOLOGY   IMPRESSION: 1. 13 mm spiculated nodule in the right upper lobe. This is suspicious for a lung carcinoma. Below is the new Fleischner criteria recommendation for a single nodule 9 mm or greater in size. However, given the shape of this nodule and the underlying emphysema, there is a significant likelihood that this is carcinoma. Tissue sampling should be strongly considered. Fleischner criteria: Consider one of the following in 3 months for both low-risk and high-risk individuals: (a) repeat chest CT, (b) follow-up PET-CT,  or (c) tissue sampling. This recommendation follows the consensus statement: Guidelines for Management of Incidental Pulmonary Nodules Detected on CT Images:From the Fleischner Society 2017; published online before print (10.1148/radiol.3825053976). 2. Ground-glass opacity is seen in the right upper lobe surround pulmonary nodule. This may be inflammatory or infectious in etiology. Consider superimposed pneumonia in the proper clinical setting. Pulmonary hemorrhage could also have this appearance. 3. Mild right hilar adenopathy. This could be  reactive or neoplastic.________________________________________   PROCEDURES  Procedure(s) performed: imaging, see procedure note(s).  Critical Care performed: No  ____________________________________________   INITIAL IMPRESSION / ASSESSMENT AND PLAN / ED COURSE  Pertinent labs & imaging results that were available during my care of the patient were reviewed by me and considered in my medical decision making (see chart for details).  55 yo female presenting with hemoptysis for the past day.  ____________________________________________   FINAL CLINICAL IMPRESSION(S) / ED DIAGNOSES  Final diagnoses:  Pulmonary nodule, right      NEW MEDICATIONS STARTED DURING THIS VISIT:  New Prescriptions   No medications on file     Note:  This document was prepared using Dragon voice recognition software and may include unintentional dictation errors.   Arlyss Repress, PA-C 06/30/15 [place], MD 06/30/15 [phone]

## 2015-06-30 NOTE — ED Notes (Signed)
Pt with cough and  Noticed a little blood tinged yesterday. Denies any other sx.

## 2015-07-13 ENCOUNTER — Encounter: Payer: Self-pay | Admitting: Internal Medicine

## 2015-07-13 ENCOUNTER — Ambulatory Visit (INDEPENDENT_AMBULATORY_CARE_PROVIDER_SITE_OTHER): Payer: BLUE CROSS/BLUE SHIELD | Admitting: Internal Medicine

## 2015-07-13 VITALS — BP 140/90 | HR 90 | Ht 65.0 in | Wt 149.0 lb

## 2015-07-13 DIAGNOSIS — R911 Solitary pulmonary nodule: Secondary | ICD-10-CM

## 2015-07-13 MED ORDER — PREDNISONE 20 MG PO TABS: 40.0000 mg | ORAL_TABLET | Freq: Every day | ORAL | Status: DC

## 2015-07-13 MED ORDER — ALBUTEROL SULFATE HFA 108 (90 BASE) MCG/ACT IN AERS: 2.0000 | INHALATION_SPRAY | RESPIRATORY_TRACT | Status: DC | PRN

## 2015-07-13 NOTE — Patient Instructions (Signed)
Pulmonary Nodule A pulmonary nodule is a small, round growth of tissue in the lung. Pulmonary nodules can range in size from less than 1/5 inch (4 mm) to a little bigger than an inch (25 mm). Most pulmonary nodules are detected when imaging tests of the lung are being performed for a different problem. Pulmonary nodules are usually not cancerous (benign). However, some pulmonary nodules are cancerous (malignant). Follow-up treatment or testing is based on the size of the pulmonary nodule and your risk of getting lung cancer.  CAUSES Benign pulmonary nodules can be caused by various things. Some of the causes include:   Bacterial, fungal, or viral infections. This is usually an old infection that is no longer active, but it can sometimes be a current, active infection.  A benign mass of tissue.  Inflammation from conditions such as rheumatoid arthritis.   Abnormal blood vessels in the lungs. Malignant pulmonary nodules can result from lung cancer or from cancers that spread to the lung from other places in the body. SIGNS AND SYMPTOMS Pulmonary nodules usually do not cause symptoms. DIAGNOSIS Most often, pulmonary nodules are found incidentally when an X-ray or CT scan is performed to look for some other problem in the lung area. To help determine whether a pulmonary nodule is benign or malignant, your health care provider will take a medical history and order a variety of tests. Tests done may include:   Blood tests.  A skin test called a tuberculin test. This test is used to determine if you have been exposed to the germ that causes tuberculosis.   Chest X-rays. If possible, a new X-ray may be compared with X-rays you have had in the past.   CT scan. This test shows smaller pulmonary nodules more clearly than an X-ray.   Positron emission tomography (PET) scan. In this test, a safe amount of a radioactive substance is injected into the bloodstream. Then, the scan takes a picture of  the pulmonary nodule. The radioactive substance is eliminated from your body in your urine.   Biopsy. A tiny piece of the pulmonary nodule is removed so it can be checked under a microscope. TREATMENT  Pulmonary nodules that are benign normally do not require any treatment because they usually do not cause symptoms or breathing problems. Your health care provider may want to monitor the pulmonary nodule through follow-up CT scans. The frequency of these CT scans will vary based on the size of the nodule and the risk factors for lung cancer. For example, CT scans will need to be done more frequently if the pulmonary nodule is larger and if you have a history of smoking and a family history of cancer. Further testing or biopsies may be done if any follow-up CT scan shows that the size of the pulmonary nodule has increased. HOME CARE INSTRUCTIONS  Only take over-the-counter or prescription medicines as directed by your health care provider.  Keep all follow-up appointments with your health care provider. SEEK MEDICAL CARE IF:  You have trouble breathing when you are active.   You feel sick or unusually tired.   You do not feel like eating.   You lose weight without trying to.   You develop chills or night sweats.  SEEK IMMEDIATE MEDICAL CARE IF:  You cannot catch your breath, or you begin wheezing.   You cannot stop coughing.   You cough up blood.   You become dizzy or feel like you are going to pass out.   You   have sudden chest pain.   You have a fever or persistent symptoms for more than 2-3 days.   You have a fever and your symptoms suddenly get worse. MAKE SURE YOU:  Understand these instructions.  Will watch your condition.  Will get help right away if you are not doing well or get worse.   This information is not intended to replace advice given to you by your health care provider. Make sure you discuss any questions you have with your health care  provider.   Document Released: 12/12/2008 Document Revised: 10/17/2012 Document Reviewed: 08/06/2012 Elsevier Interactive Patient Education 2016 [employer]  

## 2015-07-13 NOTE — Addendum Note (Signed)
Addended by: Oscar La R on: 07/13/2015 01:32 PM   Modules accepted: Orders, Medications

## 2015-07-13 NOTE — Progress Notes (Signed)
[place] [facility] Consultation     Date: 07/13/2015,   MRN# [id number] Connie Osborne 04/24/1960 Code Status:  Code Status History    Date Active Date Inactive Code Status Order ID Comments User Context   06/30/2015 10:53 AM 06/30/2015  7:46 PM Full Code [id number]  Dustin Flock, MD ED     [facility] day:'@LENGTHOFSTAYDAYS'$ @ Referring MD: '@ATDPROV'$ @     PCP:      AdmissionWeight: 149 lb (67.586 kg)                 CurrentWeight: 149 lb (67.586 kg)      CHIEF COMPLAINT:   hemoptysis   HISTORY OF PRESENT ILLNESS   55 yo AAF here for hospital follow up Patient had bright red blood tinged sputum, treated for RUL pneumonia CT chest shows RUL nodule as well  Patient has no infections at this time, still has residual cough, still coughing up dark blood tinged sputum  She has no fevers, chills, NVD. Has no chest pain and no SOB  Patient still smokes, 1 PPD for 35 years   Current Medication:   Current outpatient prescriptions:  .  amLODipine (NORVASC) 10 MG tablet, Take 10 mg by mouth daily., Disp: , Rfl:  .  buPROPion (ZYBAN) 150 MG 12 hr tablet, Take 1 tablet (150 mg total) by mouth daily., Disp: 60 tablet, Rfl: 1 .  cetirizine (ZYRTEC) 10 MG tablet, Take 10 mg by mouth as needed for allergies., Disp: , Rfl:  .  losartan (COZAAR) 100 MG tablet, Take 100 mg by mouth daily., Disp: , Rfl:  .  metoprolol succinate (TOPROL-XL) 25 MG 24 hr tablet, Take 25 mg by mouth daily., Disp: , Rfl:      ALLERGIES   Accupril and Percocet     REVIEW OF SYSTEMS   Review of Systems  Constitutional: Negative for fever, chills, weight loss and malaise/fatigue.  HENT: Negative for congestion and hearing loss.   Eyes: Negative for blurred vision and double vision.  Respiratory: Positive for cough and hemoptysis. Negative for sputum production, shortness of breath and wheezing.   Cardiovascular: Negative for chest pain, palpitations and orthopnea.  Gastrointestinal: Negative for  heartburn, nausea, vomiting and abdominal pain.  Genitourinary: Negative for dysuria and urgency.  Musculoskeletal: Negative for myalgias.  Skin: Negative for rash.  Neurological: Negative for dizziness and headaches.  Endo/Heme/Allergies: Does not bruise/bleed easily.  Psychiatric/Behavioral: The patient is nervous/anxious.   All other systems reviewed and are negative.    VS: BP 140/90 mmHg  Pulse 90  Ht '5\' 5"'$  (1.651 m)  Wt 149 lb (67.586 kg)  BMI 24.79 kg/m2  SpO2 99%     PHYSICAL EXAM   Physical Exam  Constitutional: She is oriented to person, place, and time. She appears well-developed and well-nourished. No distress.  HENT:  Head: Normocephalic and atraumatic.  Mouth/Throat: No oropharyngeal exudate.  Eyes: EOM are normal. Pupils are equal, round, and reactive to light. No scleral icterus.  Neck: Normal range of motion. Neck supple.  Cardiovascular: Normal rate, regular rhythm and normal heart sounds.   No murmur heard. Pulmonary/Chest: No stridor. No respiratory distress. She has no wheezes. She has no rales.  Abdominal: Soft. Bowel sounds are normal.  Musculoskeletal: Normal range of motion. She exhibits no edema.  Neurological: She is alert and oriented to person, place, and time. No cranial nerve deficit.  Skin: Skin is warm. She is not diaphoretic.  Psychiatric: She has a normal mood and affect.  ASSESSMENT/PLAN   55 yo AAF with hemoptysis from RUL nodule/mass with underlying COPD  1.will prescribe prednisone 40 mg daily for 10 days 2.albuterol as needed 3.will set up for ENB ASAP 4.will need PFT's at some point 5.smoking cessation strongly advised    I have personally obtained a history, examined the patient, evaluated laboratory and independently reviewed imaging results, formulated the assessment and plan and placed orders.  The Patient requires high complexity decision making for assessment and support, frequent evaluation and titration  of therapies, application of advanced monitoring technologies and extensive interpretation of multiple databases.  Patient satisfied with Plan of action and management. All questions answered   Corrin Parker, M.D.  [facility]  Medical Director [place] Director [facility] Cardio-Pulmonary Department

## 2015-07-21 ENCOUNTER — Ambulatory Visit
Admission: RE | Admit: 2015-07-21 | Discharge: 2015-07-21 | Disposition: A | Discharge status: Home / Self Care | Payer: BLUE CROSS/BLUE SHIELD | Source: Ambulatory Visit | Attending: Internal Medicine | Admitting: Internal Medicine

## 2015-07-21 ENCOUNTER — Other Ambulatory Visit: Payer: Self-pay

## 2015-07-21 DIAGNOSIS — R042 Hemoptysis: Secondary | ICD-10-CM | POA: Diagnosis not present

## 2015-07-21 DIAGNOSIS — Z01810 Encounter for preprocedural cardiovascular examination: Secondary | ICD-10-CM | POA: Insufficient documentation

## 2015-07-21 DIAGNOSIS — R918 Other nonspecific abnormal finding of lung field: Secondary | ICD-10-CM | POA: Diagnosis not present

## 2015-07-21 DIAGNOSIS — J449 Chronic obstructive pulmonary disease, unspecified: Secondary | ICD-10-CM | POA: Diagnosis not present

## 2015-07-21 HISTORY — DX: Hemoptysis: R04.2

## 2015-07-21 HISTORY — DX: Reserved for inherently not codable concepts without codable children: IMO0001

## 2015-07-21 HISTORY — DX: Chronic obstructive pulmonary disease, unspecified: J44.9

## 2015-07-21 HISTORY — DX: Unspecified osteoarthritis, unspecified site: M19.90

## 2015-07-21 NOTE — Patient Instructions (Signed)
  Your procedure is scheduled on: 5/30/17Tues Report to Same Day Surgery 2nd floor medical mall To find out your arrival time please call [phone] between 1PM - 3PM on 07/27/15 Mon  Remember: Instructions that are not followed completely may result in serious medical risk, up to and including death, or upon the discretion of your surgeon and anesthesiologist your surgery may need to be rescheduled.    _x___ 1. Do not eat food or drink liquids after midnight. No gum chewing or hard candies.     ____ 2. No Alcohol for 24 hours before or after surgery.   ____ 3. Bring all medications with you on the day of surgery if instructed.    __x__ 4. Notify your doctor if there is any change in your medical condition     (cold, fever, infections).     Do not wear jewelry, make-up, hairpins, clips or nail polish.  Do not wear lotions, powders, or perfumes. You may wear deodorant.  Do not shave 48 hours prior to surgery. Men may shave face and neck.  Do not bring valuables to the hospital.    [facility] is not responsible for any belongings or valuables.               Contacts, dentures or bridgework may not be worn into surgery.  Leave your suitcase in the car. After surgery it may be brought to your room.  For patients admitted to the hospital, discharge time is determined by your treatment team.   Patients discharged the day of surgery will not be allowed to drive home.    Please read over the following fact sheets that you were given:   [facility] and or MRSA Information   _x___ Take these medicines the morning of surgery with A SIP OF WATER:    1. albuterol (PROVENTIL HFA;VENTOLIN HFA) 108 (90 Base) MCG/ACT inhaler  2.amLODipine (NORVASC) 10 MG tablet  3.losartan (COZAAR) 100 MG tablet  4.metoprolol succinate (TOPROL-XL) 25 MG 24 hr tablet  5.predniSONE (DELTASONE) 20 MG tablet  6.  ____ Fleet Enema (as directed)   _x___ Use CHG Soap or sage wipes as  directed on instruction sheet   ____ Use inhalers on the day of surgery and bring to hospital day of surgery  ____ Stop metformin 2 days prior to surgery    ____ Take 1/2 of usual insulin dose the night before surgery and none on the morning of           surgery.   ____ Stop aspirin or coumadin, or plavix  _x__ Stop Anti-inflammatories such as Advil, Aleve, Ibuprofen, Motrin, Naproxen,          Naprosyn, Goodies powders or aspirin products. Ok to take Tylenol.   ____ Stop supplements until after surgery.    ____ Bring C-Pap to the hospital.

## 2015-07-28 ENCOUNTER — Encounter: Payer: Self-pay | Admitting: *Deleted

## 2015-07-28 ENCOUNTER — Ambulatory Visit: Payer: BLUE CROSS/BLUE SHIELD | Admitting: Anesthesiology

## 2015-07-28 ENCOUNTER — Encounter
Admission: RE | Disposition: A | Discharge status: Home / Self Care | Payer: Self-pay | Source: Ambulatory Visit | Attending: Internal Medicine

## 2015-07-28 ENCOUNTER — Ambulatory Visit
Admission: RE | Admit: 2015-07-28 | Discharge: 2015-07-28 | Disposition: A | Discharge status: Home / Self Care | Payer: BLUE CROSS/BLUE SHIELD | Source: Ambulatory Visit | Attending: Internal Medicine | Admitting: Internal Medicine

## 2015-07-28 ENCOUNTER — Ambulatory Visit: Payer: BLUE CROSS/BLUE SHIELD

## 2015-07-28 DIAGNOSIS — E559 Vitamin D deficiency, unspecified: Secondary | ICD-10-CM | POA: Insufficient documentation

## 2015-07-28 DIAGNOSIS — F17210 Nicotine dependence, cigarettes, uncomplicated: Secondary | ICD-10-CM | POA: Insufficient documentation

## 2015-07-28 DIAGNOSIS — K219 Gastro-esophageal reflux disease without esophagitis: Secondary | ICD-10-CM | POA: Insufficient documentation

## 2015-07-28 DIAGNOSIS — Z79899 Other long term (current) drug therapy: Secondary | ICD-10-CM | POA: Diagnosis not present

## 2015-07-28 DIAGNOSIS — C3411 Malignant neoplasm of upper lobe, right bronchus or lung: Secondary | ICD-10-CM | POA: Insufficient documentation

## 2015-07-28 DIAGNOSIS — M1990 Unspecified osteoarthritis, unspecified site: Secondary | ICD-10-CM | POA: Insufficient documentation

## 2015-07-28 DIAGNOSIS — I10 Essential (primary) hypertension: Secondary | ICD-10-CM | POA: Diagnosis not present

## 2015-07-28 DIAGNOSIS — Z853 Personal history of malignant neoplasm of breast: Secondary | ICD-10-CM | POA: Diagnosis not present

## 2015-07-28 DIAGNOSIS — R918 Other nonspecific abnormal finding of lung field: Secondary | ICD-10-CM | POA: Insufficient documentation

## 2015-07-28 HISTORY — PX: ELECTROMAGNETIC NAVIGATION BROCHOSCOPY: SHX5369

## 2015-07-28 SURGERY — ELECTROMAGNETIC NAVIGATION BRONCHOSCOPY
Anesthesia: General | Laterality: Right

## 2015-07-28 MED ORDER — DEXAMETHASONE SODIUM PHOSPHATE 10 MG/ML IJ SOLN
INTRAMUSCULAR | Status: DC | PRN
  Administered 2015-07-28: 5 mg via INTRAVENOUS

## 2015-07-28 MED ORDER — LIDOCAINE HCL (CARDIAC) 20 MG/ML IV SOLN
INTRAVENOUS | Status: DC | PRN
  Administered 2015-07-28: 60 mg via INTRAVENOUS

## 2015-07-28 MED ORDER — LACTATED RINGERS IV SOLN
INTRAVENOUS | Status: DC
  Administered 2015-07-28 (×2): via INTRAVENOUS

## 2015-07-28 MED ORDER — IPRATROPIUM-ALBUTEROL 0.5-2.5 (3) MG/3ML IN SOLN
RESPIRATORY_TRACT | Status: AC
  Administered 2015-07-28: 3 mL via RESPIRATORY_TRACT
  Filled 2015-07-28: qty 3

## 2015-07-28 MED ORDER — FAMOTIDINE 20 MG PO TABS
20.0000 mg | ORAL_TABLET | Freq: Once | ORAL | Status: AC
  Administered 2015-07-28: 20 mg via ORAL

## 2015-07-28 MED ORDER — ONDANSETRON HCL 4 MG/2ML IJ SOLN
INTRAMUSCULAR | Status: DC | PRN
  Administered 2015-07-28: 4 mg via INTRAVENOUS

## 2015-07-28 MED ORDER — IPRATROPIUM-ALBUTEROL 0.5-2.5 (3) MG/3ML IN SOLN
3.0000 mL | Freq: Once | RESPIRATORY_TRACT | Status: AC
  Administered 2015-07-28: 3 mL via RESPIRATORY_TRACT

## 2015-07-28 MED ORDER — PHENYLEPHRINE HCL 10 MG/ML IJ SOLN
INTRAMUSCULAR | Status: DC | PRN
  Administered 2015-07-28 (×3): 100 ug via INTRAVENOUS

## 2015-07-28 MED ORDER — FENTANYL CITRATE (PF) 100 MCG/2ML IJ SOLN: 25.0000 ug | INTRAMUSCULAR | Status: DC | PRN

## 2015-07-28 MED ORDER — IPRATROPIUM-ALBUTEROL 0.5-2.5 (3) MG/3ML IN SOLN
RESPIRATORY_TRACT | Status: AC
  Filled 2015-07-28: qty 3

## 2015-07-28 MED ORDER — FENTANYL CITRATE (PF) 100 MCG/2ML IJ SOLN
INTRAMUSCULAR | Status: DC | PRN
  Administered 2015-07-28: 100 ug via INTRAVENOUS

## 2015-07-28 MED ORDER — FAMOTIDINE 20 MG PO TABS
ORAL_TABLET | ORAL | Status: AC
  Administered 2015-07-28: 20 mg via ORAL
  Filled 2015-07-28: qty 1

## 2015-07-28 MED ORDER — SUGAMMADEX SODIUM 200 MG/2ML IV SOLN
INTRAVENOUS | Status: DC | PRN
  Administered 2015-07-28: 140 mg via INTRAVENOUS

## 2015-07-28 MED ORDER — PROPOFOL 10 MG/ML IV BOLUS
INTRAVENOUS | Status: DC | PRN
  Administered 2015-07-28: 150 mg via INTRAVENOUS

## 2015-07-28 MED ORDER — IPRATROPIUM-ALBUTEROL 0.5-2.5 (3) MG/3ML IN SOLN
3.0000 mL | Freq: Once | RESPIRATORY_TRACT | Status: DC | PRN
  Administered 2015-07-28: 3 mL via RESPIRATORY_TRACT

## 2015-07-28 MED ORDER — MIDAZOLAM HCL 5 MG/5ML IJ SOLN
INTRAMUSCULAR | Status: DC | PRN
  Administered 2015-07-28: 2 mg via INTRAVENOUS

## 2015-07-28 MED ORDER — ROCURONIUM BROMIDE 100 MG/10ML IV SOLN
INTRAVENOUS | Status: DC | PRN
  Administered 2015-07-28: 30 mg via INTRAVENOUS

## 2015-07-28 NOTE — Transfer of Care (Signed)
Immediate Anesthesia Transfer of Care Note  Patient: Connie Osborne  Procedure(s) Performed: Procedure(s): ELECTROMAGNETIC NAVIGATION BRONCHOSCOPY (Right)  Patient Location: PACU  Anesthesia Type:General  Level of Consciousness: awake and patient cooperative  Airway & Oxygen Therapy: Patient Spontanous Breathing and Patient connected to face mask oxygen  Post-op Assessment: Report given to RN  Post vital signs: Reviewed and stable  Last Vitals:  Filed Vitals:   07/28/15 1152 07/28/15 1406  BP: 133/78 145/75  Pulse: 68 78  Temp: 36 C 36.6 C  Resp: 18 11    Last Pain: There were no vitals filed for this visit.       Complications: No apparent anesthesia complications

## 2015-07-28 NOTE — Anesthesia Procedure Notes (Signed)
Procedure Name: Intubation Date/Time: 07/28/2015 1:14 PM Performed by: Dionne Bucy Pre-anesthesia Checklist: Patient identified, Patient being monitored, Timeout performed, Emergency Drugs available and Suction available Patient Re-evaluated:Patient Re-evaluated prior to inductionOxygen Delivery Method: Circle system utilized Preoxygenation: Pre-oxygenation with 100% oxygen Intubation Type: IV induction Ventilation: Mask ventilation without difficulty Laryngoscope Size: Mac and 3 Grade View: Grade II Tube type: Oral Tube size: 8.5 mm Number of attempts: 2 (DL x1 attempted to place 9 ETT but unable to pass glotic opening; DL x2  8.5 ETT placed without problems + ETCO2 and Bilat equal breath sounds ) Airway Equipment and Method: Stylet Placement Confirmation: ETT inserted through vocal cords under direct vision,  positive ETCO2 and breath sounds checked- equal and bilateral Secured at: 22 cm Tube secured with: Tape Dental Injury: Teeth and Oropharynx as per pre-operative assessment

## 2015-07-28 NOTE — H&P (View-Only) (Signed)
[place] [facility] Consultation     Date: 07/13/2015,   MRN# [id number] Connie Osborne [birth year] Code Status:  Code Status History    Date Active Date Inactive Code Status Order ID Comments User Context   06/30/2015 10:53 AM 06/30/2015  7:46 PM Full Code [id number]  Dustin Flock, MD ED     Hosp day:'@LENGTHOFSTAYDAYS'$ @ Referring MD: '@ATDPROV'$ @     PCP:      AdmissionWeight: 149 lb (67.586 kg)                 CurrentWeight: 149 lb (67.586 kg)      CHIEF COMPLAINT:   hemoptysis   HISTORY OF PRESENT ILLNESS   55 yo AAF here for hospital follow up Patient had bright red blood tinged sputum, treated for RUL pneumonia CT chest shows RUL nodule as well  Patient has no infections at this time, still has residual cough, still coughing up dark blood tinged sputum  She has no fevers, chills, NVD. Has no chest pain and no SOB  Patient still smokes, 1 PPD for 35 years   Current Medication:   Current outpatient prescriptions:  .  amLODipine (NORVASC) 10 MG tablet, Take 10 mg by mouth daily., Disp: , Rfl:  .  buPROPion (ZYBAN) 150 MG 12 hr tablet, Take 1 tablet (150 mg total) by mouth daily., Disp: 60 tablet, Rfl: 1 .  cetirizine (ZYRTEC) 10 MG tablet, Take 10 mg by mouth as needed for allergies., Disp: , Rfl:  .  losartan (COZAAR) 100 MG tablet, Take 100 mg by mouth daily., Disp: , Rfl:  .  metoprolol succinate (TOPROL-XL) 25 MG 24 hr tablet, Take 25 mg by mouth daily., Disp: , Rfl:      ALLERGIES   Accupril and Percocet     REVIEW OF SYSTEMS   Review of Systems  Constitutional: Negative for fever, chills, weight loss and malaise/fatigue.  HENT: Negative for congestion and hearing loss.   Eyes: Negative for blurred vision and double vision.  Respiratory: Positive for cough and hemoptysis. Negative for sputum production, shortness of breath and wheezing.   Cardiovascular: Negative for chest pain, palpitations and orthopnea.  Gastrointestinal: Negative for  heartburn, nausea, vomiting and abdominal pain.  Genitourinary: Negative for dysuria and urgency.  Musculoskeletal: Negative for myalgias.  Skin: Negative for rash.  Neurological: Negative for dizziness and headaches.  Endo/Heme/Allergies: Does not bruise/bleed easily.  Psychiatric/Behavioral: The patient is nervous/anxious.   All other systems reviewed and are negative.    VS: BP 140/90 mmHg  Pulse 90  Ht '5\' 5"'$  (1.651 m)  Wt 149 lb (67.586 kg)  BMI 24.79 kg/m2  SpO2 99%     PHYSICAL EXAM   Physical Exam  Constitutional: She is oriented to person, place, and time. She appears well-developed and well-nourished. No distress.  HENT:  Head: Normocephalic and atraumatic.  Mouth/Throat: No oropharyngeal exudate.  Eyes: EOM are normal. Pupils are equal, round, and reactive to light. No scleral icterus.  Neck: Normal range of motion. Neck supple.  Cardiovascular: Normal rate, regular rhythm and normal heart sounds.   No murmur heard. Pulmonary/Chest: No stridor. No respiratory distress. She has no wheezes. She has no rales.  Abdominal: Soft. Bowel sounds are normal.  Musculoskeletal: Normal range of motion. She exhibits no edema.  Neurological: She is alert and oriented to person, place, and time. No cranial nerve deficit.  Skin: Skin is warm. She is not diaphoretic.  Psychiatric: She has a normal mood and affect.  ASSESSMENT/PLAN   55 yo AAF with hemoptysis from RUL nodule/mass with underlying COPD  1.will prescribe prednisone 40 mg daily for 10 days 2.albuterol as needed 3.will set up for ENB ASAP 4.will need PFT's at some point 5.smoking cessation strongly advised    I have personally obtained a history, examined the patient, evaluated laboratory and independently reviewed imaging results, formulated the assessment and plan and placed orders.  The Patient requires high complexity decision making for assessment and support, frequent evaluation and titration  of therapies, application of advanced monitoring technologies and extensive interpretation of multiple databases.  Patient satisfied with Plan of action and management. All questions answered   Corrin Parker, M.D.  [facility]  Medical Director [place] Director [facility] Cardio-Pulmonary Department

## 2015-07-28 NOTE — Discharge Instructions (Signed)
Flexible Bronchoscopy, Care After Refer to this sheet in the next few weeks. These instructions provide you with information on caring for yourself after your procedure. Your health care provider may also give you more specific instructions. Your treatment has been planned according to current medical practices, but problems sometimes occur. Call your health care provider if you have any problems or questions after your procedure.  WHAT TO EXPECT AFTER THE PROCEDURE It is normal to have the following symptoms for 24-48 hours after the procedure:   Increased cough.  Low-grade fever.  Sore throat or hoarse voice.  Small streaks of blood in your thick spit (sputum) if tissue samples were taken (biopsy). HOME CARE INSTRUCTIONS   Do not eat or drink anything for 2 hours after your procedure. Your nose and throat were numbed by medicine. If you try to eat or drink before the medicine wears off, food or drink could go into your lungs or you could burn yourself. After the numbness is gone and your cough and gag reflexes have returned, you may eat soft food and drink liquids slowly.   The day after the procedure, you can go back to your normal diet.   You may resume normal activities.   Keep all follow-up visits as directed by your health care provider. It is important to keep all your appointments, especially if tissue samples were taken for testing (biopsy). SEEK IMMEDIATE MEDICAL CARE IF:   You have increasing shortness of breath.   You become light-headed or faint.   You have chest pain.   You have any new concerning symptoms.  You cough up more than a small amount of blood.  The amount of blood you cough up increases. MAKE SURE YOU:  Understand these instructions.  Will watch your condition.  Will get help right away if you are not doing well or get worse.   This information is not intended to replace advice given to you by your health care provider. Make sure you discuss  any questions you have with your health care provider.   Document Released: 09/03/2004 Document Revised: 03/07/2014 Document Reviewed: 10/19/2012 Elsevier Interactive Patient Education 2016 [place]   1) The drugs that you were given will stay in your system until tomorrow so for the next 24 hours you should not:  A) Drive an automobile B) Make any legal decisions C) Drink any alcoholic beverage   2) You may resume regular meals tomorrow.  Today it is better to start with liquids and gradually work up to solid foods.  You may eat anything you prefer, but it is better to start with liquids, then soup and crackers, and gradually work up to solid foods.   3) Please notify your doctor immediately if you have any unusual bleeding, trouble breathing, redness and pain at the surgery site, drainage, fever, or pain not relieved by medication.    4) Additional Instructions:   Please contact your physician with any problems or Same Day Surgery at [phone], Monday through Friday 6 am to 4 pm, or  at [facility] number at [phone].

## 2015-07-28 NOTE — Anesthesia Preprocedure Evaluation (Addendum)
Anesthesia Evaluation  Patient identified by MRN, date of birth, ID band Patient awake    Reviewed: Allergy & Precautions, H&P , NPO status , Patient's Chart, lab work & pertinent test results  Airway Mallampati: III  TM Distance: >3 FB Neck ROM: full    Dental  (+) Poor Dentition, Chipped   Pulmonary shortness of breath and with exertion, COPD, Current Smoker,    Pulmonary exam normal breath sounds clear to auscultation       Cardiovascular Exercise Tolerance: Good hypertension, (-) angina+ DOE  (-) Past MI Normal cardiovascular exam Rhythm:regular Rate:Normal     Neuro/Psych negative neurological ROS  negative psych ROS   GI/Hepatic Neg liver ROS, GERD  Controlled,  Endo/Other  negative endocrine ROS  Renal/GU negative Renal ROS  negative genitourinary   Musculoskeletal  (+) Arthritis ,   Abdominal   Peds  Hematology negative hematology ROS (+)   Anesthesia Other Findings Nystagmus     Past Medical History:   Hypertension                                                 Allergy                                                      GERD (gastroesophageal reflux disease)                       Vitamin D deficiency                                         Shortness of breath dyspnea                                  Coughing up blood                                            Breast cancer (HCC)                                          Arthritis                                                    COPD (chronic obstructive pulmonary disease) (*             Past Surgical History:   ABDOMINAL HYSTERECTOMY                                        FOOT SURGERY  GANGLION CYST EXCISION                                        FRACTURE SURGERY                                              BREAST SURGERY                                  Left 2014            Comment:lumpectomy  BMI    Body Mass Index   24.96 kg/m 2      Reproductive/Obstetrics negative OB ROS                            Anesthesia Physical Anesthesia Plan  ASA: III  Anesthesia Plan: General ETT   Post-op Pain Management:    Induction:   Airway Management Planned:   Additional Equipment:   Intra-op Plan:   Post-operative Plan:   Informed Consent: I have reviewed the patients History and Physical, chart, labs and discussed the procedure including the risks, benefits and alternatives for the proposed anesthesia with the patient or authorized representative who has indicated his/her understanding and acceptance.   Dental Advisory Given  Plan Discussed with: Anesthesiologist, CRNA and Surgeon  Anesthesia Plan Comments:         Anesthesia Quick Evaluation

## 2015-07-28 NOTE — Interval H&P Note (Signed)
History and Physical Interval Note:  07/28/2015 12:54 PM  Connie Osborne  has presented today for surgery, with the diagnosis of RUL LUNG MASS  The various methods of treatment have been discussed with the patient and family. After consideration of risks, benefits and other options for treatment, the patient has consented to  Procedure(s): ELECTROMAGNETIC NAVIGATION BRONCHOSCOPY (Right) as a surgical intervention .  The patient's history has been reviewed, patient examined, no change in status, stable for surgery.  I have reviewed the patient's chart and labs.  Questions were answered to the patient's satisfaction.     Flora Lipps

## 2015-07-28 NOTE — Op Note (Signed)
Electromagnetic Navigation Bronchoscopy: Indication: lung mass  Preoperative Diagnosis:lung mass Post Procedure Diagnosis: lung mass Consent: verbal/written Risks and benefits explained in detail including risk of infection, bleeding, respiratory failure and death.   Hand washing performed prior to starting the procedure.   Type of Anesthesia: see Anesthesiology records .   Procedure Performed:  Virtual Bronchoscopy with Multi-planar Image analysis, 3-D reconstruction of coronal, sagittal and multi-planar images for the purposes of planning real-time bronchoscopy using the iLogic Electromagnetic Navigation Bronchoscopy System (superDimension)..   Description of Procedure: After obtaining informed consent from the patient, the above sedative and anesthetic measures were carried out, flexible fiberoptic bronchoscope was inserted via an oral bite block. Posterior pharynx was clear. The 2 vocal cords were easily traversed after application of local anesthetic.  The virtual camera was then placed into the central portion of the trachea. The trachea itself was inspected.  The main carina, right and left midstem bronchus and all the segmental and subsegmental airways by virtual bronchoscopy were brieftly inspected.  The camera was directed to standard registration points at the following centers: main carina, right upper lobe bronchus, right lower lobe bronchus, right middle lobe bronchus, left upper lobe bronchus, and the left lower lobe bronchus. This data was transferred to the i-Logic ENB system for real-time bronchoscopy.   The scope was navigated to RUL and specimens obtained as listed below. There was evidence bleedng coming from RUL  Specimans Obtained:  Fine Needle Aspirations 21G times:4 Forceps Biopsy times:2   Fluoroscopy:  Fluoroscopy was utilized during the course of this procedure to assure that biopsies were taken in a safe manner under fluoroscopic guidance with no spot films  required.   Complications:none  Estimated Blood Loss: none  Monitoring:  The patient was monitored with continuous oximetry and received supplemental nasal cannula oxygen throughout the procedure. In addition, serial blood pressure measurements and continuous electrocardiography showed these physiologic parameters to remain tolerable throughout the procedure.   Assessment and Plan/Additional Comments:follow up path reports    Corrin Parker, M.D.  [facility]  Medical Director [place] Director [facility] Cardio-Pulmonary Department

## 2015-07-29 ENCOUNTER — Encounter: Payer: Self-pay | Admitting: Internal Medicine

## 2015-07-29 NOTE — Anesthesia Postprocedure Evaluation (Signed)
Anesthesia Post Note  Patient: Connie Osborne  Procedure(s) Performed: Procedure(s) (LRB): ELECTROMAGNETIC NAVIGATION BRONCHOSCOPY (Right)  Patient location during evaluation: PACU Anesthesia Type: General Level of consciousness: awake and alert Pain management: pain level controlled Vital Signs Assessment: post-procedure vital signs reviewed and stable Respiratory status: spontaneous breathing, nonlabored ventilation, respiratory function stable and patient connected to nasal cannula oxygen Cardiovascular status: blood pressure returned to baseline and stable Postop Assessment: no signs of nausea or vomiting Anesthetic complications: no    Last Vitals:  Filed Vitals:   07/28/15 1458 07/28/15 1530  BP: 130/68 155/80  Pulse: 67 65  Temp: 36.6 C   Resp: 16 16    Last Pain:  Filed Vitals:   07/28/15 1534  PainSc: 1                  Precious Haws Piscitello

## 2015-07-30 ENCOUNTER — Other Ambulatory Visit: Payer: Self-pay | Admitting: *Deleted

## 2015-07-30 DIAGNOSIS — R911 Solitary pulmonary nodule: Secondary | ICD-10-CM

## 2015-07-30 LAB — CYTOLOGY - NON PAP

## 2015-07-30 LAB — SURGICAL PATHOLOGY

## 2015-07-31 ENCOUNTER — Ambulatory Visit: Payer: Self-pay | Admitting: Cardiothoracic Surgery

## 2015-08-04 ENCOUNTER — Other Ambulatory Visit: Payer: Self-pay

## 2015-08-04 ENCOUNTER — Encounter: Payer: Self-pay | Admitting: Cardiothoracic Surgery

## 2015-08-04 ENCOUNTER — Ambulatory Visit: Payer: BLUE CROSS/BLUE SHIELD | Admitting: Pulmonary Disease

## 2015-08-04 ENCOUNTER — Encounter: Payer: Self-pay | Admitting: Pulmonary Disease

## 2015-08-04 ENCOUNTER — Ambulatory Visit (INDEPENDENT_AMBULATORY_CARE_PROVIDER_SITE_OTHER): Payer: BLUE CROSS/BLUE SHIELD | Admitting: Pulmonary Disease

## 2015-08-04 ENCOUNTER — Ambulatory Visit (INDEPENDENT_AMBULATORY_CARE_PROVIDER_SITE_OTHER): Payer: BLUE CROSS/BLUE SHIELD | Admitting: Cardiothoracic Surgery

## 2015-08-04 VITALS — BP 148/89 | HR 64 | Temp 98.0°F | Ht 65.0 in | Wt 151.2 lb

## 2015-08-04 VITALS — BP 128/72 | HR 66 | Ht 65.0 in | Wt 151.0 lb

## 2015-08-04 DIAGNOSIS — J410 Simple chronic bronchitis: Secondary | ICD-10-CM | POA: Diagnosis not present

## 2015-08-04 DIAGNOSIS — F17200 Nicotine dependence, unspecified, uncomplicated: Secondary | ICD-10-CM

## 2015-08-04 DIAGNOSIS — Z720 Tobacco use: Secondary | ICD-10-CM

## 2015-08-04 DIAGNOSIS — R042 Hemoptysis: Secondary | ICD-10-CM | POA: Diagnosis not present

## 2015-08-04 DIAGNOSIS — J984 Other disorders of lung: Secondary | ICD-10-CM | POA: Diagnosis not present

## 2015-08-04 DIAGNOSIS — C3411 Malignant neoplasm of upper lobe, right bronchus or lung: Secondary | ICD-10-CM | POA: Diagnosis not present

## 2015-08-04 MED ORDER — UMECLIDINIUM-VILANTEROL 62.5-25 MCG/INH IN AEPB: 1.0000 | INHALATION_SPRAY | Freq: Every day | RESPIRATORY_TRACT | Status: DC

## 2015-08-04 MED ORDER — NICOTINE 14 MG/24HR TD PT24: 14.0000 mg | MEDICATED_PATCH | Freq: Every day | TRANSDERMAL | Status: DC

## 2015-08-04 MED ORDER — UMECLIDINIUM-VILANTEROL 62.5-25 MCG/INH IN AEPB: 1.0000 | INHALATION_SPRAY | Freq: Every day | RESPIRATORY_TRACT | Status: AC

## 2015-08-04 NOTE — Progress Notes (Signed)
Patient ID: Connie Osborne, female   DOB: November 21, 1960, 55 y.o.   MRN: [id number]  Chief Complaint  Patient presents with  . New Patient (Initial Visit)    Right Upper Lobe Adenocarcinoma    Referred By Dr. Stoney Bang Reason for Referral right upper lobe mass  HPI Location, Quality, Duration, Severity, Timing, Context, Modifying Factors, Associated Signs and Symptoms.  Connie Osborne is a 55 y.o. female.  This patient is a 55 year old woman with an extensive past medical history of tobacco abuse smoking one pack cigarettes a day for 30 years. She also has a history of left-sided breast cancer status post lumpectomy and radiation therapy without chemotherapy. She's had no further follow-up with our oncologist secondary to her noncompliance. She began having an episode of hemoptysis several weeks ago. This prompted a visit to the emergency department where chest CT was performed. This revealed a right upper lobe mass with some parenchymal bleeding. The patient was then referred to Dr. Stoney Bang where bronchoscopy was performed and this revealed adenocarcinoma the lung. The patient has not had any further workup and is scheduled to see me today. She states she does get short of breath when she is working. 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 She continues to smoke a pack cigarettes a day. She states that she's had no fevers or chills. Her hemoptysis has improved. She has no significant pain or other issues at this time.   Past Medical History  Diagnosis Date  . Hypertension   . Allergy   . GERD (gastroesophageal reflux disease)   . Vitamin D deficiency   . Shortness of breath dyspnea   . Coughing up blood   . Breast cancer ([place])   . Arthritis   . COPD (chronic obstructive pulmonary disease) [facility])     Past Surgical History  Procedure Laterality Date  . Abdominal hysterectomy    . Foot surgery    . Ganglion cyst excision    .  Fracture surgery    . Breast surgery Left 2014    lumpectomy  . Electromagnetic navigation brochoscopy Right 07/28/2015    Procedure: ELECTROMAGNETIC NAVIGATION BRONCHOSCOPY;  Surgeon: Flora Lipps, MD;  Location: [facility] ORS;  Service: Cardiopulmonary;  Laterality: Right;    Family History  Problem Relation Age of Onset  . Cancer Sister 23    breast  . Cancer Sister 46    breast  . Cancer Other     breast  . Lung cancer Brother   . Hypertension Mother   . Diabetes Mellitus II Mother   . Cancer Mother     mets  . Cancer Father     long cancer    Social History Social History  Substance Use Topics  . Smoking status: Current Every Day Smoker -- 1.00 packs/day for 30 years    Types: Cigarettes  . Smokeless tobacco: None  . Alcohol Use: No    Allergies  Allergen Reactions  . Accupril [Quinapril Hcl] Cough  . Percocet [Oxycodone-Acetaminophen]     Current Outpatient Prescriptions  Medication Sig Dispense Refill  . acetaminophen-codeine (TYLENOL #3) 300-30 MG tablet Take 1 tablet by mouth 1 day or 1 dose.    . albuterol (PROVENTIL HFA;VENTOLIN HFA) 108 (90 Base) MCG/ACT inhaler Inhale 2 puffs into the lungs every 4 (four) hours as needed for wheezing or shortness of breath. 1 Inhaler 6  . amLODipine (NORVASC) 10 MG tablet Take 10 mg by mouth  daily.    . cetirizine (ZYRTEC) 10 MG tablet Take 10 mg by mouth as needed for allergies.    Marland Kitchen losartan (COZAAR) 100 MG tablet Take 100 mg by mouth daily.    . metoprolol succinate (TOPROL-XL) 25 MG 24 hr tablet Take 25 mg by mouth daily.    . predniSONE (DELTASONE) 20 MG tablet Take 2 tablets (40 mg total) by mouth daily with breakfast. 20 tablet 0   No current facility-administered medications for this visit.      Review of Systems A complete review of systems was asked and was negative except for the following positive findingsCough, hemoptysis, shortness of breath, joint pain, heartburn.  Blood pressure 148/89, pulse 64,  temperature 98 F (36.7 C), temperature source Oral, height '5\' 5"'$  (1.651 m), weight 151 lb 3.2 oz (68.584 kg), SpO2 98 %.  Physical Exam CONSTITUTIONAL:  Pleasant, well-developed, well-nourished, and in no acute distress. EYES: Pupils equal and reactive to light, Sclera non-icteric EARS, NOSE, MOUTH AND THROAT:  The oropharynx was clear.  Dentition is good repair.  Oral mucosa pink and moist. LYMPH NODES:  Lymph nodes in the neck and axillae were normal RESPIRATORY:  Lungs were clear.  Normal respiratory effort without pathologic use of accessory muscles of respiration CARDIOVASCULAR: Heart was regular without murmurs.  There were no carotid bruits. GI: The abdomen was soft, nontender, and nondistended. There were no palpable masses. There was no hepatosplenomegaly. There were normal bowel sounds in all quadrants. GU:  Rectal deferred.   MUSCULOSKELETAL:  Normal muscle strength and tone.  No clubbing or cyanosis.   SKIN:  There were no pathologic skin lesions.  There were no nodules on palpation. NEUROLOGIC:  Sensation is normal.  Cranial nerves are grossly intact. PSYCH:  Oriented to person, place and time.  Mood and affect are normal.  Data Reviewed CT scan  I have personally reviewed the patient's imaging, laboratory findings and medical records.    Assessment    The right upper lobe mass is positive for adenocarcinoma on biopsy. I see no obvious mediastinal disease. There is a right hilar lymph node which measures about 1.5 cm.    Plan    I would like her to see Dr. Ephraim Hamburger in oncology. I would also like her to meet with Mr. Melinda Crutch regarding smoking cessation. I would like to obtain some pulmonary function studies and a PET scan. I will see her back again in one week.       Nestor Lewandowsky, MD 08/04/2015, 10:48 AM

## 2015-08-04 NOTE — Progress Notes (Signed)
PROBLEMS: Adenocarcinoma of RUL Smoker Simple chronic bronchitis (HCC) Hemoptysis  INTERVAL HISTORY: Since last visit with me, she underwent ENB by Dr Mortimer Fries with bx positive for adenocarcinoma. Has been presented @ [facility]. Referral to Dr Genevive Bi for possible resection. PFTs and PET scan ordered. Oncology referral requested.   SUBJ: No new complaints. Denies dyspnea, CP, fever, purulent sputum, LE edema and calf tenderness. She continues to have scant hemoptysis (pink-tinged sputum). Still smokes 4-5 cigarettes per day  OBJ: Filed Vitals:   08/04/15 1050  BP: 128/72  Pulse: 66  Height: '5\' 5"'$  (1.651 m)  Weight: 151 lb (68.493 kg)  SpO2: 100%   NAD HEENT WNL BS full without wheezes RRR s M NABS, soft No focal neurological deficits   DATA: No new Xray or PFT data  IMPRESSION: Malignant neoplasm of upper lobe of right lung (HCC)  Smoker  Simple chronic bronchitis (HCC)  Hemoptysis    PLAN: 1) I spent most of the visit discussing the imperative of smoking cessation noting smoking as the likely cause of her cancer and emphasizing the risk reduction of major lung surgery f she quits smoking. We discussed strategies including NRT and varenicline.  2) trial of Anoro inhaler - one inhalation daily 3) Recommended NRT with 14 mg patch daily 4) ROV 2 months which will hopefully be a post-op follow up    Merton Border, MD PCCM service Mobile [phone] Pager [phone] 08/04/2015

## 2015-08-04 NOTE — Progress Notes (Signed)
Patient ID: Connie Osborne, female   DOB: February 02, 1961, 55 y.o.   MRN: [id number]  Patient seen in the office today and instructed on use of ANORO ELLIPTA.  Patient expressed understanding and demonstrated technique.

## 2015-08-04 NOTE — Patient Instructions (Signed)
We will call you with your appointment for your PFT (Pulmonary Function Test) and PET Scan.  We will also call you with your appointment with Dr. Rogue Bussing (oncology doctor).  Shawn our nurse navigator will be calling you to help you with your smoking cessation.

## 2015-08-05 ENCOUNTER — Telehealth: Payer: Self-pay

## 2015-08-05 NOTE — Telephone Encounter (Signed)
Patient called before I called her to give her the following appointments: PFT: 08-06-2015 at the [facility] at 9:00 AM but be there at 8:30 AM..Marland KitchenPET Scan will be on 08-10-2015 at 7:30 AM but arrive at 7:00 AM (NPO after midnight) at the [facility]. Patient understood and had no further questions.   I also told patient that her referral to the [facility] with an Oncologist was placed and that they will contact her. Patient understood.

## 2015-08-06 ENCOUNTER — Ambulatory Visit: Payer: BLUE CROSS/BLUE SHIELD | Attending: Cardiothoracic Surgery

## 2015-08-06 DIAGNOSIS — R918 Other nonspecific abnormal finding of lung field: Secondary | ICD-10-CM | POA: Diagnosis not present

## 2015-08-06 DIAGNOSIS — J984 Other disorders of lung: Secondary | ICD-10-CM

## 2015-08-06 LAB — BLOOD GAS, ARTERIAL
ACID-BASE EXCESS: 2.3 mmol/L (ref 0.0–3.0)
Allens test (pass/fail): POSITIVE — AB
BICARBONATE: 27.3 meq/L (ref 21.0–28.0)
FIO2: 21
O2 Saturation: 96.5 %
PCO2 ART: 43 mmHg (ref 32.0–48.0)
PH ART: 7.41 (ref 7.350–7.450)
Patient temperature: 37
pO2, Arterial: 85 mmHg (ref 83.0–108.0)

## 2015-08-07 ENCOUNTER — Inpatient Hospital Stay: Payer: BLUE CROSS/BLUE SHIELD | Attending: Internal Medicine | Admitting: Internal Medicine

## 2015-08-07 VITALS — BP 142/90 | HR 69 | Temp 97.7°F | Resp 18 | Ht 65.0 in | Wt 152.8 lb

## 2015-08-07 DIAGNOSIS — Z9223 Personal history of estrogen therapy: Secondary | ICD-10-CM | POA: Diagnosis not present

## 2015-08-07 DIAGNOSIS — M1990 Unspecified osteoarthritis, unspecified site: Secondary | ICD-10-CM | POA: Insufficient documentation

## 2015-08-07 DIAGNOSIS — Z86000 Personal history of in-situ neoplasm of breast: Secondary | ICD-10-CM | POA: Insufficient documentation

## 2015-08-07 DIAGNOSIS — Z148 Genetic carrier of other disease: Secondary | ICD-10-CM | POA: Diagnosis not present

## 2015-08-07 DIAGNOSIS — Z9119 Patient's noncompliance with other medical treatment and regimen: Secondary | ICD-10-CM | POA: Insufficient documentation

## 2015-08-07 DIAGNOSIS — R0602 Shortness of breath: Secondary | ICD-10-CM | POA: Diagnosis not present

## 2015-08-07 DIAGNOSIS — J449 Chronic obstructive pulmonary disease, unspecified: Secondary | ICD-10-CM | POA: Diagnosis not present

## 2015-08-07 DIAGNOSIS — C3411 Malignant neoplasm of upper lobe, right bronchus or lung: Secondary | ICD-10-CM | POA: Insufficient documentation

## 2015-08-07 DIAGNOSIS — F17210 Nicotine dependence, cigarettes, uncomplicated: Secondary | ICD-10-CM | POA: Diagnosis not present

## 2015-08-07 DIAGNOSIS — Z79899 Other long term (current) drug therapy: Secondary | ICD-10-CM | POA: Insufficient documentation

## 2015-08-07 DIAGNOSIS — K219 Gastro-esophageal reflux disease without esophagitis: Secondary | ICD-10-CM | POA: Insufficient documentation

## 2015-08-07 DIAGNOSIS — I10 Essential (primary) hypertension: Secondary | ICD-10-CM | POA: Diagnosis not present

## 2015-08-07 NOTE — Progress Notes (Signed)
Patient here today regarding right upper lobe adenocarcinoma.  Referred by Dr. Genevive Bi.  Patient has history of left breast cancer 2 years ago.  Patient underwent lumpectomy and radiation.  Patient has a cough tinged with blood.  States it interferes with her sleep.

## 2015-08-07 NOTE — Progress Notes (Signed)
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  female patient with long-standing history of smoking and a previous history of DCIS status post lumpectomy- noted to have hemoptysis few  Weeks ago. This led to further imaging- noted to have- that showed a 13 x 10 x 12 mm lung nodule right upper lobe; and a 13 mm right enlarged hilar lymph node. She has been evaluated by thoracic surgery. She is awaiting a PET scan in a few days. She has been referred to [place] for further evaluation especially in the context of her previous DCIS.   Patient has mild shortness of breath; the hemoptysis has resolved. No significant cough. Denies any lumps or bumps. She stopped taking tamoxifen because of body aches joint pains.  Otherwise no nausea no vomiting. No abdominal pain. No headache. No constipation diarrhea.  ROS: A complete 10 point review of system is done which is negative except mentioned above in history of present illness  MEDICAL HISTORY:  Past Medical History  Diagnosis Date  . Hypertension   . Allergy   . GERD (gastroesophageal reflux disease)   . Vitamin D deficiency   . Shortness of breath dyspnea   . Coughing up blood   . Breast cancer ([place])   . Arthritis   . COPD (chronic obstructive pulmonary disease) ([place])     SURGICAL HISTORY: Past Surgical History  Procedure Laterality Date  . Abdominal hysterectomy    . Foot surgery    . Ganglion cyst excision    . Fracture surgery    . Breast surgery Left 2014    lumpectomy   . Electromagnetic navigation brochoscopy Right 07/28/2015    Procedure: ELECTROMAGNETIC NAVIGATION BRONCHOSCOPY;  Surgeon: Flora Lipps, MD;  Location: [facility] ORS;  Service: Cardiopulmonary;  Laterality: Right;    SOCIAL HISTORY: Social History   Social History  . Marital Status: Divorced    Spouse Name: N/A  . Number of Children: N/A  . Years of Education: N/A   Occupational History  . Not on file.   Social History Main Topics  . Smoking status: Current Every Day Smoker -- 1.00 packs/day for 30 years    Types: Cigarettes  . Smokeless tobacco: Not on file  . Alcohol Use: No  . Drug Use: No  . Sexual Activity: Not on file   Other Topics Concern  . Not on file   Social History Narrative    FAMILY HISTORY: Family History  Problem Relation Age of Onset  . Cancer Sister 50    breast  . Cancer Sister 72    breast  . Cancer Other     breast  . Lung cancer Brother   . Hypertension Mother   . Diabetes Mellitus II Mother   . Cancer Mother     mets  . Cancer Father     long cancer    ALLERGIES:  is allergic to accupril and percocet.  MEDICATIONS:  Current Outpatient Prescriptions  Medication Sig Dispense Refill  . albuterol (PROVENTIL HFA;VENTOLIN HFA) 108 (90 Base) MCG/ACT inhaler  Inhale 2 puffs into the lungs every 4 (four) hours as needed for wheezing or shortness of breath. 1 Inhaler 6  . amLODipine (NORVASC) 10 MG tablet Take 10 mg by mouth daily.    . cetirizine (ZYRTEC) 10 MG tablet Take 10 mg by mouth as needed for allergies.    Marland Kitchen losartan (COZAAR) 100 MG tablet Take 100 mg by mouth daily.    . metoprolol succinate (TOPROL-XL) 25 MG 24 hr tablet Take 25 mg by mouth daily.    . nicotine (NICODERM CQ) 14 mg/24hr patch Place 1 patch (14 mg total) onto the skin daily. 28 patch 10  . umeclidinium-vilanterol (ANORO ELLIPTA) 62.5-25 MCG/INH AEPB Inhale 1 puff into the lungs daily. 60 each 5  . acetaminophen-codeine (TYLENOL #3) 300-30 MG tablet Take 1 tablet by mouth 1  day or 1 dose. Reported on 08/07/2015     No current facility-administered medications for this visit.      Marland Kitchen  PHYSICAL EXAMINATION: ECOG PERFORMANCE STATUS: 0 - Asymptomatic  Filed Vitals:   08/07/15 1346  BP: 142/90  Pulse: 69  Temp: 97.7 F (36.5 C)  Resp: 18   Filed Weights   08/07/15 1346  Weight: 152 lb 12.5 oz (69.3 kg)    GENERAL: Well-nourished well-developed; Alert, no distress and comfortable.   Alone.  EYES: no pallor or icterus OROPHARYNX: no thrush or ulceration; good dentition  NECK: supple, no masses felt LYMPH:  no palpable lymphadenopathy in the cervical, axillary or inguinal regions LUNGS: clear to auscultation and  No wheeze or crackles HEART/CVS: regular rate & rhythm and no murmurs; No lower extremity edema ABDOMEN: abdomen soft, non-tender and normal bowel sounds Musculoskeletal:no cyanosis of digits and no clubbing  PSYCH: alert & oriented x 3 with fluent speech NEURO: no focal motor/sensory deficits SKIN:  no rashes or significant lesions Right and left BREAST exam [in the presence of nurse]- no unusual skin changes or dominant masses felt. Surgical scars noted in left breast.    LABORATORY DATA:  I have reviewed the data as listed Lab Results  Component Value Date   WBC 8.4 06/30/2015   HGB 12.7 06/30/2015   HCT 36.2 06/30/2015   MCV 93.8 06/30/2015   PLT 296 06/30/2015    Recent Labs  06/30/15 0807  NA 142  K 3.4*  CL 108  CO2 26  GLUCOSE 128*  BUN 11  CREATININE 0.61  CALCIUM 9.6  GFRNONAA >60  GFRAA >60    RADIOGRAPHIC STUDIES: I have personally reviewed the radiological images as listed and agreed with the findings in the report. Dg C-arm 1-60 Min-no Report  07/28/2015  CLINICAL DATA: procedure C-ARM 1-60 MINUTES Fluoroscopy was utilized by the requesting physician.  No radiographic interpretation.    ASSESSMENT & PLAN:   # Right upper lobe adenocarcinoma-most likely lung primary; T1 N1 [based on the CT scan]-  awaiting PET scan. Discussed that if the PET scan does not show any distant metastatic disease; and the PFTs are adequate surgery will most likely be recommended. Based upon final pathology patient might benefit from adjuvant chemotherapy.   # DCIS s/p lumpec & RT- non-compliant with Tamoxifen.   # BRCA-1- Positive. Will discuss with subsequent visits- regarding prophylactic mastectomy/oophorectomy especially given the significant noncompliance.   Thank you Dr.Oaks for allowing me to participate in the care of your pleasant patient. Please do not hesitate to contact me with questions or concerns in the interim. I have paged Dr. Faith Rogue to discuss the above plan.   #  Patient follow-up with me in approximately 2-3 weeks post surgery.     Cammie Sickle, MD 08/07/2015 4:35 PM

## 2015-08-10 ENCOUNTER — Telehealth: Payer: Self-pay | Admitting: Pulmonary Disease

## 2015-08-10 ENCOUNTER — Ambulatory Visit
Admission: RE | Admit: 2015-08-10 | Discharge: 2015-08-10 | Disposition: A | Discharge status: Home / Self Care | Payer: BLUE CROSS/BLUE SHIELD | Source: Ambulatory Visit | Attending: Cardiothoracic Surgery | Admitting: Cardiothoracic Surgery

## 2015-08-10 DIAGNOSIS — J439 Emphysema, unspecified: Secondary | ICD-10-CM | POA: Diagnosis not present

## 2015-08-10 DIAGNOSIS — N200 Calculus of kidney: Secondary | ICD-10-CM | POA: Insufficient documentation

## 2015-08-10 DIAGNOSIS — N63 Unspecified lump in breast: Secondary | ICD-10-CM | POA: Insufficient documentation

## 2015-08-10 DIAGNOSIS — C3411 Malignant neoplasm of upper lobe, right bronchus or lung: Secondary | ICD-10-CM | POA: Diagnosis present

## 2015-08-10 DIAGNOSIS — J984 Other disorders of lung: Secondary | ICD-10-CM

## 2015-08-10 LAB — GLUCOSE, CAPILLARY: Glucose-Capillary: 93 mg/dL (ref 65–99)

## 2015-08-10 MED ORDER — FLUDEOXYGLUCOSE F - 18 (FDG) INJECTION
12.0430 | Freq: Once | INTRAVENOUS | Status: AC | PRN
  Administered 2015-08-10: 12.043 via INTRAVENOUS

## 2015-08-10 NOTE — Telephone Encounter (Signed)
Patient wants to know if the insurance papers are ready to pick up. Please call patient.

## 2015-08-10 NOTE — Telephone Encounter (Signed)
I recall her giving me the paper work. I think I did not fill it out and I am not sure where it is now. See if we can find it around there or get another copy. Apologize to her for me  Merton Border, MD PCCM service Mobile [phone] Pager [phone] 08/10/2015

## 2015-08-10 NOTE — Telephone Encounter (Signed)
Informed pt of response from DS. Pt will bring a copy for me so that I can make sure it is taken care of.

## 2015-08-10 NOTE — Telephone Encounter (Signed)
Pt is asking about some insurance papers she brought in with her to her appt. States they were given to you to fill out. Please advise if you have any papers for this pt and if so are they ready for her to pick up. Thanks.

## 2015-08-11 ENCOUNTER — Telehealth: Payer: Self-pay | Admitting: Internal Medicine

## 2015-08-11 ENCOUNTER — Ambulatory Visit (INDEPENDENT_AMBULATORY_CARE_PROVIDER_SITE_OTHER): Payer: BLUE CROSS/BLUE SHIELD | Admitting: Cardiothoracic Surgery

## 2015-08-11 ENCOUNTER — Encounter: Payer: Self-pay | Admitting: Cardiothoracic Surgery

## 2015-08-11 VITALS — BP 165/92 | HR 60 | Temp 97.8°F | Ht 63.0 in | Wt 155.0 lb

## 2015-08-11 DIAGNOSIS — N63 Unspecified lump in unspecified breast: Secondary | ICD-10-CM

## 2015-08-11 DIAGNOSIS — R918 Other nonspecific abnormal finding of lung field: Secondary | ICD-10-CM | POA: Diagnosis not present

## 2015-08-11 DIAGNOSIS — D0510 Intraductal carcinoma in situ of unspecified breast: Secondary | ICD-10-CM

## 2015-08-11 NOTE — Telephone Encounter (Signed)
Spoke to Dr.Oaks re: PEt scan; recommend diagnostic mammogram/US of left breast [based on PET scan];I  will also speak to Dr.Sankar re: Breast uptake/colon uptake.   Hayley- Please order diagnostic mammogram/US of left breast ASAP. Thx

## 2015-08-11 NOTE — Addendum Note (Signed)
Addended by: Telford Nab on: 08/11/2015 02:57 PM   Modules accepted: Orders

## 2015-08-11 NOTE — Progress Notes (Signed)
Connie Osborne Inpatient Post-Op Note  Patient ID: Connie Osborne, female   DOB: Nov 22, 1960, 55 y.o.   MRN: [id number]  HISTORY: This patient returns today in follow-up. The good news is that she stop smoking last week. She continues to have a cough with occasional streaks of blood. She did see Dr. Oneita Jolly he did not believe that the adenocarcinoma from the right upper lobe is metastatic from the breast. She also underwent a PET scan yesterday. The PET scan revealed uptake in the right upper lobe nodule as well as in the mediastinum as well as in the left breast and also in the sigmoid colon. The patient states that she is not short of breath. She has not had any fevers or chills.   Filed Vitals:   08/11/15 0836  BP: 165/92  Pulse: 60  Temp: 97.8 F (36.6 C)     EXAM: Resp: Lungs are clear bilaterally.  No respiratory distress, normal effort. Heart:  Regular without murmurs Abd:  Abdomen is soft, non distended and non tender. No masses are palpable.  There is no rebound and no guarding.  Neurological: Alert and oriented to person, place, and time. Coordination normal.  Skin: Skin is warm and dry. No rash noted. No diaphoretic. No erythema. No pallor.  Psychiatric: Normal mood and affect. Normal behavior. Judgment and thought content normal.   Examination of the left breast reveals a well-healed surgical scar. There are some fullness underneath this but no definitive palpable mass. There is no mass within the left axilla.  ASSESSMENT: I have independently reviewed the patient's PET scan. There are multitude of findings on it. There is an enlarged lower right paratracheal node. There is also a ill-defined left breast mass and a sigmoid colon mass. I did review revealed these to the patient and we discussed the implications.   PLAN:   I gave the patient my business card and told her to contact me tomorrow. In the meantime I'll get a hold of Dr. Oneita Jolly he and Dr. Stoney Bang. We will make further  recommendations after their input. It would appear that she may have stage IIIa carcinoma the lung and colon cancer. She has not undergone a colonoscopy to the best of our knowledge.    Nestor Lewandowsky, MD

## 2015-08-11 NOTE — Patient Instructions (Signed)
Please remember to call office tomorrow and speak with Dr. Genevive Bi. Please call our office if you have any questions or concerns.

## 2015-08-11 NOTE — Telephone Encounter (Signed)
Spoke to Dr. [name]; agreed to see patient this week/likely see this Thursday. Also, Informed Dr.Oaks.

## 2015-08-12 ENCOUNTER — Telehealth: Payer: Self-pay | Admitting: Cardiothoracic Surgery

## 2015-08-12 ENCOUNTER — Encounter: Payer: Self-pay | Admitting: General Surgery

## 2015-08-12 ENCOUNTER — Encounter: Payer: Self-pay | Admitting: *Deleted

## 2015-08-12 NOTE — Telephone Encounter (Signed)
Message sent to Dr. Genevive Bi to speak with patient at this time.

## 2015-08-12 NOTE — Telephone Encounter (Signed)
Patient saw Dr Genevive Bi yesterday (6/14) He gave her his business card and told her to call in today to speak with him. He is not in the office. He was supposed to be speaking with Dr Gretel Acre and Dr Stoney Bang about her next steps and speaking with her today about that. Please call and advise, thanks.

## 2015-08-13 ENCOUNTER — Encounter: Payer: Self-pay | Admitting: *Deleted

## 2015-08-13 ENCOUNTER — Encounter: Payer: Self-pay | Admitting: General Surgery

## 2015-08-13 ENCOUNTER — Ambulatory Visit (INDEPENDENT_AMBULATORY_CARE_PROVIDER_SITE_OTHER): Payer: BLUE CROSS/BLUE SHIELD | Admitting: General Surgery

## 2015-08-13 VITALS — BP 144/86 | HR 64 | Resp 12 | Ht 65.0 in | Wt 154.0 lb

## 2015-08-13 DIAGNOSIS — Z1501 Genetic susceptibility to malignant neoplasm of breast: Secondary | ICD-10-CM

## 2015-08-13 DIAGNOSIS — C3411 Malignant neoplasm of upper lobe, right bronchus or lung: Secondary | ICD-10-CM | POA: Diagnosis not present

## 2015-08-13 DIAGNOSIS — R933 Abnormal findings on diagnostic imaging of other parts of digestive tract: Secondary | ICD-10-CM

## 2015-08-13 DIAGNOSIS — Z1509 Genetic susceptibility to other malignant neoplasm: Secondary | ICD-10-CM

## 2015-08-13 DIAGNOSIS — D0512 Intraductal carcinoma in situ of left breast: Secondary | ICD-10-CM | POA: Diagnosis not present

## 2015-08-13 MED ORDER — POLYETHYLENE GLYCOL 3350 17 GM/SCOOP PO POWD: ORAL | Status: DC

## 2015-08-13 NOTE — Progress Notes (Signed)
Patient ID: Connie Osborne, female   DOB: [birth year], 55 y.o.   MRN: [id number]  Chief Complaint  Patient presents with  . Follow-up    PET scan    HPI Connie Osborne is a 55 y.o. female.  Here today for follow up PET scan she states a few spots showed up. She denies any gastrointestinal issues. She can not feel anything different in the breast. Patient denies any GI symptoms.  She states she may have had a colonoscopy years ago.  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 She initially started on Tamoxifen but discontinued this few mos after. She did test pos for BRCA1. Also did not follow up here or with oncology  As scheduled. Recently she had blood tinged sputum and w/u revealed a RUL spiculated mass. Navigational biopsy showed adenoca, likely lepidic, TTF1 pos. Subsequent PET revealed hypermetabolic spots in left breast and proximal sigmoid colon.  I have reviewed the history of present illness with the patient.  Past Medical History  Diagnosis Date  . Hypertension   . Allergy   . GERD (gastroesophageal reflux disease)   . Vitamin D deficiency   . Shortness of breath dyspnea   . Coughing up blood   . Breast cancer ([place])   . Arthritis   . COPD (chronic obstructive pulmonary disease) [facility])     Past Surgical History  Procedure Laterality Date  . Abdominal hysterectomy    . Foot surgery    . Ganglion cyst excision    . Fracture surgery    . Breast surgery Left 2014    lumpectomy  . Electromagnetic navigation brochoscopy Right 07/28/2015    Procedure: ELECTROMAGNETIC NAVIGATION BRONCHOSCOPY;  Surgeon: Flora Lipps, MD;  Location: [facility] ORS;  Service: Cardiopulmonary;  Laterality: Right;    Family History  Problem Relation Age of Onset  . Cancer Sister 55    breast  . Cancer Sister 64    breast  . Cancer Other     breast  . Lung cancer Brother   . Hypertension Mother   . Diabetes Mellitus II Mother   . Cancer Mother     mets  . Cancer Father      long cancer    Social History Social History  Substance Use Topics  . Smoking status: Former Smoker -- 1.00 packs/day for 30 years    Types: Cigarettes  . Smokeless tobacco: Never Used  . Alcohol Use: No    Allergies  Allergen Reactions  . Accupril [Quinapril Hcl] Cough  . Percocet [Oxycodone-Acetaminophen]     Current Outpatient Prescriptions  Medication Sig Dispense Refill  . albuterol (PROVENTIL HFA;VENTOLIN HFA) 108 (90 Base) MCG/ACT inhaler Inhale 2 puffs into the lungs every 4 (four) hours as needed for wheezing or shortness of breath. 1 Inhaler 6  . amLODipine (NORVASC) 10 MG tablet Take 10 mg by mouth daily.    . cetirizine (ZYRTEC) 10 MG tablet Take 10 mg by mouth as needed for allergies.    Marland Kitchen losartan (COZAAR) 100 MG tablet Take 100 mg by mouth daily.    . metoprolol succinate (TOPROL-XL) 25 MG 24 hr tablet Take 25 mg by mouth daily.    Marland Kitchen umeclidinium-vilanterol (ANORO ELLIPTA) 62.5-25 MCG/INH AEPB Inhale 1 puff into the lungs daily. 60 each 5  . polyethylene glycol powder (GLYCOLAX/MIRALAX) powder 255 grams one bottle for colonoscopy prep 255 g 0   No current facility-administered medications for this visit.    Review of Systems  Review of Systems  Constitutional: Negative.   Respiratory: Positive for cough.   Cardiovascular: Negative.     Blood pressure 144/86, pulse 64, resp. rate 12, height _0  (1.651 m), weight 154 lb (69.854 kg).  Physical Exam Physical Exam  Constitutional: She is oriented to person, place, and time. She appears well-developed and well-nourished.  HENT:  Mouth/Throat: Oropharynx is clear and moist.  Eyes: Conjunctivae are normal. No scleral icterus.  Neck: Neck supple.  Cardiovascular: Normal rate, regular rhythm and normal heart sounds.   Pulmonary/Chest: Effort normal and breath sounds normal. Right breast exhibits no inverted nipple, no mass, no nipple discharge, no skin change and no tenderness. Left breast exhibits mass. Left  breast exhibits no inverted nipple, no nipple discharge, no skin change and no tenderness.    Left breast: Lumpectomy site  2cm hard mass , 30cl location  Abdominal: Soft. Bowel sounds are normal. She exhibits no distension. There is no tenderness.  Lymphadenopathy:    She has no cervical adenopathy.    She has no axillary adenopathy.  Neurological: She is alert and oriented to person, place, and time.  Skin: Skin is warm and dry.  Psychiatric: Her behavior is normal.    Data Reviewed Prior notes and PET scan.  Assessment   BRCA 1 carroer Left breast mass at 3 o'clock position-this is likely fat necrosis. Mammogram scheduled. Sigmoid colon finding needs colonoscopy. Lung CA- this appears to be lung primary. Treatment to be decided after colonoscopy and mammogram. All of this discussed fully with pt.     Plan   CBC, Met C, CEA, CA27-29 collected today.  Recommend colonoscopy based on PET scan results for evaluation. Mammogram scheduled for 6/22 and recommend scheduling of core biopsy of left breast mass. Follow-up in the office.   Colonoscopy with possible biopsy/polypectomy prn: Information regarding the procedure, including its potential risks and complications (including but not limited to perforation of the bowel, which may require emergency surgery to repair, and bleeding) was verbally given to the patient. Educational information regarding lower intestinal endoscopy was given to the patient. Written instructions for how to complete the bowel prep using Miralax were provided. The importance of drinking ample fluids to avoid dehydration as a result of the prep emphasized.  Patient has been scheduled for a colonoscopy on 08-18-15 at [facility].  >38mnutes spent in this encounter-most on discussion, record review and coordination of planned work up.    PCP:  KPlacido SouRef Dr BRogue Bussing This information has been scribed by MKarie FetchRN, BSN,BC.    Risha Barretta  G 08/13/2015, 10:25 AM

## 2015-08-13 NOTE — Patient Instructions (Addendum)
Patient has been scheduled for a colonoscopy on 08-18-15 at [facility].   The patient is aware to call back for any questions or concerns.

## 2015-08-14 ENCOUNTER — Inpatient Hospital Stay: Payer: BLUE CROSS/BLUE SHIELD | Admitting: Internal Medicine

## 2015-08-14 LAB — COMPREHENSIVE METABOLIC PANEL
A/G RATIO: 1.8 (ref 1.2–2.2)
ALT: 13 IU/L (ref 0–32)
AST: 13 IU/L (ref 0–40)
Albumin: 4.4 g/dL (ref 3.5–5.5)
Alkaline Phosphatase: 121 IU/L — ABNORMAL HIGH (ref 39–117)
BUN/Creatinine Ratio: 17 (ref 9–23)
BUN: 12 mg/dL (ref 6–24)
CALCIUM: 9.9 mg/dL (ref 8.7–10.2)
CHLORIDE: 103 mmol/L (ref 96–106)
CO2: 26 mmol/L (ref 18–29)
Creatinine, Ser: 0.69 mg/dL (ref 0.57–1.00)
GFR calc Af Amer: 113 mL/min/{1.73_m2} (ref >59)
GFR calc non Af Amer: 98 mL/min/{1.73_m2} (ref >59)
Globulin, Total: 2.5 g/dL (ref 1.5–4.5)
Glucose: 104 mg/dL — ABNORMAL HIGH (ref 65–99)
POTASSIUM: 4.7 mmol/L (ref 3.5–5.2)
Sodium: 143 mmol/L (ref 134–144)
Total Protein: 6.9 g/dL (ref 6.0–8.5)

## 2015-08-14 LAB — CBC WITH DIFFERENTIAL/PLATELET
Basophils Absolute: 0.1 10*3/uL (ref 0.0–0.2)
Basos: 1 %
EOS (ABSOLUTE): 0.2 10*3/uL (ref 0.0–0.4)
EOS: 3 %
HEMATOCRIT: 35.2 % (ref 34.0–46.6)
HEMOGLOBIN: 12.3 g/dL (ref 11.1–15.9)
Immature Grans (Abs): 0 10*3/uL (ref 0.0–0.1)
Immature Granulocytes: 0 %
LYMPHS ABS: 2.4 10*3/uL (ref 0.7–3.1)
Lymphs: 37 %
MCH: 33.2 pg — ABNORMAL HIGH (ref 26.6–33.0)
MCHC: 34.9 g/dL (ref 31.5–35.7)
MCV: 95 fL (ref 79–97)
MONOCYTES: 8 %
Monocytes Absolute: 0.5 10*3/uL (ref 0.1–0.9)
NEUTROS ABS: 3.5 10*3/uL (ref 1.4–7.0)
Neutrophils: 51 %
Platelets: 366 10*3/uL (ref 150–379)
RBC: 3.7 x10E6/uL — AB (ref 3.77–5.28)
RDW: 13.3 % (ref 12.3–15.4)
WBC: 6.7 10*3/uL (ref 3.4–10.8)

## 2015-08-14 LAB — CEA: CEA: 5.6 ng/mL — ABNORMAL HIGH (ref 0.0–4.7)

## 2015-08-14 LAB — CANCER ANTIGEN 27.29: CA 27.29: 22.6 U/mL (ref 0.0–38.6)

## 2015-08-17 NOTE — Addendum Note (Signed)
Addended by: Christene Lye on: 08/17/2015 12:48 PM   Modules accepted: Orders

## 2015-08-18 ENCOUNTER — Ambulatory Visit
Admission: RE | Admit: 2015-08-18 | Discharge: 2015-08-18 | Disposition: A | Discharge status: Home / Self Care | Payer: BLUE CROSS/BLUE SHIELD | Source: Ambulatory Visit | Attending: General Surgery | Admitting: General Surgery

## 2015-08-18 ENCOUNTER — Encounter: Payer: Self-pay | Admitting: *Deleted

## 2015-08-18 ENCOUNTER — Ambulatory Visit: Payer: BLUE CROSS/BLUE SHIELD | Admitting: Anesthesiology

## 2015-08-18 ENCOUNTER — Encounter
Admission: RE | Disposition: A | Discharge status: Home / Self Care | Payer: Self-pay | Source: Ambulatory Visit | Attending: General Surgery

## 2015-08-18 DIAGNOSIS — Z808 Family history of malignant neoplasm of other organs or systems: Secondary | ICD-10-CM | POA: Insufficient documentation

## 2015-08-18 DIAGNOSIS — Z853 Personal history of malignant neoplasm of breast: Secondary | ICD-10-CM | POA: Insufficient documentation

## 2015-08-18 DIAGNOSIS — Z87891 Personal history of nicotine dependence: Secondary | ICD-10-CM | POA: Insufficient documentation

## 2015-08-18 DIAGNOSIS — Z9109 Other allergy status, other than to drugs and biological substances: Secondary | ICD-10-CM | POA: Insufficient documentation

## 2015-08-18 DIAGNOSIS — K219 Gastro-esophageal reflux disease without esophagitis: Secondary | ICD-10-CM | POA: Insufficient documentation

## 2015-08-18 DIAGNOSIS — Z833 Family history of diabetes mellitus: Secondary | ICD-10-CM | POA: Insufficient documentation

## 2015-08-18 DIAGNOSIS — D127 Benign neoplasm of rectosigmoid junction: Secondary | ICD-10-CM | POA: Diagnosis not present

## 2015-08-18 DIAGNOSIS — Z9889 Other specified postprocedural states: Secondary | ICD-10-CM | POA: Diagnosis not present

## 2015-08-18 DIAGNOSIS — Z79899 Other long term (current) drug therapy: Secondary | ICD-10-CM | POA: Insufficient documentation

## 2015-08-18 DIAGNOSIS — I10 Essential (primary) hypertension: Secondary | ICD-10-CM | POA: Diagnosis not present

## 2015-08-18 DIAGNOSIS — Z885 Allergy status to narcotic agent status: Secondary | ICD-10-CM | POA: Diagnosis not present

## 2015-08-18 DIAGNOSIS — E559 Vitamin D deficiency, unspecified: Secondary | ICD-10-CM | POA: Insufficient documentation

## 2015-08-18 DIAGNOSIS — R933 Abnormal findings on diagnostic imaging of other parts of digestive tract: Secondary | ICD-10-CM | POA: Diagnosis present

## 2015-08-18 DIAGNOSIS — N63 Unspecified lump in breast: Secondary | ICD-10-CM | POA: Diagnosis not present

## 2015-08-18 DIAGNOSIS — K621 Rectal polyp: Secondary | ICD-10-CM | POA: Insufficient documentation

## 2015-08-18 DIAGNOSIS — Z90710 Acquired absence of both cervix and uterus: Secondary | ICD-10-CM | POA: Insufficient documentation

## 2015-08-18 DIAGNOSIS — Z801 Family history of malignant neoplasm of trachea, bronchus and lung: Secondary | ICD-10-CM | POA: Diagnosis not present

## 2015-08-18 DIAGNOSIS — Z8249 Family history of ischemic heart disease and other diseases of the circulatory system: Secondary | ICD-10-CM | POA: Insufficient documentation

## 2015-08-18 DIAGNOSIS — D125 Benign neoplasm of sigmoid colon: Secondary | ICD-10-CM | POA: Insufficient documentation

## 2015-08-18 DIAGNOSIS — Z888 Allergy status to other drugs, medicaments and biological substances status: Secondary | ICD-10-CM | POA: Insufficient documentation

## 2015-08-18 DIAGNOSIS — M1990 Unspecified osteoarthritis, unspecified site: Secondary | ICD-10-CM | POA: Insufficient documentation

## 2015-08-18 DIAGNOSIS — C3490 Malignant neoplasm of unspecified part of unspecified bronchus or lung: Secondary | ICD-10-CM | POA: Insufficient documentation

## 2015-08-18 DIAGNOSIS — Z803 Family history of malignant neoplasm of breast: Secondary | ICD-10-CM | POA: Diagnosis not present

## 2015-08-18 DIAGNOSIS — J449 Chronic obstructive pulmonary disease, unspecified: Secondary | ICD-10-CM | POA: Insufficient documentation

## 2015-08-18 HISTORY — PX: COLONOSCOPY WITH PROPOFOL: SHX5780

## 2015-08-18 SURGERY — COLONOSCOPY WITH PROPOFOL
Anesthesia: General

## 2015-08-18 MED ORDER — ONDANSETRON HCL 4 MG/2ML IJ SOLN
INTRAMUSCULAR | Status: DC | PRN
  Administered 2015-08-18: 4 mg via INTRAVENOUS

## 2015-08-18 MED ORDER — MIDAZOLAM HCL 2 MG/2ML IJ SOLN
INTRAMUSCULAR | Status: DC | PRN
  Administered 2015-08-18: 1 mg via INTRAVENOUS

## 2015-08-18 MED ORDER — EPHEDRINE SULFATE 50 MG/ML IJ SOLN
INTRAMUSCULAR | Status: DC | PRN
  Administered 2015-08-18: 10 mg via INTRAVENOUS

## 2015-08-18 MED ORDER — SODIUM CHLORIDE 0.9 % IV SOLN
INTRAVENOUS | Status: DC
  Administered 2015-08-18: 16:00:00 via INTRAVENOUS
  Administered 2015-08-18: 1000 mL via INTRAVENOUS

## 2015-08-18 MED ORDER — SPOT INK MARKER SYRINGE KIT
PACK | SUBMUCOSAL | Status: DC | PRN
  Administered 2015-08-18: 5 mL via SUBMUCOSAL

## 2015-08-18 MED ORDER — LIDOCAINE HCL (CARDIAC) 20 MG/ML IV SOLN
INTRAVENOUS | Status: DC | PRN
  Administered 2015-08-18: 60 mg via INTRAVENOUS

## 2015-08-18 MED ORDER — PROPOFOL 500 MG/50ML IV EMUL
INTRAVENOUS | Status: DC | PRN
  Administered 2015-08-18: 120 ug/kg/min via INTRAVENOUS

## 2015-08-18 MED ORDER — PROPOFOL 10 MG/ML IV BOLUS
INTRAVENOUS | Status: DC | PRN
  Administered 2015-08-18 (×2): 20 mg via INTRAVENOUS
  Administered 2015-08-18: 40 mg via INTRAVENOUS

## 2015-08-18 NOTE — Transfer of Care (Signed)
Immediate Anesthesia Transfer of Care Note  Patient: Connie Osborne  Procedure(s) Performed: Procedure(s): COLONOSCOPY WITH PROPOFOL (N/A)  Patient Location: PACU  Anesthesia Type:General  Level of Consciousness: awake and patient cooperative  Airway & Oxygen Therapy: Patient Spontanous Breathing and Patient connected to nasal cannula oxygen  Post-op Assessment: Report given to RN  Post vital signs: Reviewed and stable  Last Vitals:  Filed Vitals:   08/18/15 1359 08/18/15 1723  BP: 110/57 143/90  Pulse: 59 85  Temp: 36.8 C 36.5 C  Resp: 16 16    Last Pain: There were no vitals filed for this visit.       Complications: No apparent anesthesia complications

## 2015-08-18 NOTE — Op Note (Addendum)
[facility] Gastroenterology Patient Name: Connie Osborne Procedure Date: 08/18/2015 3:42 PM MRN: [id number] Account #: [id number] Date of Birth: October 18, 1960 Admit Type: Outpatient Age: 55 Room: [facility] ENDO ROOM 4 Gender: Female Note Status: Finalized Procedure:            Colonoscopy Indications:          Abnormal PET scan of the GI tract Providers:            Donell Tomkins G. [name], MD Referring MD:         Placido Sou (Referring MD) Medicines:            General Anesthesia Complications:        No immediate complications. Procedure:            Pre-Anesthesia Assessment:                       - General anesthesia under the supervision of an                        anesthesiologist was determined to be medically                        necessary for this procedure based on review of the                        patient's medical history, medications, and prior                        anesthesia history.                       After obtaining informed consent, the colonoscope was                        passed under direct vision. Throughout the procedure,                        the patient's blood pressure, pulse, and oxygen                        saturations were monitored continuously. The                        Colonoscope was introduced through the anus and                        advanced to the the cecum, identified by the ileocecal                        valve. The colonoscopy was performed with moderate                        difficulty due to a tortuous colon. The patient                        tolerated the procedure well. The quality of the bowel                        preparation was good. Findings:      The perianal and digital rectal examinations were normal.      A  3 mm polyp was found in the rectum. The polyp was sessile. The polyp       was removed with a hot snare. Resection and retrieval were complete.      A 4 mm polyp was found in the recto-sigmoid  colon. The polyp was       sessile. The polyp was removed with a hot snare. Resection and retrieval       were complete.      A 40 mm polyp was found in the sigmoid colon. The polyp was       semi-pedunculated. The polyp was removed with a hot snare. Resection and       retrieval were complete.      The exam was otherwise without abnormality. Impression:           - One 3 mm polyp in the rectum, removed with a hot                        snare. Resected and retrieved.                       - One 4 mm polyp at the recto-sigmoid colon, removed                        with a hot snare. Resected and retrieved.                       - One 40 mm polyp in the sigmoid colon, removed with a                        hot snare. Resected and retrieved.                       - The examination was otherwise normal. Recommendation:       - Await pathology results. Procedure Code(s):    --- Professional ---                       [phone], Colonoscopy, flexible; with removal of tumor(s),                        polyp(s), or other lesion(s) by snare technique Diagnosis Code(s):    --- Professional ---                       K62.1, Rectal polyp                       D12.7, Benign neoplasm of rectosigmoid junction                       D12.5, Benign neoplasm of sigmoid colon                       R93.3, Abnormal findings on diagnostic imaging of other                        parts of digestive tract CPT copyright 2016 American Medical Association. All rights reserved. The codes documented in this report are preliminary and upon coder review may  be revised to meet current compliance requirements. Christene Lye, MD 08/18/2015 5:17:20 PM This report has been signed electronically.  Number of Addenda: 0 Note Initiated On: 08/18/2015 3:42 PM Scope Withdrawal Time: 0 hours 9 minutes 23 seconds  Total Procedure Duration: 1 hour 16 minutes 26 seconds       [facility]

## 2015-08-18 NOTE — Anesthesia Preprocedure Evaluation (Signed)
Anesthesia Evaluation  Patient identified by MRN, date of birth, ID band Patient awake    Reviewed: Allergy & Precautions, H&P , NPO status , Patient's Chart, lab work & pertinent test results, reviewed documented beta blocker date and time   History of Anesthesia Complications Negative for: history of anesthetic complications  Airway Mallampati: III  TM Distance: >3 FB Neck ROM: full    Dental no notable dental hx. (+) Caps   Pulmonary shortness of breath and with exertion, neg sleep apnea, COPD, neg recent URI, former smoker,    Pulmonary exam normal breath sounds clear to auscultation       Cardiovascular Exercise Tolerance: Good hypertension, (-) angina+ DOE  (-) CAD, (-) Past MI, (-) Cardiac Stents and (-) CABG negative cardio ROS Normal cardiovascular exam(-) dysrhythmias (-) Valvular Problems/Murmurs Rhythm:regular Rate:Normal     Neuro/Psych negative neurological ROS  negative psych ROS   GI/Hepatic Neg liver ROS, GERD  ,  Endo/Other  negative endocrine ROS  Renal/GU negative Renal ROS  negative genitourinary   Musculoskeletal   Abdominal   Peds  Hematology negative hematology ROS (+)   Anesthesia Other Findings Past Medical History:   Hypertension                                                 Allergy                                                      GERD (gastroesophageal reflux disease)                       Vitamin D deficiency                                         Shortness of breath dyspnea                                  Coughing up blood                                            Breast cancer ([facility])                                          Arthritis                                                    COPD (chronic obstructive pulmonary disease) (*              Reproductive/Obstetrics negative OB ROS  Anesthesia Physical Anesthesia  Plan  ASA: II  Anesthesia Plan: General   Post-op Pain Management:    Induction:   Airway Management Planned:   Additional Equipment:   Intra-op Plan:   Post-operative Plan:   Informed Consent: I have reviewed the patients History and Physical, chart, labs and discussed the procedure including the risks, benefits and alternatives for the proposed anesthesia with the patient or authorized representative who has indicated his/her understanding and acceptance.   Dental Advisory Given  Plan Discussed with: Anesthesiologist, CRNA and Surgeon  Anesthesia Plan Comments:         Anesthesia Quick Evaluation

## 2015-08-18 NOTE — Interval H&P Note (Signed)
History and Physical Interval Note:  08/18/2015 3:38 PM  Connie Osborne  has presented today for surgery, with the diagnosis of ABNORMAL IMAGES  The various methods of treatment have been discussed with the patient and family. After consideration of risks, benefits and other options for treatment, the patient has consented to  Procedure(s): COLONOSCOPY WITH PROPOFOL (N/A) as a surgical intervention .  The patient's history has been reviewed, patient examined, no change in status, stable for surgery.  I have reviewed the patient's chart and labs.  Questions were answered to the patient's satisfaction.     Tahmid Stonehocker G

## 2015-08-18 NOTE — H&P (View-Only) (Signed)
Patient ID: Connie Osborne, female   DOB: 06/03/1960, 55 y.o.   MRN: [id number]  Chief Complaint  Patient presents with  . Follow-up    PET scan    HPI Connie Osborne is a 55 y.o. female.  Here today for follow up PET scan she states a few spots showed up. She denies any gastrointestinal issues. She can not feel anything different in the breast. Patient denies any GI symptoms.  She states she may have had a colonoscopy years ago.  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 She initially started on Tamoxifen but discontinued this few mos after. She did test pos for BRCA1. Also did not follow up here or with oncology  As scheduled. Recently she had blood tinged sputum and w/u revealed a RUL spiculated mass. Navigational biopsy showed adenoca, likely lepidic, TTF1 pos. Subsequent PET revealed hypermetabolic spots in left breast and proximal sigmoid colon.  I have reviewed the history of present illness with the patient.  Past Medical History  Diagnosis Date  . Hypertension   . Allergy   . GERD (gastroesophageal reflux disease)   . Vitamin D deficiency   . Shortness of breath dyspnea   . Coughing up blood   . Breast cancer ([facility])   . Arthritis   . COPD (chronic obstructive pulmonary disease) ([facility])     Past Surgical History  Procedure Laterality Date  . Abdominal hysterectomy    . Foot surgery    . Ganglion cyst excision    . Fracture surgery    . Breast surgery Left 2014    lumpectomy  . Electromagnetic navigation brochoscopy Right 07/28/2015    Procedure: ELECTROMAGNETIC NAVIGATION BRONCHOSCOPY;  Surgeon: Kurian Kasa, MD;  Location: [facility] ORS;  Service: Cardiopulmonary;  Laterality: Right;    Family History  Problem Relation Age of Onset  . Cancer Sister 34    breast  . Cancer Sister 34    breast  . Cancer Other     breast  . Lung cancer Brother   . Hypertension Mother   . Diabetes Mellitus II Mother   . Cancer Mother     mets  . Cancer Father      long cancer    Social History Social History  Substance Use Topics  . Smoking status: Former Smoker -- 1.00 packs/day for 30 years    Types: Cigarettes  . Smokeless tobacco: Never Used  . Alcohol Use: No    Allergies  Allergen Reactions  . Accupril [Quinapril Hcl] Cough  . Percocet [Oxycodone-Acetaminophen]     Current Outpatient Prescriptions  Medication Sig Dispense Refill  . albuterol (PROVENTIL HFA;VENTOLIN HFA) 108 (90 Base) MCG/ACT inhaler Inhale 2 puffs into the lungs every 4 (four) hours as needed for wheezing or shortness of breath. 1 Inhaler 6  . amLODipine (NORVASC) 10 MG tablet Take 10 mg by mouth daily.    . cetirizine (ZYRTEC) 10 MG tablet Take 10 mg by mouth as needed for allergies.    . losartan (COZAAR) 100 MG tablet Take 100 mg by mouth daily.    . metoprolol succinate (TOPROL-XL) 25 MG 24 hr tablet Take 25 mg by mouth daily.    . umeclidinium-vilanterol (ANORO ELLIPTA) 62.5-25 MCG/INH AEPB Inhale 1 puff into the lungs daily. 60 each 5  . polyethylene glycol powder (GLYCOLAX/MIRALAX) powder 255 grams one bottle for colonoscopy prep 255 g 0   No current facility-administered medications for this visit.    Review of Systems   Review of Systems  Constitutional: Negative.   Respiratory: Positive for cough.   Cardiovascular: Negative.     Blood pressure 144/86, pulse 64, resp. rate 12, height 5' 5" (1.651 m), weight 154 lb (69.854 kg).  Physical Exam Physical Exam  Constitutional: She is oriented to person, place, and time. She appears well-developed and well-nourished.  HENT:  Mouth/Throat: Oropharynx is clear and moist.  Eyes: Conjunctivae are normal. No scleral icterus.  Neck: Neck supple.  Cardiovascular: Normal rate, regular rhythm and normal heart sounds.   Pulmonary/Chest: Effort normal and breath sounds normal. Right breast exhibits no inverted nipple, no mass, no nipple discharge, no skin change and no tenderness. Left breast exhibits mass. Left  breast exhibits no inverted nipple, no nipple discharge, no skin change and no tenderness.    Left breast: Lumpectomy site  2cm hard mass , 30cl location  Abdominal: Soft. Bowel sounds are normal. She exhibits no distension. There is no tenderness.  Lymphadenopathy:    She has no cervical adenopathy.    She has no axillary adenopathy.  Neurological: She is alert and oriented to person, place, and time.  Skin: Skin is warm and dry.  Psychiatric: Her behavior is normal.    Data Reviewed Prior notes and PET scan.  Assessment   BRCA 1 carroer Left breast mass at 3 o'clock position-this is likely fat necrosis. Mammogram scheduled. Sigmoid colon finding needs colonoscopy. Lung CA- this appears to be lung primary. Treatment to be decided after colonoscopy and mammogram. All of this discussed fully with pt.     Plan   CBC, Met C, CEA, CA27-29 collected today.  Recommend colonoscopy based on PET scan results for evaluation. Mammogram scheduled for 6/22 and recommend scheduling of core biopsy of left breast mass. Follow-up in the office.   Colonoscopy with possible biopsy/polypectomy prn: Information regarding the procedure, including its potential risks and complications (including but not limited to perforation of the bowel, which may require emergency surgery to repair, and bleeding) was verbally given to the patient. Educational information regarding lower intestinal endoscopy was given to the patient. Written instructions for how to complete the bowel prep using Miralax were provided. The importance of drinking ample fluids to avoid dehydration as a result of the prep emphasized.  Patient has been scheduled for a colonoscopy on 08-18-15 at [facility].  >45minutes spent in this encounter-most on discussion, record review and coordination of planned work up.    PCP:  Khan, Farah Ref Dr Brahmanday  This information has been scribed by Marsha Hatch RN, BSN,BC.    SANKAR,SEEPLAPUTHUR  G 08/13/2015, 10:25 AM    

## 2015-08-18 NOTE — Anesthesia Postprocedure Evaluation (Signed)
Anesthesia Post Note  Patient: Connie Osborne  Procedure(s) Performed: Procedure(s) (LRB): COLONOSCOPY WITH PROPOFOL (N/A)  Patient location during evaluation: Endoscopy Anesthesia Type: General Level of consciousness: awake and alert Pain management: pain level controlled Vital Signs Assessment: post-procedure vital signs reviewed and stable Respiratory status: spontaneous breathing, nonlabored ventilation, respiratory function stable and patient connected to nasal cannula oxygen Cardiovascular status: blood pressure returned to baseline and stable Postop Assessment: no signs of nausea or vomiting Anesthetic complications: no    Last Vitals:  Filed Vitals:   08/18/15 1743 08/18/15 1753  BP: 159/82 147/91  Pulse: 68 70  Temp:    Resp: 15 16    Last Pain: There were no vitals filed for this visit.               Martha Clan

## 2015-08-20 ENCOUNTER — Ambulatory Visit
Admission: RE | Admit: 2015-08-20 | Discharge: 2015-08-20 | Disposition: A | Discharge status: Home / Self Care | Payer: BLUE CROSS/BLUE SHIELD | Source: Ambulatory Visit | Attending: Internal Medicine | Admitting: Internal Medicine

## 2015-08-20 ENCOUNTER — Encounter: Payer: Self-pay | Admitting: General Surgery

## 2015-08-20 ENCOUNTER — Other Ambulatory Visit: Payer: Self-pay | Admitting: Internal Medicine

## 2015-08-20 DIAGNOSIS — D0510 Intraductal carcinoma in situ of unspecified breast: Secondary | ICD-10-CM

## 2015-08-20 DIAGNOSIS — N63 Unspecified lump in unspecified breast: Secondary | ICD-10-CM

## 2015-08-21 ENCOUNTER — Telehealth: Payer: Self-pay

## 2015-08-21 LAB — SURGICAL PATHOLOGY

## 2015-08-21 NOTE — Telephone Encounter (Signed)
Call to patient to go over surgery date and instructions. The patient is scheduled for surgery at [facility] on 09/14/15. She will pre admit by phone. She will be seen here in the office for a pre op appointment with Dr [name] on 09/07/15 at 9:00 am. Surgery instructions have been mailed to the patient. She is aware of dates, time, and instructions.

## 2015-08-24 ENCOUNTER — Telehealth: Payer: Self-pay | Admitting: *Deleted

## 2015-08-24 ENCOUNTER — Telehealth: Payer: Self-pay | Admitting: General Surgery

## 2015-08-24 ENCOUNTER — Encounter: Payer: Self-pay | Admitting: General Surgery

## 2015-08-24 NOTE — Telephone Encounter (Signed)
08-24-15 @ 8:25AM PT CALLED & WANTED TO CHECK WITH YOU ABOUT HER PET SCAN DONE 08-10-15 @ [place].SHE STATED SHE WAS TOLD SHE HAD A MASS ON HER [place] & WANTED TO KNOW IF YOU WHERE GOING TO CHECK THAT OUT?

## 2015-08-24 NOTE — Telephone Encounter (Signed)
Contacted by patient. Reviewed plan of care and answered questions as needed. Will follow and patient will call for any needs identified.

## 2015-08-28 ENCOUNTER — Inpatient Hospital Stay (HOSPITAL_BASED_OUTPATIENT_CLINIC_OR_DEPARTMENT_OTHER): Payer: BLUE CROSS/BLUE SHIELD | Admitting: Internal Medicine

## 2015-08-28 VITALS — BP 121/77 | HR 64 | Temp 97.2°F | Resp 18 | Wt 157.8 lb

## 2015-08-28 DIAGNOSIS — Z9119 Patient's noncompliance with other medical treatment and regimen: Secondary | ICD-10-CM

## 2015-08-28 DIAGNOSIS — I10 Essential (primary) hypertension: Secondary | ICD-10-CM

## 2015-08-28 DIAGNOSIS — Z86000 Personal history of in-situ neoplasm of breast: Secondary | ICD-10-CM

## 2015-08-28 DIAGNOSIS — F17210 Nicotine dependence, cigarettes, uncomplicated: Secondary | ICD-10-CM

## 2015-08-28 DIAGNOSIS — Z9223 Personal history of estrogen therapy: Secondary | ICD-10-CM

## 2015-08-28 DIAGNOSIS — C3411 Malignant neoplasm of upper lobe, right bronchus or lung: Secondary | ICD-10-CM

## 2015-08-28 DIAGNOSIS — Z79899 Other long term (current) drug therapy: Secondary | ICD-10-CM

## 2015-08-28 DIAGNOSIS — M1990 Unspecified osteoarthritis, unspecified site: Secondary | ICD-10-CM

## 2015-08-28 DIAGNOSIS — J449 Chronic obstructive pulmonary disease, unspecified: Secondary | ICD-10-CM

## 2015-08-28 DIAGNOSIS — K219 Gastro-esophageal reflux disease without esophagitis: Secondary | ICD-10-CM

## 2015-08-28 DIAGNOSIS — Z148 Genetic carrier of other disease: Secondary | ICD-10-CM

## 2015-08-28 DIAGNOSIS — R0602 Shortness of breath: Secondary | ICD-10-CM

## 2015-08-28 NOTE — Assessment & Plan Note (Addendum)
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 Discussed with Dr. [name].   # DCIS s/p lumpec & RT- non-compliant with Tamoxifen. Currently hold off sec to planed surgery of lung on July 17th  # BRCA-1- Positive; brother recently diagnosed with cancer regarding prophylactic mastectomy/oophorectomy especially given the significant noncompliance. Also discussed she might benefit from intensive surveillance with MRI of the breasts.   # Follow-up with me in second week of August- need for adjuvant chemotherapy for the lung would be based upon final surgical pathology.

## 2015-08-28 NOTE — Progress Notes (Signed)
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  female patient with long-standing history of smoking and newly diagnosed right upper lobe lung cancer- found to have abnormal PET scan- left breast uptake; sigmoid colon update- along with right upper lobe lung lesion and right paratracheal lymph node uptake.  Patient underwent colonoscopy that was negative; she also underwent mammogram of the pelvis was again negative. She is planning to proceed with surgery in the next few weeks. Patient has quit smoking. Patient has mild shortness of breath; the hemoptysis has resolved. No significant cough. Denies any lumps or bumps. She is currently not on tamoxifen.  Otherwise no nausea no vomiting. No abdominal pain. No headache. No constipation diarrhea.  ROS: A complete 10 point review of system is done which is negative except mentioned above in history of present illness  MEDICAL HISTORY:  Past Medical History  Diagnosis Date  . Hypertension   . Allergy   . GERD (gastroesophageal reflux disease)   . Vitamin D deficiency   . Shortness of breath dyspnea   . Coughing up blood   . Arthritis   . COPD (chronic obstructive pulmonary disease) ([place])   . Breast cancer ([place]) 2014     Left- Radiation    SURGICAL HISTORY: Past Surgical History  Procedure Laterality Date  . Abdominal hysterectomy    . Foot surgery    . Ganglion cyst excision    . Fracture surgery    . Breast surgery Left 2014    lumpectomy  . Electromagnetic navigation brochoscopy Right 07/28/2015    Procedure: ELECTROMAGNETIC NAVIGATION BRONCHOSCOPY;  Surgeon: Flora Lipps, MD;  Location: [facility] ORS;  Service: Cardiopulmonary;  Laterality: Right;  . Colonoscopy with propofol N/A 08/18/2015    Procedure: COLONOSCOPY WITH PROPOFOL;  Surgeon: Christene Lye, MD;  Location: [facility] ENDOSCOPY;  Service: Endoscopy;  Laterality: N/A;  . Breast biopsy Left 2014    +  . Breast excisional biopsy Left 2014    SOCIAL HISTORY: Social History   Social History  . Marital Status: Divorced    Spouse Name: N/A  . Number of Children: N/A  . Years of Education: N/A   Occupational History  . Not on file.   Social History Main Topics  . Smoking status: Former Smoker -- 1.00 packs/day for 30 years    Types: Cigarettes    Quit date: 07/30/2015  . Smokeless tobacco: Never Used  . Alcohol Use: No  . Drug Use: No  . Sexual Activity: Not on file   Other Topics Concern  . Not on file   Social History Narrative    FAMILY HISTORY: Family History  Problem Relation Age of Onset  . Cancer Sister 6    breast  . Breast cancer Sister 72    30  .  Cancer Sister 74    breast  . Cancer Other     breast  . Lung cancer Brother   . Hypertension Mother   . Diabetes Mellitus II Mother   . Cancer Mother     mets  . Cancer Father     long cancer    ALLERGIES:  is allergic to accupril and percocet.  MEDICATIONS:  Current Outpatient Prescriptions  Medication Sig Dispense Refill  . amLODipine (NORVASC) 10 MG tablet Take 10 mg by mouth daily.    . cetirizine (ZYRTEC) 10 MG tablet Take 10 mg by mouth as needed for allergies.    Marland Kitchen losartan (COZAAR) 100 MG tablet Take 100 mg by mouth daily.    . metoprolol  succinate (TOPROL-XL) 25 MG 24 hr tablet Take 25 mg by mouth daily.    Marland Kitchen umeclidinium-vilanterol (ANORO ELLIPTA) 62.5-25 MCG/INH AEPB Inhale 1 puff into the lungs daily. 60 each 5   No current facility-administered medications for this visit.      Marland Kitchen  PHYSICAL EXAMINATION: ECOG PERFORMANCE STATUS: 0 - Asymptomatic  Filed Vitals:   08/28/15 1509  BP: 121/77  Pulse: 64  Temp: 97.2 F (36.2 C)  Resp: 18   Filed Weights   08/28/15 1509  Weight: 157 lb 13.6 oz (71.6 kg)    GENERAL: Well-nourished well-developed; Alert, no distress and comfortable.   Alone.  EYES: no pallor or icterus OROPHARYNX: no thrush or ulceration; good dentition  NECK: supple, no masses felt LYMPH:  no palpable lymphadenopathy in the cervical, axillary or inguinal regions LUNGS: clear to auscultation and  No wheeze or crackles HEART/CVS: regular rate & rhythm and no murmurs; No lower extremity edema ABDOMEN: abdomen soft, non-tender and normal bowel sounds Musculoskeletal:no cyanosis of digits and no clubbing  PSYCH: alert & oriented x 3 with fluent speech NEURO: no focal motor/sensory deficits SKIN:  no rashes or significant lesions Right and left BREAST exam [in the presence of nurse]- no unusual skin changes or dominant masses felt. Surgical scars noted in left breast.    LABORATORY DATA:  I have reviewed the data as listed Lab Results  Component Value Date   WBC 6.7 08/13/2015   HGB 12.7 06/30/2015   HCT 35.2 08/13/2015   MCV 95 08/13/2015   PLT 366 08/13/2015    Recent Labs  06/30/15 0807 08/13/15 1017  NA 142 143  K 3.4* 4.7  CL 108 103  CO2 26 26  GLUCOSE 128* 104*  BUN 11 12  CREATININE 0.61 0.69  CALCIUM 9.6 9.9  GFRNONAA >60 98  GFRAA >60 113  PROT  --  6.9  ALBUMIN  --  4.4  AST  --  13  ALT  --  13  ALKPHOS  --  121*  BILITOT  --  <0.2    RADIOGRAPHIC STUDIES: I have personally reviewed the radiological images as listed and agreed with the findings in the  report. Nm Pet Image Initial (pi) Skull Base To Thigh  08/10/2015  CLINICAL DATA:  Initial treatment strategy for right upper lobe adenocarcinoma. Staging examination. EXAM: NUCLEAR MEDICINE PET SKULL BASE TO THIGH TECHNIQUE: 12.04 mCi F-18 FDG was injected intravenously. Full-ring PET imaging was performed from the skull base to thigh after the radiotracer. CT data was obtained and used for attenuation correction and anatomic localization. FASTING BLOOD GLUCOSE:  Value: 93 mg/dl COMPARISON:  Chest CT 06/30/2015. FINDINGS: NECK No hypermetabolic lymph nodes in the neck. CHEST In the right upper lobe there is again  a ill-defined mass-like area of ground-glass attenuation and extensive septal thickening which measures up to 2.7 x 4.1 cm (axial image 58 of series 3), and has within it a 1 cm focus that is more solid in appearance (axial image 58 of series 3). This entire region demonstrates mild hypermetabolism, particularly the central nodular focus (SUVmax = 4.0). No other suspicious appearing pulmonary nodules or masses are noted. Some irregular septal thickening is noted in the periphery of the right upper lobe, which could reflect rather widespread lepidic growth of this lesion. There is a nonenlarged low right paratracheal lymph node measuring 9 mm in short axis which demonstrates some low-level metabolic activity (SUVmax = 3.4), concerning for metastasis. No other hilar or mediastinal hypermetabolic lymphadenopathy is noted. Mild diffuse bronchial wall thickening with mild centrilobular and paraseptal emphysema. No acute consolidative airspace disease. No pleural effusions. 2.1 x 2.1 cm nodular area in the left breast (image 78 of series 3) demonstrates hypermetabolism (SUVmax = 2.9). ABDOMEN/PELVIS No abnormal hypermetabolic activity within the liver, pancreas, adrenal glands, or spleen. No hypermetabolic lymph nodes in the abdomen or pelvis. 3 mm nonobstructive calculus in the lower pole collecting system of  the right kidney. In the region of the proximal sigmoid colon there is a 2 cm rounded soft tissue attenuation lesion (image 205 of series 3) which demonstrates hypermetabolism (SUVmax = 10.9), concerning for primary colonic neoplasm. No pathologic dilatation of small bowel or colon. Normal appendix. Atherosclerosis throughout the abdominal and pelvic vasculature, without evidence of aneurysm. No significant volume of ascites. No pneumoperitoneum. SKELETON No focal hypermetabolic activity to suggest skeletal metastasis. IMPRESSION: 1. Hypermetabolic mixed subsolid and solid lesion in the right upper lobe, compatible with the reported clinical history of biopsy-proven adenocarcinoma. Hypermetabolic low right paratracheal lymph node is likely metastatic. There is also an extensive septal thickening throughout the right upper lobe, suggesting extensive lepidic growth of the lesion. Findings are indicative of at least T2a, N2, Mx disease (i.e., at least stage IIIA). 2. In addition, there is a small hypermetabolic 2.1 x 2.1 cm nodular area in the left breast, which could represent a primary breast neoplasm. Correlation with mammography is recommended in the near future. 3. There is also a 2 cm rounded soft tissue attenuation lesion in the proximal sigmoid colon which demonstrates hypermetabolism, concerning for potential primary colonic neoplasm. Correlation with nonemergent colonoscopy is strongly recommended in the near future. 4. Mild diffuse bronchial thickening with mild centrilobular and paraseptal emphysema; imaging findings suggestive of underlying COPD. 5. Nonobstructive 3 mm calculus in the lower pole collecting system of the right kidney. Electronically Signed   By: Vinnie Langton M.D.   On: 08/10/2015 09:57   US Breast Ltd Uni Left Inc Axilla  08/20/2015  CLINICAL DATA:  55 year old female with history of left lumpectomy in 2014. The patient has a recently diagnosed adenocarcinoma of the right upper lobe.  On recent PET-CT, a small hypermetabolic area was seen within the left breast in the region of the lumpectomy bed. Correlation with mammography was recommended. The patient also reports that she is BRCA 1 positive. EXAM: 2D DIGITAL DIAGNOSTIC BILATERAL MAMMOGRAM WITH CAD AND ADJUNCT TOMO ULTRASOUND LEFT BREAST COMPARISON:  Previous exam(s). ACR Breast Density Category c: The breast tissue is heterogeneously dense, which may obscure small masses. FINDINGS: Postlumpectomy changes are again noted of the upper, outer left breast. Within the lumpectomy bed, there is a 0.7 cm oil cyst and a 1.3 cm oil cyst. No suspicious abnormality is identified within the  left breast. No suspicious mass, calcifications, or other abnormality is identified within the right breast. Mammographic images were processed with CAD. On physical exam, there is an approximately 1.5 cm firm nodule at the lumpectomy site at 3 o'clock, 5 cm from the nipple. The patient states that this has been present since surgery and is unchanged. Targeted ultrasound is performed showing an oval, circumscribed, mixed echogenicity mass at 3 o'clock, 5 cm from the nipple measuring 1.3 x 1.1 x 1 cm, corresponding to the palpable abnormality and larger oil cyst seen mammographically. At this same location, slightly deeper within the breast, there is a 6 x 6 x 5 mm oval, circumscribed, hypoechoic mass with increased through transmission consistent with the second oil cyst seen mammographically. No suspicious cystic or solid sonographic mass is identified. IMPRESSION: Left breast oil cysts. No suspicious mammographic or sonographic finding is identified in the region of the hypermetabolic activity seen on recent PET-CT. RECOMMENDATION: Bilateral breast MRI with contrast. This will further evaluate the left lumpectomy bed and serve as a baseline exam in this patient who is BRCA 1 positive (for which annual high risk screening breast MRI is recommended). If no suspicious  finding is seen on breast MRI, a bilateral diagnostic mammogram is recommended in 1 year. I have discussed the findings and recommendations with the patient. Results were also provided in writing at the conclusion of the visit. If applicable, a reminder letter will be sent to the patient regarding the next appointment. BI-RADS CATEGORY  0: Incomplete. Need additional imaging evaluation and/or prior mammograms for comparison. Electronically Signed   By: Pamelia Hoit M.D.   On: 08/20/2015 13:03   Mm Diag Breast Tomo Bilateral  08/20/2015  CLINICAL DATA:  55 year old female with history of left lumpectomy in 2014. The patient has a recently diagnosed adenocarcinoma of the right upper lobe. On recent PET-CT, a small hypermetabolic area was seen within the left breast in the region of the lumpectomy bed. Correlation with mammography was recommended. The patient also reports that she is BRCA 1 positive. EXAM: 2D DIGITAL DIAGNOSTIC BILATERAL MAMMOGRAM WITH CAD AND ADJUNCT TOMO ULTRASOUND LEFT BREAST COMPARISON:  Previous exam(s). ACR Breast Density Category c: The breast tissue is heterogeneously dense, which may obscure small masses. FINDINGS: Postlumpectomy changes are again noted of the upper, outer left breast. Within the lumpectomy bed, there is a 0.7 cm oil cyst and a 1.3 cm oil cyst. No suspicious abnormality is identified within the left breast. No suspicious mass, calcifications, or other abnormality is identified within the right breast. Mammographic images were processed with CAD. On physical exam, there is an approximately 1.5 cm firm nodule at the lumpectomy site at 3 o'clock, 5 cm from the nipple. The patient states that this has been present since surgery and is unchanged. Targeted ultrasound is performed showing an oval, circumscribed, mixed echogenicity mass at 3 o'clock, 5 cm from the nipple measuring 1.3 x 1.1 x 1 cm, corresponding to the palpable abnormality and larger oil cyst seen mammographically.  At this same location, slightly deeper within the breast, there is a 6 x 6 x 5 mm oval, circumscribed, hypoechoic mass with increased through transmission consistent with the second oil cyst seen mammographically. No suspicious cystic or solid sonographic mass is identified. IMPRESSION: Left breast oil cysts. No suspicious mammographic or sonographic finding is identified in the region of the hypermetabolic activity seen on recent PET-CT. RECOMMENDATION: Bilateral breast MRI with contrast. This will further evaluate the left lumpectomy bed and serve  as a baseline exam in this patient who is BRCA 1 positive (for which annual high risk screening breast MRI is recommended). If no suspicious finding is seen on breast MRI, a bilateral diagnostic mammogram is recommended in 1 year. I have discussed the findings and recommendations with the patient. Results were also provided in writing at the conclusion of the visit. If applicable, a reminder letter will be sent to the patient regarding the next appointment. BI-RADS CATEGORY  0: Incomplete. Need additional imaging evaluation and/or prior mammograms for comparison. Electronically Signed   By: Pamelia Hoit M.D.   On: 08/20/2015 13:03    ASSESSMENT & PLAN:   Lung cancer, upper lobe ([place]) # Right upper lobe adenocarcinoma-most likely lung primary; T1 N1 [based on the CT scan]- awaiting surgery on July 17. Discussed with Dr. [name].   # DCIS s/p lumpec & RT- non-compliant with Tamoxifen. Currently hold off sec to planed surgery of lung on July 17th  # BRCA-1- Positive; brother recently diagnosed with cancer regarding prophylactic mastectomy/oophorectomy especially given the significant noncompliance. Also discussed she might benefit from intensive surveillance with MRI of the breasts.   # Follow-up with me in second week of August- need for adjuvant chemotherapy for the lung would be based upon final surgical pathology.       Cammie Sickle, MD 08/28/2015  5:22 PM

## 2015-09-04 ENCOUNTER — Encounter: Payer: Self-pay | Admitting: *Deleted

## 2015-09-04 ENCOUNTER — Other Ambulatory Visit: Payer: BLUE CROSS/BLUE SHIELD

## 2015-09-04 NOTE — Patient Instructions (Signed)
  Your procedure is scheduled on: 09-14-15 [facility]) Report to Same Day Surgery 2nd floor medical mall To find out your arrival time please call [phone] between 1PM - 3PM on 09-11-15 (FRIDAY)  Remember: Instructions that are not followed completely may result in serious medical risk, up to and including death, or upon the discretion of your surgeon and anesthesiologist your surgery may need to be rescheduled.    _x___ 1. Do not eat food or drink liquids after midnight. No gum chewing or hard candies.     __x__ 2. No Alcohol for 24 hours before or after surgery.   __x__3. No Smoking for 24 prior to surgery.   ____  4. Bring all medications with you on the day of surgery if instructed.    __x__ 5. Notify your doctor if there is any change in your medical condition     (cold, fever, infections).     Do not wear jewelry, make-up, hairpins, clips or nail polish.  Do not wear lotions, powders, or perfumes. You may wear deodorant.  Do not shave 48 hours prior to surgery. Men may shave face and neck.  Do not bring valuables to the hospital.    [facility] is not responsible for any belongings or valuables.               Contacts, dentures or bridgework may not be worn into surgery.  Leave your suitcase in the car. After surgery it may be brought to your room.  For patients admitted to the hospital, discharge time is determined by your treatment team.   Patients discharged the day of surgery will not be allowed to drive home.    Please read over the following fact sheets that you were given:   [facility] Preparing for Surgery and or MRSA Information   _x___ Take these medicines the morning of surgery with A SIP OF WATER:    1. AMLODIPINE (NORVASC)  2.LOSARTAN (COZAAR)  3.METOPROLOL  4.  5.  6.  ____ Fleet Enema (as directed)   _x___ Use CHG Soap or sage wipes as directed on instruction sheet   _X___ Use inhalers on the day of surgery and bring to hospital day of  surgery  ____ Stop metformin 2 days prior to surgery    ____ Take 1/2 of usual insulin dose the night before surgery and none on the morning of surgery.   ____ Stop aspirin or coumadin, or plavix  _x__ Stop Anti-inflammatories such as Advil, Aleve, Ibuprofen, Motrin, Naproxen,          Naprosyn, Goodies powders or aspirin products. Ok to take Tylenol.   ____ Stop supplements until after surgery.    ____ Bring C-Pap to the hospital.

## 2015-09-07 ENCOUNTER — Encounter: Payer: Self-pay | Admitting: General Surgery

## 2015-09-07 ENCOUNTER — Ambulatory Visit (INDEPENDENT_AMBULATORY_CARE_PROVIDER_SITE_OTHER): Payer: BLUE CROSS/BLUE SHIELD | Admitting: General Surgery

## 2015-09-07 ENCOUNTER — Other Ambulatory Visit: Payer: Self-pay | Admitting: General Surgery

## 2015-09-07 ENCOUNTER — Encounter
Admission: RE | Admit: 2015-09-07 | Discharge: 2015-09-07 | Disposition: A | Discharge status: Home / Self Care | Payer: BLUE CROSS/BLUE SHIELD | Source: Ambulatory Visit | Attending: General Surgery | Admitting: General Surgery

## 2015-09-07 VITALS — BP 124/72 | HR 76 | Resp 14 | Ht 60.0 in | Wt 160.0 lb

## 2015-09-07 DIAGNOSIS — C3411 Malignant neoplasm of upper lobe, right bronchus or lung: Secondary | ICD-10-CM

## 2015-09-07 DIAGNOSIS — Z01810 Encounter for preprocedural cardiovascular examination: Secondary | ICD-10-CM | POA: Diagnosis present

## 2015-09-07 NOTE — Patient Instructions (Signed)
Patient is scheduled for surgery on 09/14/15.

## 2015-09-07 NOTE — Progress Notes (Signed)
Patient ID: Connie Osborne, female   DOB: 1960-11-22, 55 y.o.   MRN: [id number]  Chief Complaint  Patient presents with  . Pre-op Exam    Thoracotomy    HPI Connie Osborne is a 55 y.o. female here today for her pre op right thoracotomy scheduled for 09/14/15.  Patient states she feels like she is started to get a sinus infection.  I have reviewed the history of present illness with the patient.  HPI  Past Medical History  Diagnosis Date  . Hypertension   . Allergy   . GERD (gastroesophageal reflux disease)   . Vitamin D deficiency   . Shortness of breath dyspnea   . Coughing up blood   . Arthritis   . COPD (chronic obstructive pulmonary disease) ([place])   . Breast cancer [facility]) 2014    Left- Radiation    Past Surgical History  Procedure Laterality Date  . Abdominal hysterectomy    . Foot surgery    . Ganglion cyst excision    . Fracture surgery    . Breast surgery Left 2014    lumpectomy  . Electromagnetic navigation brochoscopy Right 07/28/2015    Procedure: ELECTROMAGNETIC NAVIGATION BRONCHOSCOPY;  Surgeon: Flora Lipps, MD;  Location: [facility] ORS;  Service: Cardiopulmonary;  Laterality: Right;  . Colonoscopy with propofol N/A 08/18/2015    Procedure: COLONOSCOPY WITH PROPOFOL;  Surgeon: Christene Lye, MD;  Location: [facility] ENDOSCOPY;  Service: Endoscopy;  Laterality: N/A;  . Breast biopsy Left 2014    +  . Breast excisional biopsy Left 2014    Family History  Problem Relation Age of Onset  . Cancer Sister 62    breast  . Breast cancer Sister 66    30  . Cancer Sister 26    breast  . Cancer Other     breast  . Lung cancer Brother   . Hypertension Mother   . Diabetes Mellitus II Mother   . Cancer Mother     mets  . Cancer Father     long cancer    Social History Social History  Substance Use Topics  . Smoking status: Former Smoker -- 1.00 packs/day for 30 years    Types: Cigarettes    Quit date: 07/30/2015  . Smokeless tobacco: Never Used  . Alcohol  Use: No    Allergies  Allergen Reactions  . Accupril [Quinapril Hcl] Cough  . Percocet [Oxycodone-Acetaminophen] Other (See Comments)    Reaction: Unknown    Current Outpatient Prescriptions  Medication Sig Dispense Refill  . amLODipine (NORVASC) 10 MG tablet Take 10 mg by mouth daily with lunch.     . cetirizine (ZYRTEC) 10 MG tablet Take 10 mg by mouth as needed for allergies.    Marland Kitchen losartan (COZAAR) 100 MG tablet Take 100 mg by mouth daily with lunch.     . metoprolol succinate (TOPROL-XL) 25 MG 24 hr tablet Take 25 mg by mouth daily with lunch.     . umeclidinium-vilanterol (ANORO ELLIPTA) 62.5-25 MCG/INH AEPB Inhale 1 puff into the lungs daily. (Patient taking differently: Inhale 1 puff into the lungs daily with lunch. ) 60 each 5   No current facility-administered medications for this visit.    Review of Systems Review of Systems  Constitutional: Negative.   Respiratory: Positive for cough. Negative for apnea, choking, chest tightness, shortness of breath, wheezing and stridor.   Cardiovascular: Negative.   Gastrointestinal: Negative.     Blood pressure 124/72, pulse 76, resp. rate 14,  height 5' (1.524 m), weight 160 lb (72.576 kg).  Physical Exam Physical Exam  Constitutional: She is oriented to person, place, and time. She appears well-developed and well-nourished.  Eyes: Conjunctivae are normal. No scleral icterus.  Neck: Neck supple.  Cardiovascular: Normal rate, regular rhythm and normal heart sounds.   Pulmonary/Chest: Effort normal and breath sounds normal.  Abdominal: Soft. Normal appearance and bowel sounds are normal. There is no hepatomegaly. There is no tenderness. No hernia.  Lymphadenopathy:    She has no cervical adenopathy.  Neurological: She is alert and oriented to person, place, and time.  Skin: Skin is warm and dry.    Data Reviewed  .    Assessment    Right upper lobe adenocarcinoma  History of breast cancer.   Plan    Patient's  thoracotomy surgery is scheduled for 09/14/15 at [facility]. Procedure and risks discussed fully again. She is asked to call if her cold symptoms worsen in next couple of days        PCP:  [facility]  This information has been scribed by Gaspar Cola CMA.    Garfield Coiner G 09/07/2015, 10:18 AM

## 2015-09-08 ENCOUNTER — Telehealth: Payer: Self-pay | Admitting: *Deleted

## 2015-09-08 LAB — CBC WITH DIFFERENTIAL/PLATELET
BASOS ABS: 0.1 10*3/uL (ref 0.0–0.2)
Basos: 1 %
EOS (ABSOLUTE): 0.1 10*3/uL (ref 0.0–0.4)
Eos: 1 %
HEMATOCRIT: 38.3 % (ref 34.0–46.6)
Hemoglobin: 12.8 g/dL (ref 11.1–15.9)
IMMATURE GRANULOCYTES: 0 %
Immature Grans (Abs): 0 10*3/uL (ref 0.0–0.1)
LYMPHS: 23 %
Lymphocytes Absolute: 1.6 10*3/uL (ref 0.7–3.1)
MCH: 32 pg (ref 26.6–33.0)
MCHC: 33.4 g/dL (ref 31.5–35.7)
MCV: 96 fL (ref 79–97)
MONOS ABS: 0.7 10*3/uL (ref 0.1–0.9)
Monocytes: 10 %
NEUTROS PCT: 65 %
Neutrophils Absolute: 4.5 10*3/uL (ref 1.4–7.0)
Platelets: 325 10*3/uL (ref 150–379)
RBC: 4 x10E6/uL (ref 3.77–5.28)
RDW: 13.2 % (ref 12.3–15.4)
WBC: 6.9 10*3/uL (ref 3.4–10.8)

## 2015-09-08 NOTE — Progress Notes (Signed)
Quick Note:  Inform pt labs are normal. F/u as scheduled ______

## 2015-09-08 NOTE — Telephone Encounter (Signed)
-----   Message from Christene Lye, MD sent at 09/08/2015  7:39 AM EDT ----- Inform pt labs are normal. F/u as scheduled

## 2015-09-08 NOTE — Telephone Encounter (Signed)
Informed patient as instructed, patient verbalized understanding. Will follow up as scheduled.

## 2015-09-11 LAB — ABO/RH: ABO/RH(D): O POS

## 2015-09-14 ENCOUNTER — Inpatient Hospital Stay
Admission: RE | Admit: 2015-09-14 | Discharge: 2015-09-22 | DRG: 164 | Disposition: A | Discharge status: Home / Self Care | Payer: BLUE CROSS/BLUE SHIELD | Source: Ambulatory Visit | Attending: General Surgery | Admitting: General Surgery

## 2015-09-14 ENCOUNTER — Inpatient Hospital Stay: Payer: BLUE CROSS/BLUE SHIELD

## 2015-09-14 ENCOUNTER — Inpatient Hospital Stay: Payer: BLUE CROSS/BLUE SHIELD | Admitting: Certified Registered Nurse Anesthetist

## 2015-09-14 ENCOUNTER — Encounter: Payer: Self-pay | Admitting: *Deleted

## 2015-09-14 ENCOUNTER — Encounter
Admission: RE | Disposition: A | Discharge status: Home / Self Care | Payer: Self-pay | Source: Ambulatory Visit | Attending: General Surgery

## 2015-09-14 DIAGNOSIS — Z902 Acquired absence of lung [part of]: Secondary | ICD-10-CM

## 2015-09-14 DIAGNOSIS — Z923 Personal history of irradiation: Secondary | ICD-10-CM

## 2015-09-14 DIAGNOSIS — K219 Gastro-esophageal reflux disease without esophagitis: Secondary | ICD-10-CM | POA: Diagnosis present

## 2015-09-14 DIAGNOSIS — Z79899 Other long term (current) drug therapy: Secondary | ICD-10-CM | POA: Diagnosis not present

## 2015-09-14 DIAGNOSIS — Z87891 Personal history of nicotine dependence: Secondary | ICD-10-CM | POA: Diagnosis not present

## 2015-09-14 DIAGNOSIS — J939 Pneumothorax, unspecified: Secondary | ICD-10-CM | POA: Diagnosis present

## 2015-09-14 DIAGNOSIS — Z8249 Family history of ischemic heart disease and other diseases of the circulatory system: Secondary | ICD-10-CM | POA: Diagnosis not present

## 2015-09-14 DIAGNOSIS — J9382 Other air leak: Secondary | ICD-10-CM | POA: Diagnosis present

## 2015-09-14 DIAGNOSIS — Z833 Family history of diabetes mellitus: Secondary | ICD-10-CM | POA: Diagnosis not present

## 2015-09-14 DIAGNOSIS — Z803 Family history of malignant neoplasm of breast: Secondary | ICD-10-CM | POA: Diagnosis not present

## 2015-09-14 DIAGNOSIS — Z09 Encounter for follow-up examination after completed treatment for conditions other than malignant neoplasm: Secondary | ICD-10-CM

## 2015-09-14 DIAGNOSIS — M1990 Unspecified osteoarthritis, unspecified site: Secondary | ICD-10-CM | POA: Diagnosis present

## 2015-09-14 DIAGNOSIS — Z853 Personal history of malignant neoplasm of breast: Secondary | ICD-10-CM | POA: Diagnosis not present

## 2015-09-14 DIAGNOSIS — Z90710 Acquired absence of both cervix and uterus: Secondary | ICD-10-CM

## 2015-09-14 DIAGNOSIS — Z9889 Other specified postprocedural states: Secondary | ICD-10-CM | POA: Diagnosis not present

## 2015-09-14 DIAGNOSIS — J449 Chronic obstructive pulmonary disease, unspecified: Secondary | ICD-10-CM | POA: Diagnosis present

## 2015-09-14 DIAGNOSIS — Z801 Family history of malignant neoplasm of trachea, bronchus and lung: Secondary | ICD-10-CM

## 2015-09-14 DIAGNOSIS — C3411 Malignant neoplasm of upper lobe, right bronchus or lung: Secondary | ICD-10-CM | POA: Diagnosis not present

## 2015-09-14 DIAGNOSIS — I10 Essential (primary) hypertension: Secondary | ICD-10-CM | POA: Diagnosis present

## 2015-09-14 DIAGNOSIS — K635 Polyp of colon: Secondary | ICD-10-CM | POA: Diagnosis present

## 2015-09-14 DIAGNOSIS — C3490 Malignant neoplasm of unspecified part of unspecified bronchus or lung: Secondary | ICD-10-CM | POA: Diagnosis present

## 2015-09-14 HISTORY — PX: THORACOTOMY/LOBECTOMY: SHX6116

## 2015-09-14 LAB — CREATININE, SERUM
CREATININE: 0.55 mg/dL (ref 0.44–1.00)
GFR calc Af Amer: >60 mL/min (ref >60)
GFR calc non Af Amer: >60 mL/min (ref >60)

## 2015-09-14 LAB — CBC
HCT: 36.3 % (ref 35.0–47.0)
HEMOGLOBIN: 12.2 g/dL (ref 12.0–16.0)
MCH: 32.1 pg (ref 26.0–34.0)
MCHC: 33.7 g/dL (ref 32.0–36.0)
MCV: 95.4 fL (ref 80.0–100.0)
Platelets: 251 10*3/uL (ref 150–440)
RBC: 3.8 MIL/uL (ref 3.80–5.20)
RDW: 12.7 % (ref 11.5–14.5)
WBC: 18.1 10*3/uL — ABNORMAL HIGH (ref 3.6–11.0)

## 2015-09-14 SURGERY — LOBECTOMY, LUNG, OPEN
Anesthesia: General | Laterality: Right | Wound class: Clean Contaminated

## 2015-09-14 MED ORDER — PROPOFOL 10 MG/ML IV BOLUS
INTRAVENOUS | Status: DC | PRN
  Administered 2015-09-14: 30 mg via INTRAVENOUS
  Administered 2015-09-14: 20 mg via INTRAVENOUS
  Administered 2015-09-14: 200 mg via INTRAVENOUS

## 2015-09-14 MED ORDER — ONDANSETRON HCL 4 MG/2ML IJ SOLN
INTRAMUSCULAR | Status: DC | PRN
  Administered 2015-09-14: 4 mg via INTRAVENOUS

## 2015-09-14 MED ORDER — HYDROMORPHONE HCL 1 MG/ML IJ SOLN
INTRAMUSCULAR | Status: AC
  Filled 2015-09-14: qty 1

## 2015-09-14 MED ORDER — UMECLIDINIUM-VILANTEROL 62.5-25 MCG/INH IN AEPB
1.0000 | INHALATION_SPRAY | Freq: Every day | RESPIRATORY_TRACT | Status: DC
  Administered 2015-09-15 – 2015-09-21 (×7): 1 via RESPIRATORY_TRACT
  Filled 2015-09-14: qty 14

## 2015-09-14 MED ORDER — CEFAZOLIN SODIUM-DEXTROSE 2-4 GM/100ML-% IV SOLN
2.0000 g | Freq: Three times a day (TID) | INTRAVENOUS | Status: AC
Start: 2015-09-14 — End: 2015-09-14
  Administered 2015-09-14 (×2): 2 g via INTRAVENOUS
  Filled 2015-09-14 (×2): qty 100

## 2015-09-14 MED ORDER — FENTANYL CITRATE (PF) 100 MCG/2ML IJ SOLN
25.0000 ug | INTRAMUSCULAR | Status: DC | PRN
  Administered 2015-09-14 (×4): 25 ug via INTRAVENOUS

## 2015-09-14 MED ORDER — ACETAMINOPHEN 10 MG/ML IV SOLN
INTRAVENOUS | Status: DC | PRN
  Administered 2015-09-14: 1000 mg via INTRAVENOUS

## 2015-09-14 MED ORDER — CEFAZOLIN SODIUM-DEXTROSE 2-4 GM/100ML-% IV SOLN
INTRAVENOUS | Status: AC
  Filled 2015-09-14: qty 100

## 2015-09-14 MED ORDER — FENTANYL CITRATE (PF) 100 MCG/2ML IJ SOLN
INTRAMUSCULAR | Status: DC | PRN
  Administered 2015-09-14: 100 ug via INTRAVENOUS
  Administered 2015-09-14 (×2): 50 ug via INTRAVENOUS

## 2015-09-14 MED ORDER — FENTANYL CITRATE (PF) 100 MCG/2ML IJ SOLN
INTRAMUSCULAR | Status: AC
  Administered 2015-09-14: 25 ug via INTRAVENOUS
  Filled 2015-09-14: qty 2

## 2015-09-14 MED ORDER — ROCURONIUM BROMIDE 100 MG/10ML IV SOLN
INTRAVENOUS | Status: DC | PRN
  Administered 2015-09-14: 20 mg via INTRAVENOUS
  Administered 2015-09-14: 25 mg via INTRAVENOUS
  Administered 2015-09-14: 5 mg via INTRAVENOUS
  Administered 2015-09-14: 10 mg via INTRAVENOUS
  Administered 2015-09-14: 30 mg via INTRAVENOUS

## 2015-09-14 MED ORDER — ENOXAPARIN SODIUM 40 MG/0.4ML ~~LOC~~ SOLN: 40.0000 mg | SUBCUTANEOUS | Status: DC

## 2015-09-14 MED ORDER — KETOROLAC TROMETHAMINE 30 MG/ML IJ SOLN
INTRAMUSCULAR | Status: DC | PRN
  Administered 2015-09-14: 15 mg via INTRAVENOUS

## 2015-09-14 MED ORDER — FAMOTIDINE 20 MG PO TABS
20.0000 mg | ORAL_TABLET | Freq: Once | ORAL | Status: AC
  Administered 2015-09-14: 20 mg via ORAL

## 2015-09-14 MED ORDER — SUCCINYLCHOLINE CHLORIDE 20 MG/ML IJ SOLN
INTRAMUSCULAR | Status: DC | PRN
  Administered 2015-09-14: 140 mg via INTRAVENOUS

## 2015-09-14 MED ORDER — PROMETHAZINE HCL 25 MG/ML IJ SOLN
12.5000 mg | INTRAMUSCULAR | Status: DC | PRN
Start: 2015-09-14 — End: 2015-09-22
  Administered 2015-09-14: 12.5 mg via INTRAVENOUS
  Filled 2015-09-14: qty 1

## 2015-09-14 MED ORDER — CHLORHEXIDINE GLUCONATE 4 % EX LIQD: 1.0000 "application " | Freq: Once | CUTANEOUS | Status: DC

## 2015-09-14 MED ORDER — HYDROMORPHONE HCL 1 MG/ML IJ SOLN
INTRAMUSCULAR | Status: DC | PRN
  Administered 2015-09-14: 1 mg via INTRAVENOUS

## 2015-09-14 MED ORDER — ONDANSETRON HCL 4 MG/2ML IJ SOLN
4.0000 mg | INTRAMUSCULAR | Status: DC | PRN
  Administered 2015-09-14: 4 mg via INTRAVENOUS
  Filled 2015-09-14: qty 2

## 2015-09-14 MED ORDER — SODIUM CHLORIDE 0.9 % IV SOLN
INTRAVENOUS | Status: DC | PRN
  Administered 2015-09-14: 50 mL

## 2015-09-14 MED ORDER — BUPIVACAINE LIPOSOME 1.3 % IJ SUSP
INTRAMUSCULAR | Status: AC
  Filled 2015-09-14: qty 20

## 2015-09-14 MED ORDER — ENOXAPARIN SODIUM 40 MG/0.4ML ~~LOC~~ SOLN
40.0000 mg | SUBCUTANEOUS | Status: DC
  Administered 2015-09-15 – 2015-09-21 (×7): 40 mg via SUBCUTANEOUS
  Filled 2015-09-14 (×7): qty 0.4

## 2015-09-14 MED ORDER — MORPHINE SULFATE (PF) 2 MG/ML IV SOLN
2.0000 mg | INTRAVENOUS | Status: DC | PRN
  Administered 2015-09-14 – 2015-09-15 (×4): 2 mg via INTRAVENOUS
  Filled 2015-09-14 (×5): qty 1

## 2015-09-14 MED ORDER — ONDANSETRON 8 MG PO TBDP: 4.0000 mg | ORAL_TABLET | ORAL | Status: DC | PRN

## 2015-09-14 MED ORDER — LIDOCAINE HCL (CARDIAC) 20 MG/ML IV SOLN
INTRAVENOUS | Status: DC | PRN
  Administered 2015-09-14: 30 mg via INTRAVENOUS

## 2015-09-14 MED ORDER — METOPROLOL SUCCINATE ER 25 MG PO TB24
25.0000 mg | ORAL_TABLET | Freq: Every day | ORAL | Status: DC
  Administered 2015-09-15 – 2015-09-21 (×7): 25 mg via ORAL
  Filled 2015-09-14 (×7): qty 1

## 2015-09-14 MED ORDER — FAMOTIDINE 20 MG PO TABS
ORAL_TABLET | ORAL | Status: AC
  Filled 2015-09-14: qty 1

## 2015-09-14 MED ORDER — LORATADINE 10 MG PO TABS
10.0000 mg | ORAL_TABLET | Freq: Every day | ORAL | Status: DC
  Administered 2015-09-15 – 2015-09-21 (×7): 10 mg via ORAL
  Filled 2015-09-14 (×7): qty 1

## 2015-09-14 MED ORDER — SODIUM CHLORIDE 0.9 % IJ SOLN
INTRAMUSCULAR | Status: AC
  Filled 2015-09-14: qty 30

## 2015-09-14 MED ORDER — ACETAMINOPHEN 650 MG RE SUPP: 650.0000 mg | Freq: Four times a day (QID) | RECTAL | Status: DC | PRN

## 2015-09-14 MED ORDER — ACETAMINOPHEN 10 MG/ML IV SOLN
INTRAVENOUS | Status: AC
  Filled 2015-09-14: qty 100

## 2015-09-14 MED ORDER — DEXTROSE-NACL 5-0.45 % IV SOLN
INTRAVENOUS | Status: DC
  Administered 2015-09-14 – 2015-09-15 (×2): via INTRAVENOUS

## 2015-09-14 MED ORDER — OXYCODONE HCL 5 MG PO TABS
5.0000 mg | ORAL_TABLET | ORAL | Status: DC | PRN
  Administered 2015-09-15 (×2): 10 mg via ORAL
  Administered 2015-09-15 (×2): 5 mg via ORAL
  Administered 2015-09-16 (×5): 10 mg via ORAL
  Administered 2015-09-17: 5 mg via ORAL
  Administered 2015-09-17 – 2015-09-20 (×10): 10 mg via ORAL
  Administered 2015-09-20: 5 mg via ORAL
  Administered 2015-09-20 – 2015-09-22 (×7): 10 mg via ORAL
  Filled 2015-09-14 (×2): qty 2
  Filled 2015-09-14: qty 1
  Filled 2015-09-14: qty 2
  Filled 2015-09-14: qty 1
  Filled 2015-09-14 (×14): qty 2
  Filled 2015-09-14: qty 1
  Filled 2015-09-14 (×7): qty 2
  Filled 2015-09-14: qty 1

## 2015-09-14 MED ORDER — PHENYLEPHRINE HCL 10 MG/ML IJ SOLN
INTRAMUSCULAR | Status: DC | PRN
  Administered 2015-09-14 (×5): 100 ug via INTRAVENOUS

## 2015-09-14 MED ORDER — ACETAMINOPHEN 325 MG PO TABS: 650.0000 mg | ORAL_TABLET | Freq: Four times a day (QID) | ORAL | Status: DC | PRN

## 2015-09-14 MED ORDER — LOSARTAN POTASSIUM 50 MG PO TABS
100.0000 mg | ORAL_TABLET | Freq: Every day | ORAL | Status: DC
  Administered 2015-09-15 – 2015-09-21 (×7): 100 mg via ORAL
  Filled 2015-09-14 (×7): qty 2

## 2015-09-14 MED ORDER — ONDANSETRON HCL 4 MG/2ML IJ SOLN: 4.0000 mg | Freq: Once | INTRAMUSCULAR | Status: DC | PRN

## 2015-09-14 MED ORDER — AMLODIPINE BESYLATE 10 MG PO TABS
10.0000 mg | ORAL_TABLET | Freq: Every day | ORAL | Status: DC
  Administered 2015-09-15 – 2015-09-21 (×7): 10 mg via ORAL
  Filled 2015-09-14 (×7): qty 1

## 2015-09-14 MED ORDER — MIDAZOLAM HCL 2 MG/2ML IJ SOLN
INTRAMUSCULAR | Status: DC | PRN
  Administered 2015-09-14: 2 mg via INTRAVENOUS

## 2015-09-14 MED ORDER — ZOLPIDEM TARTRATE 5 MG PO TABS: 5.0000 mg | ORAL_TABLET | Freq: Every evening | ORAL | Status: DC | PRN

## 2015-09-14 MED ORDER — LACTATED RINGERS IV SOLN
INTRAVENOUS | Status: DC
  Administered 2015-09-14: 09:00:00 via INTRAVENOUS

## 2015-09-14 MED ORDER — CEFAZOLIN SODIUM-DEXTROSE 2-4 GM/100ML-% IV SOLN
2.0000 g | INTRAVENOUS | Status: AC
  Administered 2015-09-14: 2 g via INTRAVENOUS

## 2015-09-14 SURGICAL SUPPLY — 85 items
APPLIER CLIP 9.375 SM OPEN (CLIP) ×3
APPLIER CLIP LOGIC TI 5 (MISCELLANEOUS) ×3 IMPLANT
BENZOIN TINCTURE PRP APPL 2/3 (GAUZE/BANDAGES/DRESSINGS) IMPLANT
BNDG COHESIVE 4X5 TAN STRL (GAUZE/BANDAGES/DRESSINGS) IMPLANT
BRONCHOSCOPE PED SLIM DISP (MISCELLANEOUS) ×3 IMPLANT
BULB RESERV EVAC DRAIN JP 100C (MISCELLANEOUS) ×3 IMPLANT
CANISTER SUCT 1200ML W/VALVE (MISCELLANEOUS) ×3 IMPLANT
CATH THOR STR 32F 8032 SOFT WA (CATHETERS) ×3 IMPLANT
CATH THORACIC RT ANG 28FR SOFT (CATHETERS) ×3 IMPLANT
CATH TRAY 16F METER LATEX (MISCELLANEOUS) ×3 IMPLANT
CATH URET ROBINSON 16FR STRL (CATHETERS) ×3 IMPLANT
CHLORAPREP W/TINT 26ML (MISCELLANEOUS) ×6 IMPLANT
CLIP APPLIE 9.375 SM OPEN (CLIP) ×1 IMPLANT
CLOSURE WOUND 1/2 X4 (GAUZE/BANDAGES/DRESSINGS) ×1
CNTNR SPEC 2.5X3XGRAD LEK (MISCELLANEOUS)
CONN REDUCER 1/4X3/8 STR (CONNECTOR) ×3
CONN REDUCER 3/8X3/8X3/8Y (CONNECTOR) ×3
CONNECTOR REDUCER 1/4X3/8 STR (CONNECTOR) ×1 IMPLANT
CONNECTOR REDUCER 3/8X3/8X3/8Y (CONNECTOR) ×1 IMPLANT
CONT SPEC 4OZ STER OR WHT (MISCELLANEOUS)
CONTAINER SPEC 2.5X3XGRAD LEK (MISCELLANEOUS) IMPLANT
COVER LIGHT HANDLE STERIS (MISCELLANEOUS) ×3 IMPLANT
CUTTER ECHEON FLEX ENDO 45 340 (ENDOMECHANICALS) ×3 IMPLANT
DEFOGGER SCOPE WARMER CLEARIFY (MISCELLANEOUS) ×3 IMPLANT
DRAIN CHEST DRY SUCT SGL (MISCELLANEOUS) ×6 IMPLANT
DRAPE C-SECTION (MISCELLANEOUS) ×3 IMPLANT
DRAPE MAG INST 16X20 L/F (DRAPES) ×3 IMPLANT
DRSG OPSITE POSTOP 4X10 (GAUZE/BANDAGES/DRESSINGS) ×3 IMPLANT
DRSG OPSITE POSTOP 4X6 (GAUZE/BANDAGES/DRESSINGS) IMPLANT
DRSG OPSITE POSTOP 4X8 (GAUZE/BANDAGES/DRESSINGS) IMPLANT
DRSG TEGADERM 2-3/8X2-3/4 SM (GAUZE/BANDAGES/DRESSINGS) ×3 IMPLANT
DRSG TELFA 3X8 NADH (GAUZE/BANDAGES/DRESSINGS) IMPLANT
ELECT BLADE 6.5 EXT (BLADE) ×6 IMPLANT
ELECT CAUTERY BLADE TIP 2.5 (TIP) ×3
ELECT REM PT RETURN 9FT ADLT (ELECTROSURGICAL) ×3
ELECTRODE CAUTERY BLDE TIP 2.5 (TIP) ×1 IMPLANT
ELECTRODE REM PT RTRN 9FT ADLT (ELECTROSURGICAL) ×1 IMPLANT
GAUZE SPONGE 4X4 12PLY STRL (GAUZE/BANDAGES/DRESSINGS) ×3 IMPLANT
GLOVE BIO SURGEON STRL SZ7 (GLOVE) ×15 IMPLANT
GLOVE SURG SYN 7.5  E (GLOVE) ×2
GLOVE SURG SYN 7.5 E (GLOVE) ×1 IMPLANT
GOWN STRL REUS W/ TWL LRG LVL3 (GOWN DISPOSABLE) ×4 IMPLANT
GOWN STRL REUS W/TWL LRG LVL3 (GOWN DISPOSABLE) ×8
KIT RM TURNOVER STRD PROC AR (KITS) ×3 IMPLANT
LABEL OR SOLS (LABEL) ×3 IMPLANT
LOOP RED MAXI  1X406MM (MISCELLANEOUS) ×2
LOOP VESSEL MAXI 1X406 RED (MISCELLANEOUS) ×1 IMPLANT
MARKER SKIN DUAL TIP RULER LAB (MISCELLANEOUS) ×3 IMPLANT
NEEDLE HYPO 22GX1.5 SAFETY (NEEDLE) ×3 IMPLANT
NEEDLE SPNL 20GX3.5 QUINCKE YW (NEEDLE) ×3 IMPLANT
PACK BASIN MAJOR ARMC (MISCELLANEOUS) ×3 IMPLANT
PENCIL ELECTRO HAND CTR (MISCELLANEOUS) ×3 IMPLANT
RELOAD GOLD ECHELON 45 (STAPLE) ×6 IMPLANT
RELOAD GREEN ECHELON 45 (STAPLE) ×3 IMPLANT
RELOAD STAPLER LINE PROX 30 GR (STAPLE) IMPLANT
SEALANT PROGEL (MISCELLANEOUS) ×3 IMPLANT
SPONGE KITTNER 5P (MISCELLANEOUS) ×9 IMPLANT
SPONGE XRAY 4X4 16PLY STRL (MISCELLANEOUS) ×3 IMPLANT
STAPLE RELOAD 2.5MM WHITE (STAPLE) ×15 IMPLANT
STAPLER RELOAD LINE PROX 30 GR (STAPLE)
STAPLER SKIN PROX 35W (STAPLE) ×3 IMPLANT
STAPLER VASCULAR ECHELON 35 (CUTTER) ×3 IMPLANT
STRIP CLOSURE SKIN 1/2X4 (GAUZE/BANDAGES/DRESSINGS) ×2 IMPLANT
SUT ETHILON 3-0 FS-10 30 BLK (SUTURE) ×3
SUT MNCRL AB 3-0 PS2 27 (SUTURE) ×6 IMPLANT
SUT PROLENE 5 0 RB 1 DA (SUTURE) ×3 IMPLANT
SUT SILK 0 (SUTURE)
SUT SILK 0 30XBRD TIE 6 (SUTURE) IMPLANT
SUT SILK 1 SH (SUTURE) ×27 IMPLANT
SUT VIC AB 0 CT1 36 (SUTURE) ×6 IMPLANT
SUT VIC AB 2-0 CT1 27 (SUTURE) ×4
SUT VIC AB 2-0 CT1 TAPERPNT 27 (SUTURE) ×2 IMPLANT
SUT VICRYL 2 TP 1 (SUTURE) ×12 IMPLANT
SUTURE EHLN 3-0 FS-10 30 BLK (SUTURE) ×1 IMPLANT
SYR 10ML SLIP (SYRINGE) IMPLANT
SYR 30ML LL (SYRINGE) ×3 IMPLANT
SYR BULB IRRIG 60ML STRL (SYRINGE) ×3 IMPLANT
TAPE ADH 3 LX (MISCELLANEOUS) ×3 IMPLANT
TAPE TRANSPORE STRL 2 31045 (GAUZE/BANDAGES/DRESSINGS) IMPLANT
TROCAR FLEXIPATH 20X80 (ENDOMECHANICALS) IMPLANT
TROCAR FLEXIPATH THORACIC 15MM (ENDOMECHANICALS) ×3 IMPLANT
TUBING CONNECTING 10 (TUBING) ×4 IMPLANT
TUBING CONNECTING 10' (TUBING) ×2
WATER STERILE IRR 1000ML POUR (IV SOLUTION) ×12 IMPLANT
YANKAUER SUCT BULB TIP FLEX NO (MISCELLANEOUS) ×9 IMPLANT

## 2015-09-14 NOTE — Transfer of Care (Signed)
Immediate Anesthesia Transfer of Care Note  Patient: Connie Osborne  Procedure(s) Performed: Procedure(s): THORACOTOMY/LOBECTOMY (Right)  Patient Location: PACU  Anesthesia Type:General  Level of Consciousness: sedated  Airway & Oxygen Therapy: Patient Spontanous Breathing and Patient connected to face mask oxygen  Post-op Assessment: Report given to RN and Post -op Vital signs reviewed and stable  Post vital signs: Reviewed and stable  Last Vitals:  Filed Vitals:   09/14/15 0822 09/14/15 1458  BP: 135/89 116/71  Pulse: 62 70  Temp: 36.6 C 36.4 C  Resp: 18 19    Last Pain: There were no vitals filed for this visit.       Complications: No apparent anesthesia complications

## 2015-09-14 NOTE — Op Note (Signed)
Preop diagnosis: Carcinoma right lung upper lobe  Post op diagnosis: Same  Operation: Right thoracotomy and right upper lobe lobectomy  Surgeon: Mckinley Jewel  Assistant: Marta Lamas   Anesthesia: Gen.  Complications: None  EBL: Proximally 6050-75 mL  Drains: An angled and a straight chest tube and a Jackson-Pratt drain  Description: This patient had been diagnosed with a small apical lung cancer  - pathology showing an adenocarcinoma. Patient was prepared for local right lung upper lobectomy. She was brought to the operating room and put to sleep with a double-lumen endotracheal tube. Bronchoscopy was performed via the area of endotracheal tube tip to ensure proper positioning of the end of the tube going into the left mainstem bronchus. Following this the patient was placed in the left lateral position and held in place place with a beanbag with attention directed to her axilla and all other pressure points. The right chest was then prepped and draped sterile field and incision was then mapped out along the inferior portion of the posterior lateral chest below the level of the angle of the scapula. Timeout was performed. Skin incision was made from just below the angle of the scapula anteriorly and the incision was deepened through to the layer of the fascia covering the muscles. Bleeding was controlled cautery and thereafter the skin and subcutaneous tissue down to the fascia was then elevated on both sides to allow for adequate mobilization of the muscles. The latissimus dorsi and subsequently the serratus anterior were both freed and retracted to expose the intercostal spaces. On the superior border of the rib the chest cavity was entered into and the right lung was deflated and the incision along the rib was extended both anterior and posterior until retractors were placed in the ribs and the muscles. The lungs to appear to be resulting normal in appearance and there was nothing that suggested  any metastatic disease on inspection. Since the lesion was located in the posterior aspect of the upper lobe attention was directed to the upper lobe which was then dissected all around the hilar region so as to expose the the upper lobe vein, the upper lobe artery and the bronchus all separately. In the course of dissection few lymph nodes in the interlobar region were identified these were separately removed and sent. Also noted were some lymph nodes around the hilar region along the upper lobe connection and these were also separately sampled and sent. The the vasculature was then taken down with the use of the echelon stapling device using the vascular loads. The fissure between the upper middle lobes was taken down with a gold load of the echelon stapler. After the bronchus was satisfactorily freed it was then taken down with the use of the green load using the echelon stapler. The upper lobe was then removed out of the chest cavity and noted that there was a very small lesion located in the posterior aspect as noted on the CT. There was also noted to be a pretracheal node which was identified and exposed in the mediastinal region and several bites of this node were obtained and sent separately to pathology. Large volume of fluid was then used to look for an air leak with the reexpansion along the none was noted bronchus stump along the staple line. To minimize twisting of the middle lobe which appeared to be fairly free the edges of the middle and lower lobe were approximated with a stapler. 2 chest tubes were then used 1  angled tube going down over the diaphragm posteriorly and a straight tube placed for anteriorly goes towards the apex the chest tubes were positioned tunneled underneath the skin and subcutaneous tissue to the intercostal space at a higher level. Chest tubes were then anchored to the skin with the 0 silk stitches and a pursestring suture was loosely tied Armour tube for subsequent closure  after removal of the chest tubes. The ribs were then approximated with interrupted #2 Vicryl stitches. The right lung was allowed to reexpand at this time and chest tube was connected to's to Pleur-evac. The subcutaneous space was then drained with a Blake drain which was brought out through a separate stab incision and fastened to the skin also with a 0 silk. The subcutaneous tissue were reapproximated with a running 2-0 Vicryl stitch. And the skin and the remaining small port site in the posterior aspect of closed with staples. A honeycomb dressing was placed over the main incision with the 4 x 4's and tapes placed on the others. Patient subsequently was extubated and returned recovery room in stable condition

## 2015-09-14 NOTE — H&P (View-Only) (Signed)
Patient ID: Connie Osborne, female   DOB: 1960-07-23, 55 y.o.   MRN: [id number]  Chief Complaint  Patient presents with  . Pre-op Exam    Thoracotomy    HPI Connie Osborne is a 55 y.o. female here today for her pre op right thoracotomy scheduled for 09/14/15.  Patient states she feels like she is started to get a sinus infection.  I have reviewed the history of present illness with the patient.  HPI  Past Medical History  Diagnosis Date  . Hypertension   . Allergy   . GERD (gastroesophageal reflux disease)   . Vitamin D deficiency   . Shortness of breath dyspnea   . Coughing up blood   . Arthritis   . COPD (chronic obstructive pulmonary disease) ([place])   . Breast cancer [facility]) 2014    Left- Radiation    Past Surgical History  Procedure Laterality Date  . Abdominal hysterectomy    . Foot surgery    . Ganglion cyst excision    . Fracture surgery    . Breast surgery Left 2014    lumpectomy  . Electromagnetic navigation brochoscopy Right 07/28/2015    Procedure: ELECTROMAGNETIC NAVIGATION BRONCHOSCOPY;  Surgeon: Flora Lipps, MD;  Location: [facility] ORS;  Service: Cardiopulmonary;  Laterality: Right;  . Colonoscopy with propofol N/A 08/18/2015    Procedure: COLONOSCOPY WITH PROPOFOL;  Surgeon: Christene Lye, MD;  Location: [facility] ENDOSCOPY;  Service: Endoscopy;  Laterality: N/A;  . Breast biopsy Left 2014    +  . Breast excisional biopsy Left 2014    Family History  Problem Relation Age of Onset  . Cancer Sister 67    breast  . Breast cancer Sister 13    30  . Cancer Sister 22    breast  . Cancer Other     breast  . Lung cancer Brother   . Hypertension Mother   . Diabetes Mellitus II Mother   . Cancer Mother     mets  . Cancer Father     long cancer    Social History Social History  Substance Use Topics  . Smoking status: Former Smoker -- 1.00 packs/day for 30 years    Types: Cigarettes    Quit date: 07/30/2015  . Smokeless tobacco: Never Used  . Alcohol  Use: No    Allergies  Allergen Reactions  . Accupril [Quinapril Hcl] Cough  . Percocet [Oxycodone-Acetaminophen] Other (See Comments)    Reaction: Unknown    Current Outpatient Prescriptions  Medication Sig Dispense Refill  . amLODipine (NORVASC) 10 MG tablet Take 10 mg by mouth daily with lunch.     . cetirizine (ZYRTEC) 10 MG tablet Take 10 mg by mouth as needed for allergies.    Marland Kitchen losartan (COZAAR) 100 MG tablet Take 100 mg by mouth daily with lunch.     . metoprolol succinate (TOPROL-XL) 25 MG 24 hr tablet Take 25 mg by mouth daily with lunch.     . umeclidinium-vilanterol (ANORO ELLIPTA) 62.5-25 MCG/INH AEPB Inhale 1 puff into the lungs daily. (Patient taking differently: Inhale 1 puff into the lungs daily with lunch. ) 60 each 5   No current facility-administered medications for this visit.    Review of Systems Review of Systems  Constitutional: Negative.   Respiratory: Positive for cough. Negative for apnea, choking, chest tightness, shortness of breath, wheezing and stridor.   Cardiovascular: Negative.   Gastrointestinal: Negative.     Blood pressure 124/72, pulse 76, resp. rate 14,  height 5' (1.524 m), weight 160 lb (72.576 kg).  Physical Exam Physical Exam  Constitutional: She is oriented to person, place, and time. She appears well-developed and well-nourished.  Eyes: Conjunctivae are normal. No scleral icterus.  Neck: Neck supple.  Cardiovascular: Normal rate, regular rhythm and normal heart sounds.   Pulmonary/Chest: Effort normal and breath sounds normal.  Abdominal: Soft. Normal appearance and bowel sounds are normal. There is no hepatomegaly. There is no tenderness. No hernia.  Lymphadenopathy:    She has no cervical adenopathy.  Neurological: She is alert and oriented to person, place, and time.  Skin: Skin is warm and dry.    Data Reviewed  .    Assessment    Right upper lobe adenocarcinoma  History of breast cancer.   Plan    Patient's  thoracotomy surgery is scheduled for 09/14/15 at [facility]. Procedure and risks discussed fully again. She is asked to call if her cold symptoms worsen in next couple of days        PCP:  [facility]  This information has been scribed by Gaspar Cola CMA.    SANKAR,SEEPLAPUTHUR G 09/07/2015, 10:18 AM

## 2015-09-14 NOTE — Progress Notes (Signed)
Md notified of pt complaints of dizziness and nausea and vomiting. PRN zofran d/c'd and PRN phenergan 12.5 ordered. Will continue to monitor.

## 2015-09-14 NOTE — Anesthesia Preprocedure Evaluation (Addendum)
Anesthesia Evaluation  Patient identified by MRN, date of birth, ID band Patient awake    Reviewed: Allergy & Precautions, NPO status , Unable to perform ROS - Chart review only  History of Anesthesia Complications Negative for: history of anesthetic complications  Airway Mallampati: II       Dental   Pulmonary neg pulmonary ROS, shortness of breath, COPD,  COPD inhaler, former smoker,           Cardiovascular hypertension, Pt. on medications      Neuro/Psych    GI/Hepatic Neg liver ROS, GERD  ,  Endo/Other  negative endocrine ROS  Renal/GU negative Renal ROS     Musculoskeletal   Abdominal   Peds  Hematology negative hematology ROS (+)   Anesthesia Other Findings   Reproductive/Obstetrics                            Anesthesia Physical Anesthesia Plan  ASA: III  Anesthesia Plan: General   Post-op Pain Management:    Induction: Intravenous  Airway Management Planned: Oral ETT and Double Lumen EBT  Additional Equipment:   Intra-op Plan:   Post-operative Plan:   Informed Consent: I have reviewed the patients History and Physical, chart, labs and discussed the procedure including the risks, benefits and alternatives for the proposed anesthesia with the patient or authorized representative who has indicated his/her understanding and acceptance.     Plan Discussed with:   Anesthesia Plan Comments:         Anesthesia Quick Evaluation

## 2015-09-14 NOTE — Anesthesia Procedure Notes (Signed)
Procedure Name: Intubation Date/Time: 09/14/2015 10:45 AM Performed by: Johnna Acosta Pre-anesthesia Checklist: Patient identified, Emergency Drugs available, Suction available, Patient being monitored and Timeout performed Oxygen Delivery Method: Circle system utilized Preoxygenation: Pre-oxygenation with 100% oxygen Intubation Type: IV induction Ventilation: Mask ventilation without difficulty Laryngoscope Size: Mac and 3 Grade View: Grade I Tube type: Oral Endobronchial tube: Double lumen EBT, EBT position confirmed by auscultation and EBT position confirmed by fiberoptic bronchoscope and 37 Fr Number of attempts: 1 Airway Equipment and Method: Stylet Placement Confirmation: ETT inserted through vocal cords under direct vision,  positive ETCO2 and breath sounds checked- equal and bilateral Secured at: 29 cm Tube secured with: Tape Dental Injury: Teeth and Oropharynx as per pre-operative assessment

## 2015-09-14 NOTE — Interval H&P Note (Signed)
History and Physical Interval Note:  09/14/2015 10:04 AM  Connie Osborne  has presented today for surgery, with the diagnosis of CANCER RIGHT LUNG  The various methods of treatment have been discussed with the patient and family. After consideration of risks, benefits and other options for treatment, the patient has consented to  Procedure(s): THORACOTOMY/LOBECTOMY (Right) as a surgical intervention .  The patient's history has been reviewed, patient examined, no change in status, stable for surgery.  I have reviewed the patient's chart and labs.  Questions were answered to the patient's satisfaction.     Jaecion Dempster G

## 2015-09-15 ENCOUNTER — Inpatient Hospital Stay: Payer: BLUE CROSS/BLUE SHIELD

## 2015-09-15 ENCOUNTER — Encounter: Payer: Self-pay | Admitting: General Surgery

## 2015-09-15 LAB — BASIC METABOLIC PANEL
ANION GAP: 4 — AB (ref 5–15)
BUN: 9 mg/dL (ref 6–20)
CALCIUM: 8.7 mg/dL — AB (ref 8.9–10.3)
CO2: 26 mmol/L (ref 22–32)
Chloride: 108 mmol/L (ref 101–111)
Creatinine, Ser: 0.5 mg/dL (ref 0.44–1.00)
GFR calc Af Amer: >60 mL/min (ref >60)
GLUCOSE: 133 mg/dL — AB (ref 65–99)
Potassium: 3.3 mmol/L — ABNORMAL LOW (ref 3.5–5.1)
SODIUM: 138 mmol/L (ref 135–145)

## 2015-09-15 LAB — TYPE AND SCREEN
ABO/RH(D): O POS
Antibody Screen: NEGATIVE
Unit division: 0
Unit division: 0

## 2015-09-15 LAB — CBC
HCT: 34.3 % — ABNORMAL LOW (ref 35.0–47.0)
HEMOGLOBIN: 11.9 g/dL — AB (ref 12.0–16.0)
MCH: 32.7 pg (ref 26.0–34.0)
MCHC: 34.7 g/dL (ref 32.0–36.0)
MCV: 94.2 fL (ref 80.0–100.0)
Platelets: 260 10*3/uL (ref 150–440)
RBC: 3.64 MIL/uL — ABNORMAL LOW (ref 3.80–5.20)
RDW: 12.6 % (ref 11.5–14.5)
WBC: 13.5 10*3/uL — AB (ref 3.6–11.0)

## 2015-09-15 LAB — PREPARE RBC (CROSSMATCH)

## 2015-09-15 MED ORDER — KCL IN DEXTROSE-NACL 20-5-0.45 MEQ/L-%-% IV SOLN
INTRAVENOUS | Status: DC
  Administered 2015-09-16: via INTRAVENOUS
  Filled 2015-09-15 (×3): qty 1000

## 2015-09-15 NOTE — Progress Notes (Signed)
Patient ID: Connie Osborne, female   DOB: December 22, 1960, 55 y.o.   MRN: [id number] Pt was nauseated last pm, feels better today. Moderate pain although she seems very comfortable now. AVSS. Lungs- good air entry and effort, clear. Chest dressings and tibes are intact. Chest tube drained 265m,serosanguinous. Moderate air leak. Drain- 462m. Good u/o. Labs ok, except for K of 3.3. CXR- tiny apical pneumo, otherwise clear. Overall stable.  Advised to ambulate today. Discussed with nursing also. Foley to stay in today. Correct K

## 2015-09-15 NOTE — Anesthesia Postprocedure Evaluation (Signed)
Anesthesia Post Note  Patient: Connie Osborne  Procedure(s) Performed: Procedure(s) (LRB): THORACOTOMY/LOBECTOMY (Right)  Patient location during evaluation: PACU Anesthesia Type: General Level of consciousness: awake and alert Pain management: pain level controlled Vital Signs Assessment: post-procedure vital signs reviewed and stable Respiratory status: spontaneous breathing and respiratory function stable Cardiovascular status: stable Anesthetic complications: no    Last Vitals:  Filed Vitals:   09/15/15 0054 09/15/15 0555  BP: 137/69 137/68  Pulse: 70 65  Temp: 36.6 C 36.7 C  Resp: 17 18    Last Pain:  Filed Vitals:   09/15/15 0755  PainSc: 8                  Elzia Hott K

## 2015-09-16 ENCOUNTER — Ambulatory Visit: Payer: BLUE CROSS/BLUE SHIELD | Admitting: Internal Medicine

## 2015-09-16 ENCOUNTER — Inpatient Hospital Stay: Payer: BLUE CROSS/BLUE SHIELD

## 2015-09-16 LAB — BASIC METABOLIC PANEL
ANION GAP: 8 (ref 5–15)
BUN: 9 mg/dL (ref 6–20)
CALCIUM: 8.6 mg/dL — AB (ref 8.9–10.3)
CO2: 26 mmol/L (ref 22–32)
Chloride: 102 mmol/L (ref 101–111)
Creatinine, Ser: 0.46 mg/dL (ref 0.44–1.00)
GLUCOSE: 151 mg/dL — AB (ref 65–99)
POTASSIUM: 3.2 mmol/L — AB (ref 3.5–5.1)
SODIUM: 136 mmol/L (ref 135–145)

## 2015-09-16 MED ORDER — POTASSIUM CHLORIDE CRYS ER 20 MEQ PO TBCR
20.0000 meq | EXTENDED_RELEASE_TABLET | Freq: Every day | ORAL | Status: DC
  Administered 2015-09-16 – 2015-09-21 (×6): 20 meq via ORAL
  Filled 2015-09-16 (×6): qty 1

## 2015-09-16 NOTE — Progress Notes (Signed)
Foley d/c'd per Dr Genevive Bi at 11:42 AM.  Tolerated well.

## 2015-09-16 NOTE — Progress Notes (Signed)
Patient ID: Connie Osborne, female   DOB: 04/14/1960, 55 y.o.   MRN: [id number]  Ms. Connie Osborne is 2 days post-thoracotomy, right upper lobectomy. Patient is doing well, complains of occasional pain. Reports walking yesterday, today. Family members bedside.  AVSS CXR revealed lower left atelectasis, stable from yesterday. Potassium remained low (3.2), despite K+ IVFs, discontinued IV, began '20mg'$  K+ tabs QD. Chest tubes drained 174m serosanguinous fluid today, slight air leak remaining, although that appears to be less than yesterday. Jackson-Pratt drain continuing to drain >427mserosanguinous fluid daily - will remain. Incision site appears healthy, healing well. No erythema, exudate. Overall stable. Pressure on pleurevac changed to 10cm this morning. Will stop IV, correct K+ with oral tabs. Patient encouraged to continue incentive spirometry and to ambulate.

## 2015-09-17 ENCOUNTER — Inpatient Hospital Stay: Payer: BLUE CROSS/BLUE SHIELD

## 2015-09-17 NOTE — Progress Notes (Signed)
Patient ID: JANARA KLETT, female   DOB: 07-Dec-1960, 55 y.o.   MRN: [id number]  Ms. DONIKA BUTNER is day 3 status post thoracotomy and right upper lobectomy. Patient is AAOx3 and doing well. She still complains of some back/shoulder pain. States she is still using her incentive spirometer and ambulating occasionally.   AVSS CXR revealed consistent low lung volumes and bibasilar atelectasis. Small right sided pneumothorax can not be excluded. Overall stable. Potassium 3.2 yesterday, placed on K+ tabs. Will recheck K+ tomorrow Chest tube drainage was 55m over the last 24 hours. Lower chest tube to be removed today. Blake drain with 532mover the last 24 hours, serosanguinous fluid.

## 2015-09-18 ENCOUNTER — Inpatient Hospital Stay: Payer: BLUE CROSS/BLUE SHIELD

## 2015-09-18 LAB — POTASSIUM: POTASSIUM: 3.4 mmol/L — AB (ref 3.5–5.1)

## 2015-09-18 MED ORDER — MAGNESIUM HYDROXIDE 400 MG/5ML PO SUSP
30.0000 mL | Freq: Every day | ORAL | Status: DC | PRN
  Administered 2015-09-18: 30 mL via ORAL
  Filled 2015-09-18 (×2): qty 30

## 2015-09-18 MED ORDER — BISACODYL 10 MG RE SUPP
10.0000 mg | Freq: Once | RECTAL | Status: AC
  Administered 2015-09-18: 10 mg via RECTAL
  Filled 2015-09-18: qty 1

## 2015-09-18 NOTE — Progress Notes (Signed)
Dr. [name] instructed to leave suction to 20 cm even if the order is 10 cm suction.

## 2015-09-18 NOTE — Progress Notes (Signed)
Patient ID: Connie Osborne, female   DOB: Oct 04, 1960, 55 y.o.   MRN: [id number]  Ms. Connie Osborne is 4 days post right thoracotomy and upper lobectomy. Patient has no current complaints other than constipation. She is walking and using her incentive spirometer.  Blake drain was removed without difficulty since drainage was less than 43m since yesterday. Chest x-ray is stable. Small air leak persists. No other new issues.  Potassium level to 3.4 today AVSS Continue remaining chest tube over the weekend. Discussed with patient  Dulcolax/milk of magnesia PRN for constipation.

## 2015-09-19 MED ORDER — ALBUTEROL SULFATE (2.5 MG/3ML) 0.083% IN NEBU
2.5000 mg | INHALATION_SOLUTION | RESPIRATORY_TRACT | Status: DC
  Administered 2015-09-19 – 2015-09-20 (×4): 2.5 mg via RESPIRATORY_TRACT
  Filled 2015-09-19 (×4): qty 3

## 2015-09-19 MED ORDER — FLEET ENEMA 7-19 GM/118ML RE ENEM
1.0000 | ENEMA | Freq: Every day | RECTAL | Status: DC | PRN
  Administered 2015-09-19: 1 via RECTAL
  Filled 2015-09-19: qty 1

## 2015-09-19 NOTE — Progress Notes (Signed)
AVSS. No complaints. No BM since surgery. No results w/ po MOM or Dulcolax supp. Pain well controlled. Air leak with each breath. CXR yesterday w/o visible pneumothorax. Lungs: Shallow inspiration. Few crackles at base. Inspirex: 900 w/ encouragement. Calves: Soft. Used rescue inhaler rarely pre-op. Will use albuterol SVN x 24 hours to see if reduced airway resistance improves air leak. Fleets enema today. Continue ambulation.

## 2015-09-20 ENCOUNTER — Inpatient Hospital Stay: Payer: BLUE CROSS/BLUE SHIELD

## 2015-09-20 NOTE — Progress Notes (Signed)
CXR shows patient developing a small apical pneumothorax. Will return to suction.  With clear lung fields, will d/c aerosol rx.

## 2015-09-20 NOTE — Progress Notes (Signed)
Received report and picked up patient from Christian Mate., RN. 09/20/15 1400

## 2015-09-20 NOTE — Progress Notes (Signed)
AVSS. Occasional pain w/ deep inspiration. Sat's fine. Inspirex better at 1000. Reports productive sputum after SVN. Lungs: Clear. CT: Constant leak on suction. On water seal, markedly decreased. Tube functioning well. Plan: Place on water seal (if tube is over staple line may be perpetuating leak). Will recheck CXR in 2 hours to confirm no pneumothorax is developing. Patient and RN aware that CT should be reconnected to suction if SOB develops.

## 2015-09-21 ENCOUNTER — Encounter: Payer: Self-pay | Admitting: General Surgery

## 2015-09-21 ENCOUNTER — Inpatient Hospital Stay: Payer: BLUE CROSS/BLUE SHIELD

## 2015-09-21 LAB — SURGICAL PATHOLOGY

## 2015-09-21 LAB — CREATININE, SERUM
Creatinine, Ser: 0.47 mg/dL (ref 0.44–1.00)
GFR calc Af Amer: >60 mL/min (ref >60)
GFR calc non Af Amer: >60 mL/min (ref >60)

## 2015-09-21 MED ORDER — MENTHOL 3 MG MT LOZG
1.0000 | LOZENGE | OROMUCOSAL | Status: DC | PRN
  Filled 2015-09-21: qty 9

## 2015-09-21 NOTE — Progress Notes (Signed)
Patient ID: Connie Osborne, female   DOB: Jun 02, 1960, 55 y.o.   MRN: [id number] Pt appears to be much more comfortable, moving around lot easier . AVSS. Trial of water seal not effective. Air leak is less than it was 2 days ago. Chest incision is intact and clean. Lungs clear , pt with much better effort. Check CXR today. May consider Doxycycline instillation if leak does not stop in next day or two

## 2015-09-22 ENCOUNTER — Other Ambulatory Visit: Payer: Self-pay | Admitting: General Surgery

## 2015-09-22 ENCOUNTER — Other Ambulatory Visit: Payer: Self-pay

## 2015-09-22 ENCOUNTER — Inpatient Hospital Stay: Payer: BLUE CROSS/BLUE SHIELD

## 2015-09-22 DIAGNOSIS — Z9889 Other specified postprocedural states: Secondary | ICD-10-CM

## 2015-09-22 MED ORDER — DOXYCYCLINE HYCLATE 100 MG PO TABS: 100.0000 mg | ORAL_TABLET | Freq: Two times a day (BID) | ORAL | Status: DC

## 2015-09-22 MED ORDER — OXYCODONE HCL 5 MG PO TABS: 5.0000 mg | ORAL_TABLET | ORAL | 0 refills | Status: DC | PRN

## 2015-09-22 NOTE — Progress Notes (Signed)
Pt stable. IV removed. D/c instructions given and education provided. Signed prescriptions verified and given. Pt states she understands instructions. Pt dressed and escorted out by staff. Driven home by family.  

## 2015-09-22 NOTE — Discharge Summary (Signed)
Physician Discharge Summary  Patient ID: Connie Osborne MRN: [id number] DOB/AGE: Oct 10, 1960 55 y.o.  Admit date: 09/14/2015 Discharge date: 09/22/2015  Admission Diagnoses:Carcinoma right lung upper lobe History of breast cancer Colon polyp  Discharge Diagnoses: Same Active Problems:   Cancer of lung [facility])   Discharged Condition: good  Hospital Course: This is a 55 year old female who was initially treated couple years ago for left breast cancer with lumpectomy and radiation. The patient has been a smoker but quit smoking a few months ago. She underwent routine screening CT scan for long which showed a spiculated mass in the right upper lobe. Subsequent biopsy confirmed this to be a primary lung adenocarcinoma of theLepidic type. A PET scan showed activity in the left breast and in the sigmoid colon. Biopsy of the left breast proved be fat necrosis. Colonoscopy revealed a large polyp in the proximal sigmoid which was an adenomatous polyp with no dysplasia. Patient had PFTs done which were noted to be acceptable for lobectomy. She is agreeable to the surgery. On 09/14/2015 she underwent of right muscle sparing thoracotomy and a right lung upper lobectomy with the lymph node dissection. Postoperative course was basically uncomplicated. She had a persistence of air leak and accordingly the main anterior chest U was left in place. The posterior tube which was for drainage had been removed after 3 days. The patient also had a Blake drain placed in the subcutaneous tissue and this was also removed. The last 24 hours the patient was placed on a Pneumostat. Chest x-ray this morning shows a very small apical pneumothorax and it is felt that this is a smaller now for the patient be discharged home with the pneumostat.. At the time of discharge patient is noted to be free of any trouble breathing her pain is adequately managed with Percocet. Exam showed some scant erythema at the anterior end of the chest  incision without induration. The staples have been removed. The chest tube with the hemostat appears to be intact. Chest x-ray done today shows only a tiny apical pneumo. Patient is now being discharged to be followed as an outpatient with appointment on the 09/25/2015 with a follow-up chest x-ray. Consults: None  Significant Diagnostic Studies: radiology: X-Ray: Serial chest x-rays  Treatments: surgery: Right muscle sparing thoracotomy and right lung upper lobectomy  Discharge Exam: Blood pressure 127/80, pulse 73, temperature 98 F (36.7 C), temperature source Oral, resp. rate 17, height 5' (1.524 m), weight 157 lb (71.2 kg), SpO2 97 %. She is afebrile and vital signs are stable. Chest incision is scant erythema at the anterior and without induration. Staples removed. Chest tube and pneumostatic are intact  Disposition: 01-Home or Self Care  Discharge Instructions    Call MD for:  difficulty breathing, headache or visual disturbances    Complete by:  As directed   Call MD for:  redness, tenderness, or signs of infection (pain, swelling, redness, odor or green/yellow discharge around incision site)    Complete by:  As directed   Call MD for:  severe uncontrolled pain    Complete by:  As directed   Call MD for:  temperature >100.4    Complete by:  As directed   Diet - low sodium heart healthy    Complete by:  As directed   Discharge instructions    Complete by:  As directed   No exertional activity No showering No driving May use mild laxative prn Make sure the chest tube stays connected all the time.  Can empty fluid from the chamber with a syringe once or twice a day       Medication List    TAKE these medications   amLODipine 10 MG tablet Commonly known as:  NORVASC Take 10 mg by mouth daily with lunch.   cetirizine 10 MG tablet Commonly known as:  ZYRTEC Take 10 mg by mouth as needed for allergies.   losartan 100 MG tablet Commonly known as:  COZAAR Take 100 mg by mouth  daily with lunch.   metoprolol succinate 25 MG 24 hr tablet Commonly known as:  TOPROL-XL Take 25 mg by mouth daily with lunch.   oxyCODONE 5 MG immediate release tablet Commonly known as:  Oxy IR/ROXICODONE Take 1-2 tablets (5-10 mg total) by mouth every 4 (four) hours as needed for moderate pain.   umeclidinium-vilanterol 62.5-25 MCG/INH Aepb Commonly known as:  ANORO ELLIPTA Inhale 1 puff into the lungs daily. What changed:  when to take this      Follow-up Information    Christene Lye, MD. Schedule an appointment as soon as possible for a visit in 3 day(s).   Specialties:  General Surgery, Radiology Contact information: [address] [place] [address] [phone]           Signed: Christene Lye 09/22/2015, 9:11 AM

## 2015-09-25 ENCOUNTER — Encounter: Payer: Self-pay | Admitting: General Surgery

## 2015-09-25 ENCOUNTER — Ambulatory Visit
Admission: RE | Admit: 2015-09-25 | Discharge: 2015-09-25 | Disposition: A | Discharge status: Home / Self Care | Payer: BLUE CROSS/BLUE SHIELD | Source: Ambulatory Visit | Attending: General Surgery | Admitting: General Surgery

## 2015-09-25 ENCOUNTER — Ambulatory Visit (INDEPENDENT_AMBULATORY_CARE_PROVIDER_SITE_OTHER): Payer: BLUE CROSS/BLUE SHIELD | Admitting: General Surgery

## 2015-09-25 VITALS — BP 140/80 | HR 82 | Resp 16 | Ht 63.0 in | Wt 170.0 lb

## 2015-09-25 DIAGNOSIS — J939 Pneumothorax, unspecified: Secondary | ICD-10-CM | POA: Insufficient documentation

## 2015-09-25 DIAGNOSIS — Z9889 Other specified postprocedural states: Secondary | ICD-10-CM | POA: Insufficient documentation

## 2015-09-25 DIAGNOSIS — J9811 Atelectasis: Secondary | ICD-10-CM | POA: Insufficient documentation

## 2015-09-25 DIAGNOSIS — C3411 Malignant neoplasm of upper lobe, right bronchus or lung: Secondary | ICD-10-CM

## 2015-09-25 NOTE — Progress Notes (Signed)
Patient ID: Connie Osborne, female   DOB: 1961-01-22, 55 y.o.   MRN: [id number]  Chief Complaint  Patient presents with  . Routine Post Op    Thoracotomy    HPI Connie Osborne is a 55 y.o. female here today for thoracotomy done on 09/14/15. Patient had x-ray done today.   I have reviewed the history of present illness with the patient.  HPI  Past Medical History:  Diagnosis Date  . Allergy   . Arthritis   . Breast cancer ([place]) 2014   Left- Radiation  . COPD (chronic obstructive pulmonary disease) ([place])   . Coughing up blood   . GERD (gastroesophageal reflux disease)   . Hypertension   . Shortness of breath dyspnea   . Vitamin D deficiency     Past Surgical History:  Procedure Laterality Date  . ABDOMINAL HYSTERECTOMY    . BREAST BIOPSY Left 2014   +  . BREAST EXCISIONAL BIOPSY Left 2014  . BREAST SURGERY Left 2014   lumpectomy  . COLONOSCOPY WITH PROPOFOL N/A 08/18/2015   Procedure: COLONOSCOPY WITH PROPOFOL;  Surgeon: Christene Lye, MD;  Location: [facility];  Service: Endoscopy;  Laterality: N/A;  . ELECTROMAGNETIC NAVIGATION BROCHOSCOPY Right 07/28/2015   Procedure: ELECTROMAGNETIC NAVIGATION BRONCHOSCOPY;  Surgeon: Flora Lipps, MD;  Location: [facility];  Service: Cardiopulmonary;  Laterality: Right;  . FOOT SURGERY    . FRACTURE SURGERY    . GANGLION CYST EXCISION    . THORACOTOMY/LOBECTOMY Right 09/14/2015   Procedure: THORACOTOMY/LOBECTOMY;  Surgeon: Christene Lye, MD;  Location: [facility] OR[facility];  Service: Thoracic;  Laterality: Right;    Family History  Problem Relation Age of Onset  . Cancer Sister 56    breast  . Breast cancer Sister 65    30  . Cancer Sister 51    breast  . Cancer Other     breast  . Lung cancer Brother   . Hypertension Mother   . Diabetes Mellitus II Mother   . Cancer Mother     mets  . Cancer Father     long cancer    Social History Social History  Substance Use Topics  . Smoking status: Former Smoker   Packs/day: 1.00    Years: 30.00    Types: Cigarettes    Quit date: 07/30/2015  . Smokeless tobacco: Never Used  . Alcohol use No    Allergies  Allergen Reactions  . Accupril [Quinapril Hcl] Cough  . Percocet [Oxycodone-Acetaminophen] Other (See Comments)    Reaction: Unknown    Current Outpatient Prescriptions  Medication Sig Dispense Refill  . amLODipine (NORVASC) 10 MG tablet Take 10 mg by mouth daily with lunch.     . cetirizine (ZYRTEC) 10 MG tablet Take 10 mg by mouth as needed for allergies.    Marland Kitchen losartan (COZAAR) 100 MG tablet Take 100 mg by mouth daily with lunch.     . metoprolol succinate (TOPROL-XL) 25 MG 24 hr tablet Take 25 mg by mouth daily with lunch.     . oxyCODONE (OXY IR/ROXICODONE) 5 MG immediate release tablet Take 1-2 tablets (5-10 mg total) by mouth every 4 (four) hours as needed for moderate pain. 30 tablet 0  . umeclidinium-vilanterol (ANORO ELLIPTA) 62.5-25 MCG/INH AEPB Inhale 1 puff into the lungs daily. (Patient taking differently: Inhale 1 puff into the lungs daily with lunch. ) 60 each 5   Current Facility-Administered Medications  Medication Dose Route Frequency Provider Last Rate Last Dose  . doxycycline (  VIBRA-TABS) tablet 100 mg  100 mg Oral Q12H Seeplaputhur Robinette Haines, MD        Review of Systems Review of Systems  Constitutional: Negative.   Respiratory: Negative.   Cardiovascular: Negative.     Blood pressure 140/80, pulse 82, resp. rate 16, height '5\' 3"'$  (1.6 m), weight 170 lb (77.1 kg).  Physical Exam Physical Exam  Constitutional: She is oriented to person, place, and time. She appears well-developed and well-nourished.  Cardiovascular: Normal rate, regular rhythm and normal heart sounds.   Pulmonary/Chest: Effort normal and breath sounds normal.  Neurological: She is oriented to person, place, and time.  Skin: Skin is warm and dry.  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 Pt sent home from hospital 3 days ago with chest tube on a pneumostat.  Pneumo is almost fully resolved.   Plan        Patient to have a x-ray on 09/28/15 and than return to office afterwards.  This information has been scribed by Gaspar Cola CMA.    Gaspar Cola 09/25/2015, 8:55 AM

## 2015-09-25 NOTE — Patient Instructions (Signed)
Patient to have a x-ray on 09/28/15 and than return to office afterwards.

## 2015-09-28 ENCOUNTER — Ambulatory Visit
Admission: RE | Admit: 2015-09-28 | Discharge: 2015-09-28 | Disposition: A | Discharge status: Home / Self Care | Payer: BLUE CROSS/BLUE SHIELD | Source: Ambulatory Visit | Attending: General Surgery | Admitting: General Surgery

## 2015-09-28 ENCOUNTER — Encounter: Payer: Self-pay | Admitting: General Surgery

## 2015-09-28 ENCOUNTER — Ambulatory Visit (INDEPENDENT_AMBULATORY_CARE_PROVIDER_SITE_OTHER): Payer: BLUE CROSS/BLUE SHIELD | Admitting: General Surgery

## 2015-09-28 ENCOUNTER — Ambulatory Visit
Admission: AD | Admit: 2015-09-28 | Discharge: 2015-09-28 | Disposition: A | Discharge status: Home / Self Care | Payer: BLUE CROSS/BLUE SHIELD | Source: Ambulatory Visit | Attending: General Surgery | Admitting: General Surgery

## 2015-09-28 VITALS — BP 134/72 | HR 76 | Resp 14 | Ht 65.0 in | Wt 170.0 lb

## 2015-09-28 DIAGNOSIS — Z9889 Other specified postprocedural states: Secondary | ICD-10-CM | POA: Diagnosis present

## 2015-09-28 DIAGNOSIS — Z4682 Encounter for fitting and adjustment of non-vascular catheter: Secondary | ICD-10-CM | POA: Diagnosis not present

## 2015-09-28 DIAGNOSIS — J9811 Atelectasis: Secondary | ICD-10-CM | POA: Insufficient documentation

## 2015-09-28 NOTE — Progress Notes (Signed)
Patient ID: Connie Osborne, female   DOB: 1961-01-08, 55 y.o.   MRN: [id number]  Chief Complaint  Patient presents with  . Follow-up    Post Thoracotomy     HPI Connie Osborne is a 55 y.o. female here today for post thoracotomy. She had a chest xray done this morning. Patient states she had to use her rescue inhaler this morning, which helps.  HPI  Past Medical History:  Diagnosis Date  . Allergy   . Arthritis   . Breast cancer ([place]) 2014   Left- Radiation  . COPD (chronic obstructive pulmonary disease) ([place])   . Coughing up blood   . GERD (gastroesophageal reflux disease)   . Hypertension   . Shortness of breath dyspnea   . Vitamin D deficiency     Past Surgical History:  Procedure Laterality Date  . ABDOMINAL HYSTERECTOMY    . BREAST BIOPSY Left 2014   +  . BREAST EXCISIONAL BIOPSY Left 2014  . BREAST SURGERY Left 2014   lumpectomy  . COLONOSCOPY WITH PROPOFOL N/A 08/18/2015   Procedure: COLONOSCOPY WITH PROPOFOL;  Surgeon: Christene Lye, MD;  Location: [facility];  Service: Endoscopy;  Laterality: N/A;  . ELECTROMAGNETIC NAVIGATION BROCHOSCOPY Right 07/28/2015   Procedure: ELECTROMAGNETIC NAVIGATION BRONCHOSCOPY;  Surgeon: Flora Lipps, MD;  Location: [facility];  Service: Cardiopulmonary;  Laterality: Right;  . FOOT SURGERY    . FRACTURE SURGERY    . GANGLION CYST EXCISION    . THORACOTOMY/LOBECTOMY Right 09/14/2015   Procedure: THORACOTOMY/LOBECTOMY;  Surgeon: Christene Lye, MD;  Location: [facility];  Service: Thoracic;  Laterality: Right;    Family History  Problem Relation Age of Onset  . Cancer Sister 48    breast  . Breast cancer Sister 38    30  . Cancer Sister 44    breast  . Cancer Other     breast  . Lung cancer Brother   . Hypertension Mother   . Diabetes Mellitus II Mother   . Cancer Mother     mets  . Cancer Father     long cancer    Social History Social History  Substance Use Topics  . Smoking status: Former Smoker   Packs/day: 1.00    Years: 30.00    Types: Cigarettes    Quit date: 07/30/2015  . Smokeless tobacco: Never Used  . Alcohol use No    Allergies  Allergen Reactions  . Accupril [Quinapril Hcl] Cough  . Percocet [Oxycodone-Acetaminophen] Other (See Comments)    Reaction: Unknown    Current Outpatient Prescriptions  Medication Sig Dispense Refill  . amLODipine (NORVASC) 10 MG tablet Take 10 mg by mouth daily with lunch.     . cetirizine (ZYRTEC) 10 MG tablet Take 10 mg by mouth as needed for allergies.    Marland Kitchen losartan (COZAAR) 100 MG tablet Take 100 mg by mouth daily with lunch.     . metoprolol succinate (TOPROL-XL) 25 MG 24 hr tablet Take 25 mg by mouth daily with lunch.     . oxyCODONE (OXY IR/ROXICODONE) 5 MG immediate release tablet Take 1-2 tablets (5-10 mg total) by mouth every 4 (four) hours as needed for moderate pain. 30 tablet 0  . umeclidinium-vilanterol (ANORO ELLIPTA) 62.5-25 MCG/INH AEPB Inhale 1 puff into the lungs daily. (Patient taking differently: Inhale 1 puff into the lungs daily with lunch. ) 60 each 5   Current Facility-Administered Medications  Medication Dose Route Frequency Provider Last Rate Last Dose  .  doxycycline (VIBRA-TABS) tablet 100 mg  100 mg Oral Q12H Seeplaputhur Robinette Haines, MD        Review of Systems Review of Systems  Blood pressure 134/72, pulse 76, resp. rate 14, height '5\' 5"'$  (1.651 m), weight 170 lb (77.1 kg).  Physical Exam Physical Exam  Cardiovascular: Normal rate and regular rhythm.   Pulmonary/Chest: Effort normal and breath sounds normal.    Data Reviewed Chest x-ray prior to the visit was reviewed. No evidence of residual right pneumothorax.  Assessment    Doing well status post right thoracotomy and lobectomy.    Plan    The remaining right chest tube was draining without incident. [name] gauze followed by a Tegaderm dressing applied. The patient did have a small bubble of air beneath the Tegaderm dressing. She was observed  for dirty minutes without any recurrence. Post chest tube removal x-ray showed no pneumothorax.  The patient will follow-up 3 days with Dr. Jamal Osborne with a repeat chest x-ray at that time.        Connie Osborne 09/28/2015, 12:43 PM

## 2015-10-01 ENCOUNTER — Encounter (INDEPENDENT_AMBULATORY_CARE_PROVIDER_SITE_OTHER): Payer: Self-pay

## 2015-10-01 ENCOUNTER — Ambulatory Visit
Admission: RE | Admit: 2015-10-01 | Discharge: 2015-10-01 | Disposition: A | Discharge status: Home / Self Care | Payer: BLUE CROSS/BLUE SHIELD | Source: Ambulatory Visit | Attending: General Surgery | Admitting: General Surgery

## 2015-10-01 ENCOUNTER — Encounter: Payer: Self-pay | Admitting: General Surgery

## 2015-10-01 ENCOUNTER — Ambulatory Visit (INDEPENDENT_AMBULATORY_CARE_PROVIDER_SITE_OTHER): Payer: BLUE CROSS/BLUE SHIELD | Admitting: General Surgery

## 2015-10-01 VITALS — BP 130/74 | HR 82 | Resp 12 | Ht 65.0 in | Wt 158.0 lb

## 2015-10-01 DIAGNOSIS — C3411 Malignant neoplasm of upper lobe, right bronchus or lung: Secondary | ICD-10-CM

## 2015-10-01 DIAGNOSIS — Z9889 Other specified postprocedural states: Secondary | ICD-10-CM

## 2015-10-01 NOTE — Patient Instructions (Addendum)
The patient is aware to call back for any questions or concerns. Return on Monday for nurse Washburn.

## 2015-10-01 NOTE — Progress Notes (Signed)
Patient ID: Connie Osborne, female   DOB: August 29, 1960, 55 y.o.   MRN: [id number]  Chief Complaint  Patient presents with  . Routine Post Op    HPI Connie Osborne is a 55 y.o. female.  Here today for postoperative visit. She states she is doing well, still hurting some. She had her chest xray. She does have a productive cough, clear. Her chest tube was removed 3 days ago I have reviewed the history of present illness with the patient.  HPI  Past Medical History:  Diagnosis Date  . Allergy   . Arthritis   . Breast cancer ([place]) 2014   Left- Radiation  . COPD (chronic obstructive pulmonary disease) ([place])   . Coughing up blood   . GERD (gastroesophageal reflux disease)   . Hypertension   . Shortness of breath dyspnea   . Vitamin D deficiency     Past Surgical History:  Procedure Laterality Date  . ABDOMINAL HYSTERECTOMY    . BREAST BIOPSY Left 2014   +  . BREAST EXCISIONAL BIOPSY Left 2014  . BREAST SURGERY Left 2014   lumpectomy  . COLONOSCOPY WITH PROPOFOL N/A 08/18/2015   Procedure: COLONOSCOPY WITH PROPOFOL;  Surgeon: Christene Lye, MD;  Location: [facility];  Service: Endoscopy;  Laterality: N/A;  . ELECTROMAGNETIC NAVIGATION BROCHOSCOPY Right 07/28/2015   Procedure: ELECTROMAGNETIC NAVIGATION BRONCHOSCOPY;  Surgeon: Flora Lipps, MD;  Location: [facility];  Service: Cardiopulmonary;  Laterality: Right;  . FOOT SURGERY    . FRACTURE SURGERY    . GANGLION CYST EXCISION    . THORACOTOMY/LOBECTOMY Right 09/14/2015   Procedure: THORACOTOMY/LOBECTOMY;  Surgeon: Christene Lye, MD;  Location: [facility];  Service: Thoracic;  Laterality: Right;    Family History  Problem Relation Age of Onset  . Cancer Sister 56    breast  . Breast cancer Sister 10    30  . Cancer Sister 62    breast  . Cancer Other     breast  . Lung cancer Brother   . Hypertension Mother   . Diabetes Mellitus II Mother   . Cancer Mother     mets  . Cancer Father     long cancer     Social History Social History  Substance Use Topics  . Smoking status: Former Smoker    Packs/day: 1.00    Years: 30.00    Types: Cigarettes    Quit date: 07/30/2015  . Smokeless tobacco: Never Used  . Alcohol use No    Allergies  Allergen Reactions  . Accupril [Quinapril Hcl] Cough  . Percocet [Oxycodone-Acetaminophen] Other (See Comments)    Reaction: Unknown    Current Outpatient Prescriptions  Medication Sig Dispense Refill  . amLODipine (NORVASC) 10 MG tablet Take 10 mg by mouth daily with lunch.     . cetirizine (ZYRTEC) 10 MG tablet Take 10 mg by mouth as needed for allergies.    Marland Kitchen losartan (COZAAR) 100 MG tablet Take 100 mg by mouth daily with lunch.     . metoprolol succinate (TOPROL-XL) 25 MG 24 hr tablet Take 25 mg by mouth daily with lunch.     . oxyCODONE (OXY IR/ROXICODONE) 5 MG immediate release tablet Take 1-2 tablets (5-10 mg total) by mouth every 4 (four) hours as needed for moderate pain. 30 tablet 0  . umeclidinium-vilanterol (ANORO ELLIPTA) 62.5-25 MCG/INH AEPB Inhale 1 puff into the lungs daily. (Patient taking differently: Inhale 1 puff into the lungs daily with lunch. ) 60 each  5   Current Facility-Administered Medications  Medication Dose Route Frequency Provider Last Rate Last Dose  . doxycycline (VIBRA-TABS) tablet 100 mg  100 mg Oral Q12H Ernesteen Mihalic Robinette Haines, MD        Review of Systems Review of Systems  Constitutional: Negative.   Respiratory: Positive for cough.   Cardiovascular: Negative.     Blood pressure 130/74, pulse 82, resp. rate 12, height '5\' 5"'$  (1.651 m), weight 158 lb (71.7 kg).  Physical Exam Physical Exam  Constitutional: She is oriented to person, place, and time. She appears well-developed and well-nourished.  Neurological: She is alert and oriented to person, place, and time.  Skin: Skin is warm and dry.  Chest tube site is clean. All incisions are healing well. Lungs clear with mild decrease in right base. CXR-no  pneumo.  Data Reviewed Chest x ray  Assessment    Doing well post right lung upper lobectomy    Plan    Return on Monday for nurse-suture removal . Two weeks doctor visit.      This information has been scribed by Karie Fetch RN, BSN,BC.   Sharlet Notaro G 10/01/2015, 9:41 AM

## 2015-10-05 ENCOUNTER — Ambulatory Visit: Payer: BLUE CROSS/BLUE SHIELD | Admitting: *Deleted

## 2015-10-05 DIAGNOSIS — C3411 Malignant neoplasm of upper lobe, right bronchus or lung: Secondary | ICD-10-CM

## 2015-10-05 NOTE — Patient Instructions (Signed)
Return as scheduled 

## 2015-10-05 NOTE — Progress Notes (Signed)
The sutures were removed and steri strips applied.

## 2015-10-07 ENCOUNTER — Ambulatory Visit (INDEPENDENT_AMBULATORY_CARE_PROVIDER_SITE_OTHER): Payer: BLUE CROSS/BLUE SHIELD | Admitting: Pulmonary Disease

## 2015-10-07 ENCOUNTER — Encounter: Payer: Self-pay | Admitting: Pulmonary Disease

## 2015-10-07 ENCOUNTER — Inpatient Hospital Stay: Payer: BLUE CROSS/BLUE SHIELD | Admitting: Internal Medicine

## 2015-10-07 VITALS — BP 150/90 | HR 80 | Ht 65.0 in | Wt 158.4 lb

## 2015-10-07 DIAGNOSIS — J449 Chronic obstructive pulmonary disease, unspecified: Secondary | ICD-10-CM | POA: Diagnosis not present

## 2015-10-07 DIAGNOSIS — J329 Chronic sinusitis, unspecified: Secondary | ICD-10-CM

## 2015-10-07 DIAGNOSIS — Z720 Tobacco use: Secondary | ICD-10-CM | POA: Diagnosis not present

## 2015-10-07 DIAGNOSIS — F17200 Nicotine dependence, unspecified, uncomplicated: Secondary | ICD-10-CM

## 2015-10-07 DIAGNOSIS — C3490 Malignant neoplasm of unspecified part of unspecified bronchus or lung: Secondary | ICD-10-CM

## 2015-10-07 MED ORDER — TRIAMCINOLONE ACETONIDE 55 MCG/ACT NA AERO: 2.0000 | INHALATION_SPRAY | Freq: Every day | NASAL | 12 refills | Status: DC

## 2015-10-07 NOTE — Progress Notes (Signed)
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 Recovering nicely. Chest tube out last week  SUBJ: Recovering nicely from surgery. Not smoking. No new complaints. Gradually returning to her prior level of activity. Denies CP, fever, purulent sputum, hemoptysis, LE edema and calf tenderness. Reports posterior nasal drainage and cough  OBJ: Vitals:   10/07/15 0858  BP: (!) 150/90  Pulse: 80  SpO2: 95%  Weight: 158 lb 6.4 oz (71.8 kg)  Height: '5\' 5"'$  (1.651 m)   NAD HEENT WNL BS full without wheezes RRR s M NABS, soft No focal neurological deficits   DATA: No new Xray or PFT data  IMPRESSION: Smoker  Chronic obstructive pulmonary disease, unspecified COPD type ([place])  Malignant neoplasm of lung, unspecified laterality, unspecified part of lung (HCC)  Rhinosinusitis    PLAN: 1) congratulations on smoking cessation. Keep it up 2) Continue Anoro inhaler 3) Begin Nasacort inhaler 4) counseled re: risk of smoking relapse and strategies to prevent 5) ROV 3-4 months  Merton Border, MD PCCM service Mobile [phone] Pager [phone] 10/07/2015

## 2015-10-07 NOTE — Patient Instructions (Signed)
1) congratulations on smoking cessation. Keep it up 2) Continue Anoro inhaler 3) Begin Nasacort inhaler

## 2015-10-14 ENCOUNTER — Encounter: Payer: Self-pay | Admitting: General Surgery

## 2015-10-14 ENCOUNTER — Other Ambulatory Visit: Payer: Self-pay | Admitting: Internal Medicine

## 2015-10-14 ENCOUNTER — Ambulatory Visit (INDEPENDENT_AMBULATORY_CARE_PROVIDER_SITE_OTHER): Payer: BLUE CROSS/BLUE SHIELD | Admitting: General Surgery

## 2015-10-14 VITALS — BP 140/80 | HR 74 | Resp 14 | Ht 65.0 in | Wt 164.0 lb

## 2015-10-14 DIAGNOSIS — C3411 Malignant neoplasm of upper lobe, right bronchus or lung: Secondary | ICD-10-CM

## 2015-10-14 NOTE — Progress Notes (Signed)
Patient ID: Connie Osborne, female   DOB: December 08, 1960, 55 y.o.   MRN: [id number]  Chief Complaint  Patient presents with  . Follow-up    thoracotomy    HPI Connie Osborne is a 55 y.o. female here today a follow up on her thoracotomy and right lung upper lobectomy done on 7/17/7. Patient states she is doing well. She notes that her breathing is significantly better and she's been ambulating almost one mile per day. Area around the incision is tender, particularly when coughing. I have reviewed the history of present illness with the patient.  HPI  Past Medical History:  Diagnosis Date  . Allergy   . Arthritis   . Breast cancer ([place]) 2014   Left- Radiation  . COPD (chronic obstructive pulmonary disease) ([place])   . Coughing up blood   . GERD (gastroesophageal reflux disease)   . Hypertension   . Shortness of breath dyspnea   . Vitamin D deficiency     Past Surgical History:  Procedure Laterality Date  . ABDOMINAL HYSTERECTOMY    . BREAST BIOPSY Left 2014   +  . BREAST EXCISIONAL BIOPSY Left 2014  . BREAST SURGERY Left 2014   lumpectomy  . COLONOSCOPY WITH PROPOFOL N/A 08/18/2015   Procedure: COLONOSCOPY WITH PROPOFOL;  Surgeon: Christene Lye, MD;  Location: [facility];  Service: Endoscopy;  Laterality: N/A;  . ELECTROMAGNETIC NAVIGATION BROCHOSCOPY Right 07/28/2015   Procedure: ELECTROMAGNETIC NAVIGATION BRONCHOSCOPY;  Surgeon: Flora Lipps, MD;  Location: [facility];  Service: Cardiopulmonary;  Laterality: Right;  . FOOT SURGERY    . FRACTURE SURGERY    . GANGLION CYST EXCISION    . THORACOTOMY/LOBECTOMY Right 09/14/2015   Procedure: THORACOTOMY/LOBECTOMY;  Surgeon: Christene Lye, MD;  Location: [facility];  Service: Thoracic;  Laterality: Right;    Family History  Problem Relation Age of Onset  . Cancer Sister 41    breast  . Breast cancer Sister 33    30  . Cancer Sister 30    breast  . Cancer Other     breast  . Lung cancer Brother   . Hypertension  Mother   . Diabetes Mellitus II Mother   . Cancer Mother     mets  . Cancer Father     long cancer    Social History Social History  Substance Use Topics  . Smoking status: Former Smoker    Packs/day: 1.00    Years: 30.00    Types: Cigarettes    Quit date: 07/30/2015  . Smokeless tobacco: Never Used  . Alcohol use No    Allergies  Allergen Reactions  . Accupril [Quinapril Hcl] Cough  . Percocet [Oxycodone-Acetaminophen] Other (See Comments)    Reaction: Unknown    Current Outpatient Prescriptions  Medication Sig Dispense Refill  . amLODipine (NORVASC) 10 MG tablet Take 10 mg by mouth daily with lunch.     . cetirizine (ZYRTEC) 10 MG tablet Take 10 mg by mouth as needed for allergies.    Marland Kitchen losartan (COZAAR) 100 MG tablet Take 100 mg by mouth daily with lunch.     . metoprolol succinate (TOPROL-XL) 25 MG 24 hr tablet Take 25 mg by mouth daily with lunch.     . oxyCODONE (OXY IR/ROXICODONE) 5 MG immediate release tablet Take 1-2 tablets (5-10 mg total) by mouth every 4 (four) hours as needed for moderate pain. 30 tablet 0  . triamcinolone (NASACORT AQ) 55 MCG/ACT AERO nasal inhaler Place 2 sprays into the  nose daily. 1 Inhaler 12  . umeclidinium-vilanterol (ANORO ELLIPTA) 62.5-25 MCG/INH AEPB Inhale 1 puff into the lungs daily. (Patient taking differently: Inhale 1 puff into the lungs daily with lunch. ) 60 each 5   Current Facility-Administered Medications  Medication Dose Route Frequency Provider Last Rate Last Dose  . doxycycline (VIBRA-TABS) tablet 100 mg  100 mg Oral Q12H Seeplaputhur Robinette Haines, MD        Review of Systems Review of Systems  Constitutional: Negative.   Respiratory: Negative.   Cardiovascular: Negative.     Blood pressure 140/80, pulse 74, resp. rate 14, height '5\' 5"'$  (1.651 m), weight 164 lb (74.4 kg).  Physical Exam Physical Exam  Constitutional: She is oriented to person, place, and time. She appears well-developed and well-nourished.    Pulmonary/Chest: Effort normal and breath sounds normal. No respiratory distress.        Neurological: She is alert and oriented to person, place, and time.  Skin: Skin is warm and dry.    Data Reviewed Prior notes, pathology report  Assessment    DCIS, left breast Cancer, right upper lobe of the lung History of colon polyps Family history of breast cancer     Plan  Patient to see Dr. Dorthula Perfect on 8/28 - he is to decide on resuming breast cancer anti-hormonal treatment-her DCIS was very weakly pos for Er/PR Patient to stay out of work for another two weeks   Patient will need a colonoscopy next year  Follow up in three months  This information has been scribed by Gaspar Cola CMA.     SANKAR,SEEPLAPUTHUR G 10/15/2015, 11:53 AM

## 2015-10-14 NOTE — Patient Instructions (Signed)
Patient to retrun

## 2015-10-26 ENCOUNTER — Inpatient Hospital Stay: Payer: BLUE CROSS/BLUE SHIELD | Attending: Internal Medicine | Admitting: Internal Medicine

## 2015-10-26 VITALS — BP 134/82 | HR 60 | Temp 98.0°F | Resp 18 | Wt 162.2 lb

## 2015-10-26 DIAGNOSIS — Z1501 Genetic susceptibility to malignant neoplasm of breast: Secondary | ICD-10-CM | POA: Insufficient documentation

## 2015-10-26 DIAGNOSIS — Z86000 Personal history of in-situ neoplasm of breast: Secondary | ICD-10-CM | POA: Insufficient documentation

## 2015-10-26 DIAGNOSIS — C3411 Malignant neoplasm of upper lobe, right bronchus or lung: Secondary | ICD-10-CM | POA: Diagnosis not present

## 2015-10-26 DIAGNOSIS — Z801 Family history of malignant neoplasm of trachea, bronchus and lung: Secondary | ICD-10-CM | POA: Insufficient documentation

## 2015-10-26 DIAGNOSIS — J449 Chronic obstructive pulmonary disease, unspecified: Secondary | ICD-10-CM | POA: Diagnosis not present

## 2015-10-26 DIAGNOSIS — Z87891 Personal history of nicotine dependence: Secondary | ICD-10-CM | POA: Diagnosis not present

## 2015-10-26 DIAGNOSIS — Z148 Genetic carrier of other disease: Secondary | ICD-10-CM | POA: Insufficient documentation

## 2015-10-26 DIAGNOSIS — Z923 Personal history of irradiation: Secondary | ICD-10-CM | POA: Insufficient documentation

## 2015-10-26 DIAGNOSIS — I10 Essential (primary) hypertension: Secondary | ICD-10-CM | POA: Insufficient documentation

## 2015-10-26 DIAGNOSIS — Z1509 Genetic susceptibility to other malignant neoplasm: Secondary | ICD-10-CM

## 2015-10-26 DIAGNOSIS — Z803 Family history of malignant neoplasm of breast: Secondary | ICD-10-CM | POA: Diagnosis not present

## 2015-10-26 DIAGNOSIS — Z9223 Personal history of estrogen therapy: Secondary | ICD-10-CM

## 2015-10-26 DIAGNOSIS — K219 Gastro-esophageal reflux disease without esophagitis: Secondary | ICD-10-CM

## 2015-10-26 DIAGNOSIS — Z79899 Other long term (current) drug therapy: Secondary | ICD-10-CM | POA: Insufficient documentation

## 2015-10-26 NOTE — Progress Notes (Signed)
[place] CONSULT NOTE  Patient Care Team: [facility] as PCP - General (General Practice) Seeplaputhur Robinette Haines, MD (General Surgery) Kennieth Francois, MD (Inactive) (Hematology and Oncology) Cammie Sickle, MD as Consulting Physician (Internal Medicine)  CHIEF COMPLAINTS/PURPOSE OF CONSULTATION:   Oncology History   #  June 2017- STAGE I [pT1a pN0] RUL Non-small cell Ca [favor adeno s/p ENB/FNA]; No adj therapy.   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  Patient complains of mild pain at the site of the surgery/right chest wall. Otherwise denies any fever or chills. Denies any shortness of breath. She admits to quitting smoking.  ROS: A complete 10 point review of system is done which is negative except mentioned above in history of present illness  MEDICAL HISTORY:  Past Medical History:  Diagnosis Date  . Allergy   . Arthritis   . Breast cancer ([place]) 2014   Left- Radiation  . COPD (chronic obstructive pulmonary disease) ([place])   . Coughing up blood   . GERD (gastroesophageal reflux disease)   . Hypertension   . Shortness of breath dyspnea   . Vitamin D deficiency     SURGICAL HISTORY: Past Surgical History:  Procedure Laterality Date  . ABDOMINAL HYSTERECTOMY    . BREAST BIOPSY Left 2014   +  . BREAST  EXCISIONAL BIOPSY Left 2014  . BREAST SURGERY Left 2014   lumpectomy  . COLONOSCOPY WITH PROPOFOL N/A 08/18/2015   Procedure: COLONOSCOPY WITH PROPOFOL;  Surgeon: Christene Lye, MD;  Location: [facility] ENDOSCOPY;  Service: Endoscopy;  Laterality: N/A;  . ELECTROMAGNETIC NAVIGATION BROCHOSCOPY Right 07/28/2015   Procedure: ELECTROMAGNETIC NAVIGATION BRONCHOSCOPY;  Surgeon: Flora Lipps, MD;  Location: [facility] ORS;  Service: Cardiopulmonary;  Laterality: Right;  . FOOT SURGERY    . FRACTURE SURGERY    . GANGLION CYST EXCISION    . THORACOTOMY/LOBECTOMY Right 09/14/2015   Procedure: THORACOTOMY/LOBECTOMY;  Surgeon: Christene Lye, MD;  Location: [facility] ORS;  Service: Thoracic;  Laterality: Right;    SOCIAL HISTORY: Social History   Social History  . Marital status: Divorced    Spouse name: N/A  . Number of children: N/A  . Years of education: N/A   Occupational History  . Not on file.   Social History Main Topics  . Smoking status: Former Smoker    Packs/day: 1.00    Years: 30.00    Types: Cigarettes    Quit date: 07/30/2015  . Smokeless tobacco: Never Used  . Alcohol use No  . Drug use: No  . Sexual activity: Not on file   Other Topics Concern  . Not on file   Social History Narrative  . No narrative on file    FAMILY HISTORY: Family History  Problem Relation Age of Onset  .  Cancer Sister 68    breast  . Breast cancer Sister 32    30  . Cancer Sister 23    breast  . Cancer Other     breast  . Lung cancer Brother   . Hypertension Mother   . Diabetes Mellitus II Mother   . Cancer Mother     mets  . Cancer Father     long cancer    ALLERGIES:  is allergic to accupril [quinapril hcl] and percocet [oxycodone-acetaminophen].  MEDICATIONS:  Current Outpatient Prescriptions  Medication Sig Dispense Refill  . amLODipine (NORVASC) 10 MG tablet Take 10 mg by mouth daily with lunch.     . cetirizine (ZYRTEC) 10 MG tablet Take 10 mg by mouth as needed for  allergies.    Marland Kitchen losartan (COZAAR) 100 MG tablet Take 100 mg by mouth daily with lunch.     . metoprolol succinate (TOPROL-XL) 25 MG 24 hr tablet Take 25 mg by mouth daily with lunch.     . oxyCODONE (OXY IR/ROXICODONE) 5 MG immediate release tablet Take 1-2 tablets (5-10 mg total) by mouth every 4 (four) hours as needed for moderate pain. 30 tablet 0  . triamcinolone (NASACORT AQ) 55 MCG/ACT AERO nasal inhaler Place 2 sprays into the nose daily. 1 Inhaler 12  . umeclidinium-vilanterol (ANORO ELLIPTA) 62.5-25 MCG/INH AEPB Inhale 1 puff into the lungs daily. (Patient taking differently: Inhale 1 puff into the lungs daily with lunch. ) 60 each 5   Current Facility-Administered Medications  Medication Dose Route Frequency Provider Last Rate Last Dose  . doxycycline (VIBRA-TABS) tablet 100 mg  100 mg Oral Q12H Seeplaputhur Robinette Haines, MD          .  PHYSICAL EXAMINATION: ECOG PERFORMANCE STATUS: 0 - Asymptomatic  Vitals:   10/26/15 1512  BP: 134/82  Pulse: 60  Resp: 18  Temp: 98 F (36.7 C)   Filed Weights   10/26/15 1512  Weight: 162 lb 4 oz (73.6 kg)    GENERAL: Well-nourished well-developed; Alert, no distress and comfortable.   Alone.  EYES: no pallor or icterus OROPHARYNX: no thrush or ulceration; good dentition  NECK: supple, no masses felt LYMPH:  no palpable lymphadenopathy in the cervical, axillary or inguinal regions LUNGS: clear to auscultation and  No wheeze or crackles HEART/CVS: regular rate & rhythm and no murmurs; No lower extremity edema ABDOMEN: abdomen soft, non-tender and normal bowel sounds Musculoskeletal:no cyanosis of digits and no clubbing  PSYCH: alert & oriented x 3 with fluent speech NEURO: no focal motor/sensory deficits SKIN:  no rashes or significant lesions   LABORATORY DATA:  I have reviewed the data as listed Lab Results  Component Value Date   WBC 13.5 (H) 09/15/2015   HGB 11.9 (L) 09/15/2015   HCT 34.3 (L) 09/15/2015   MCV 94.2  09/15/2015   PLT 260 09/15/2015    Recent Labs  08/13/15 1017  09/15/15 0434 09/16/15 0533 09/18/15 0412 09/21/15 0531  NA 143  --  138 136  --   --   K 4.7  --  3.3* 3.2* 3.4*  --   CL 103  --  108 102  --   --   CO2 26  --  26 26  --   --   GLUCOSE 104*  --  133* 151*  --   --   BUN 12  --  9 9  --   --   CREATININE 0.69  < > 0.50 0.46  --  0.47  CALCIUM 9.9  --  8.7* 8.6*  --   --   GFRNONAA 98  < > >60 >60  --  >60  GFRAA 113  < > >60 >60  --  >60  PROT 6.9  --   --   --   --   --   ALBUMIN 4.4  --   --   --   --   --   AST 13  --   --   --   --   --   ALT 13  --   --   --   --   --   ALKPHOS 121*  --   --   --   --   --   BILITOT <0.2  --   --   --   --   --   < > = values in this interval not displayed.  RADIOGRAPHIC STUDIES: I have personally reviewed the radiological images as listed and agreed with the findings in the report. Dg Chest 1 View  Result Date: 10/01/2015 CLINICAL DATA:  Post thoracotomy and right upper lobectomy for adenocarcinoma, followup EXAM: CHEST 1 VIEW COMPARISON:  Portable chest x-ray of 09/28/2015 FINDINGS: Postoperative linear opacity in the right upper lung field is unchanged with volume loss on the right and subsequent slight elevation of the right hemidiaphragm. Linear atelectasis or scarring remains at the left lung base. Heart size is stable. IMPRESSION: Stable postoperative change on the right with volume loss and subsequent elevation of the right hemidiaphragm. Electronically Signed   By: Ivar Drape M.D.   On: 10/01/2015 09:02   Dg Chest 1 View  Result Date: 09/28/2015 CLINICAL DATA:  Tube removal. EXAM: CHEST 1 VIEW COMPARISON:  09/28/2015 FINDINGS: Postoperative changes in the right upper lobe. Interval removal of right chest tube. No visible pneumothorax. Bibasilar atelectasis. Heart is normal size. IMPRESSION: Interval removal of the right chest tube. No pneumothorax. Postoperative changes on the right. Bibasilar atelectasis.  Electronically Signed   By: Rolm Baptise M.D.   On: 09/28/2015 10:23  Dg Chest 1 View  Result Date: 09/28/2015 CLINICAL DATA:  Status post thoracotomy 2 weeks ago. Evaluate for leak. Known chest tube. EXAM: CHEST 1 VIEW COMPARISON:  Multiple chest x-rays since September of 2014. FINDINGS: The right chest tube is stable. No definitive remaining pneumothorax is identified. Elevation of the right hemidiaphragm is noted. Pleural thickening versus small effusion on the right. Atelectasis in the left base. The cardiomediastinal silhouette is stable. No other interval changes or acute abnormalities. IMPRESSION: The right chest tube is stable. Postsurgical changes in the right chest. No definitive remaining pneumothorax. Electronically Signed   By: Dorise Bullion III M.D   On: 09/28/2015 08:53   ASSESSMENT & PLAN:   Primary cancer of right upper lobe of lung ([place]) # Right UL adeno ca s/p lobectomy- STAGE I- no adjuvant therapy.  # Left breast DCIS-status post lumpectomy followed by radiation. Recent mammogram- June 2017 NED.  #  BRCA-1 positive. Long discussion the patient regarding- risk of breast cancer and ovarian cancer. Given her intolerance to antihormone therapy; and a previous history of DCIS- I strongly recommend prophylactic mastectomy bilateral. Also discussed regarding bilateral nephrectomy. Patient seems to be overwhelmed at this time. However she is open to options.   # follow up in 4 months/ no labs.     Cammie Sickle, MD 10/26/2015 4:46 PM

## 2015-10-26 NOTE — Progress Notes (Signed)
Noted discussion with pt. I will address the same with her on her next appt here

## 2015-10-26 NOTE — Assessment & Plan Note (Addendum)
#  Right UL adeno ca s/p lobectomy- STAGE I- no adjuvant therapy.  # Left breast DCIS-status post lumpectomy followed by radiation. Recent mammogram- June 2017 NED.  #  BRCA-1 positive. Long discussion the patient regarding- risk of breast cancer and ovarian cancer. Given her intolerance to antihormone therapy; and a previous history of DCIS- I strongly recommend prophylactic mastectomy bilateral. Also discussed regarding bilateral nephrectomy. Patient seems to be overwhelmed at this time. However she is open to options.   # follow up in 4 months/ no labs.

## 2015-11-26 ENCOUNTER — Encounter: Payer: Self-pay | Admitting: *Deleted

## 2016-01-12 ENCOUNTER — Ambulatory Visit: Payer: Self-pay | Admitting: General Surgery

## 2016-02-04 ENCOUNTER — Ambulatory Visit (INDEPENDENT_AMBULATORY_CARE_PROVIDER_SITE_OTHER): Payer: BLUE CROSS/BLUE SHIELD | Admitting: General Surgery

## 2016-02-04 ENCOUNTER — Encounter: Payer: Self-pay | Admitting: General Surgery

## 2016-02-04 VITALS — BP 148/84 | HR 76 | Resp 16 | Ht 65.0 in | Wt 170.0 lb

## 2016-02-04 DIAGNOSIS — Z1501 Genetic susceptibility to malignant neoplasm of breast: Secondary | ICD-10-CM

## 2016-02-04 DIAGNOSIS — Z1502 Genetic susceptibility to malignant neoplasm of ovary: Secondary | ICD-10-CM

## 2016-02-04 DIAGNOSIS — Z86010 Personal history of colonic polyps: Secondary | ICD-10-CM

## 2016-02-04 DIAGNOSIS — C3411 Malignant neoplasm of upper lobe, right bronchus or lung: Secondary | ICD-10-CM | POA: Diagnosis not present

## 2016-02-04 DIAGNOSIS — D0512 Intraductal carcinoma in situ of left breast: Secondary | ICD-10-CM

## 2016-02-04 DIAGNOSIS — Z1509 Genetic susceptibility to other malignant neoplasm: Secondary | ICD-10-CM

## 2016-02-04 NOTE — Progress Notes (Signed)
Patient ID: Connie Osborne, female   DOB: 24-Jul-1960, 55 y.o.   MRN: [id number]  No chief complaint on file.   HPI Connie Osborne is a 54 y.o. female here today for her three month follow up lung cancer and left breast cancer DCIS.Patient states she is doing well.  I have reviewed the history of present illness with the patient.  HPI Pt had left breast DCIS weakly pos for ER/PR. Rx- lumpectomy, radiation. She is BRCA 1 carrier 57mo ago noted she wahad a small adenoca right lung upper lobe- had lobectomy. Final path showed no CA-only seen on initial needle biopsy. Large sigmoid polyp removed also 6 mos ago.   Past Medical History:  Diagnosis Date  . Allergy   . Arthritis   . [place] 2014   Left- Radiation  . COPD (chronic obstructive pulmonary disease) ([place]   . Coughing up blood   . GERD (gastroesophageal reflux disease)   . Hypertension   . Shortness of breath dyspnea   . Vitamin D deficiency     Past Surgical History:  Procedure Laterality Date  . ABDOMINAL HYSTERECTOMY    . BREAST BIOPSY Left 2014   +  . BREAST EXCISIONAL BIOPSY Left 2014  . BREAST SURGERY Left 2014   lumpectomy  . COLONOSCOPY WITH PROPOFOL N/A 08/18/2015   Procedure: COLONOSCOPY WITH PROPOFOL;  Surgeon: SChristene Lye MD;  Location: [facility] ENDOSCOPY;  Service: Endoscopy;  Laterality: N/A;  . ELECTROMAGNETIC NAVIGATION BROCHOSCOPY Right 07/28/2015   Procedure: ELECTROMAGNETIC NAVIGATION BRONCHOSCOPY;  Surgeon: KFlora Lipps MD;  Location: [facility] ORS;  Service: Cardiopulmonary;  Laterality: Right;  . FOOT SURGERY    . FRACTURE SURGERY    . GANGLION CYST EXCISION    . THORACOTOMY/LOBECTOMY Right 09/14/2015   Procedure: THORACOTOMY/LOBECTOMY;  Surgeon: SChristene Lye MD;  Location: [facility] ORS;  Service: Thoracic;  Laterality: Right;    Family History  Problem Relation Age of Onset  . Cancer Sister 329   breast  . Breast cancer Sister 384    30 . Cancer Sister 346   breast  .  Cancer Other     breast  . Lung cancer Brother   . Hypertension Mother   . Diabetes Mellitus II Mother   . Cancer Mother     mets  . Cancer Father     long cancer    Social History Social History  Substance Use Topics  . Smoking status: Former Smoker    Packs/day: 1.00    Years: 30.00    Types: Cigarettes    Quit date: 07/30/2015  . Smokeless tobacco: Never Used  . Alcohol use No    Allergies  Allergen Reactions  . Accupril [Quinapril Hcl] Cough  . Percocet [Oxycodone-Acetaminophen] Other (See Comments)    Reaction: Unknown    Current Outpatient Prescriptions  Medication Sig Dispense Refill  . amLODipine (NORVASC) 10 MG tablet Take 10 mg by mouth daily with lunch.     . cetirizine (ZYRTEC) 10 MG tablet Take 10 mg by mouth as needed for allergies.    .Marland Kitchenlosartan (COZAAR) 100 MG tablet Take 100 mg by mouth daily with lunch.     . metoprolol succinate (TOPROL-XL) 25 MG 24 hr tablet Take 25 mg by mouth daily with lunch.     . triamcinolone (NASACORT AQ) 55 MCG/ACT AERO nasal inhaler Place 2 sprays into the nose daily. 1 Inhaler 12  . umeclidinium-vilanterol (ANORO ELLIPTA) 62.5-25 MCG/INH AEPB Inhale 1 puff  into the lungs daily. (Patient taking differently: Inhale 1 puff into the lungs daily with lunch. ) 60 each 5   No current facility-administered medications for this visit.     Review of Systems Review of Systems  Constitutional: Negative.   Respiratory: Negative.   Cardiovascular: Negative.     Blood pressure (!) 148/84, pulse 76, resp. rate 16, height 5' 5"  (1.651 m), weight 170 lb (77.1 kg).  Physical Exam Physical Exam  Constitutional: She is oriented to person, place, and time. She appears well-developed and well-nourished.  Eyes: Conjunctivae are normal. No scleral icterus.  Neck: Neck supple.  Cardiovascular: Normal rate, regular rhythm and normal heart sounds.   Pulmonary/Chest: Effort normal and breath sounds normal.     Right breast exhibits no  inverted nipple, no mass, no nipple discharge, no skin change and no tenderness. Left breast exhibits no inverted nipple, no mass, no nipple discharge, no skin change and no tenderness.  Abdominal: Soft. Bowel sounds are normal. There is no tenderness.  Lymphadenopathy:    She has no cervical adenopathy.    She has no axillary adenopathy.  Neurological: She is alert and oriented to person, place, and time.  Skin: Skin is warm and dry.    Data Reviewed None  Assessment    Stable exam ,Cancer, right upper lobe of the lung DCIS, left breast BRCA 1 carrier Colon polyp     Plan    Return in six months bilateral diagnotic mammogram. Subsequent to that will schedule colonoscopy. Lung CA follow up- not sure if she needs any imaging for surveillance-will discuss with oncology  This information has been scribed by Gaspar Cola CMA.        SANKAR,SEEPLAPUTHUR G 02/04/2016, 9:11 AM

## 2016-02-04 NOTE — Patient Instructions (Signed)
Return in six months bilateral diagnotic mammogram.

## 2016-02-25 ENCOUNTER — Ambulatory Visit: Payer: BLUE CROSS/BLUE SHIELD | Admitting: Internal Medicine

## 2016-03-07 ENCOUNTER — Inpatient Hospital Stay: Payer: BLUE CROSS/BLUE SHIELD | Admitting: Internal Medicine

## 2016-03-21 ENCOUNTER — Inpatient Hospital Stay: Payer: BLUE CROSS/BLUE SHIELD | Attending: Internal Medicine | Admitting: Internal Medicine

## 2016-03-21 VITALS — BP 106/71 | HR 75 | Temp 97.4°F | Wt 177.4 lb

## 2016-03-21 DIAGNOSIS — Z86000 Personal history of in-situ neoplasm of breast: Secondary | ICD-10-CM | POA: Diagnosis not present

## 2016-03-21 DIAGNOSIS — Z9013 Acquired absence of bilateral breasts and nipples: Secondary | ICD-10-CM

## 2016-03-21 DIAGNOSIS — Z801 Family history of malignant neoplasm of trachea, bronchus and lung: Secondary | ICD-10-CM | POA: Insufficient documentation

## 2016-03-21 DIAGNOSIS — Z1501 Genetic susceptibility to malignant neoplasm of breast: Secondary | ICD-10-CM | POA: Diagnosis not present

## 2016-03-21 DIAGNOSIS — C3411 Malignant neoplasm of upper lobe, right bronchus or lung: Secondary | ICD-10-CM | POA: Insufficient documentation

## 2016-03-21 DIAGNOSIS — I10 Essential (primary) hypertension: Secondary | ICD-10-CM | POA: Diagnosis not present

## 2016-03-21 DIAGNOSIS — K219 Gastro-esophageal reflux disease without esophagitis: Secondary | ICD-10-CM | POA: Diagnosis not present

## 2016-03-21 DIAGNOSIS — Z1502 Genetic susceptibility to malignant neoplasm of ovary: Secondary | ICD-10-CM | POA: Insufficient documentation

## 2016-03-21 DIAGNOSIS — D0512 Intraductal carcinoma in situ of left breast: Secondary | ICD-10-CM

## 2016-03-21 DIAGNOSIS — Z902 Acquired absence of lung [part of]: Secondary | ICD-10-CM | POA: Insufficient documentation

## 2016-03-21 DIAGNOSIS — Z923 Personal history of irradiation: Secondary | ICD-10-CM | POA: Insufficient documentation

## 2016-03-21 DIAGNOSIS — Z87891 Personal history of nicotine dependence: Secondary | ICD-10-CM | POA: Diagnosis not present

## 2016-03-21 DIAGNOSIS — Z79899 Other long term (current) drug therapy: Secondary | ICD-10-CM

## 2016-03-21 DIAGNOSIS — Z9223 Personal history of estrogen therapy: Secondary | ICD-10-CM | POA: Diagnosis not present

## 2016-03-21 DIAGNOSIS — Z803 Family history of malignant neoplasm of breast: Secondary | ICD-10-CM | POA: Insufficient documentation

## 2016-03-21 DIAGNOSIS — M1990 Unspecified osteoarthritis, unspecified site: Secondary | ICD-10-CM | POA: Insufficient documentation

## 2016-03-21 DIAGNOSIS — J449 Chronic obstructive pulmonary disease, unspecified: Secondary | ICD-10-CM | POA: Insufficient documentation

## 2016-03-21 DIAGNOSIS — Z1509 Genetic susceptibility to other malignant neoplasm: Secondary | ICD-10-CM

## 2016-03-21 MED ORDER — ANASTROZOLE 1 MG PO TABS: 1.0000 mg | ORAL_TABLET | Freq: Every day | ORAL | 3 refills | Status: DC

## 2016-03-21 NOTE — Progress Notes (Signed)
Patient here today for follow up.  Patient states no new concerns today  

## 2016-03-21 NOTE — Progress Notes (Signed)
[place] CONSULT NOTE  Patient Care Team: [facility] as PCP - General (General Practice) Seeplaputhur Robinette Haines, MD (General Surgery) Kennieth Francois, MD (Inactive) (Hematology and Oncology) Cammie Sickle, MD as Consulting Physician (Internal Medicine)  CHIEF COMPLAINTS/PURPOSE OF CONSULTATION:   Oncology History   #  June 2017- STAGE I [pT1a pN0] RUL Non-small cell Ca [favor adeno s/p ENB/FNA]; No adj therapy.   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  female patient diagnosed with adenocarcinoma the right lung status post lobectomy; and also history of DCIS; and history of BRCA 1 mutation deleterious is here for follow-up. She is currently not taking Tamoxifen due to side effects. She states that the hot flashes and bone pain were "unberable" so she stopped taking them. She states it has been over one year.   Patient complains of mild pain at the site of the surgery/right chest wall. Otherwise denies any fever or chills. Denies any shortness of breath. She admits to quitting smoking.  ROS: A complete 10 point review of system is done which is negative except mentioned above in history of present illness  MEDICAL HISTORY:  Past Medical History:  Diagnosis Date  . Allergy   . Arthritis   . Breast cancer ([place]) 2014   Left- Radiation  . COPD (chronic obstructive pulmonary disease) ([place])   . Coughing up blood   . GERD (gastroesophageal reflux disease)   . Hypertension   . Shortness of breath dyspnea   . Vitamin D deficiency     SURGICAL HISTORY: Past Surgical History:  Procedure  Laterality Date  . ABDOMINAL HYSTERECTOMY    . BREAST BIOPSY Left 2014   +  . BREAST EXCISIONAL BIOPSY Left 2014  . BREAST SURGERY Left 2014   lumpectomy  . COLONOSCOPY WITH PROPOFOL N/A 08/18/2015   Procedure: COLONOSCOPY WITH PROPOFOL;  Surgeon: Christene Lye, MD;  Location: [facility] ENDOSCOPY;  Service: Endoscopy;  Laterality: N/A;  . ELECTROMAGNETIC NAVIGATION BROCHOSCOPY Right 07/28/2015   Procedure: ELECTROMAGNETIC NAVIGATION BRONCHOSCOPY;  Surgeon: Flora Lipps, MD;  Location: [facility] ORS;  Service: Cardiopulmonary;  Laterality: Right;  . FOOT SURGERY    . FRACTURE SURGERY    . GANGLION CYST EXCISION    . THORACOTOMY/LOBECTOMY Right 09/14/2015   Procedure: THORACOTOMY/LOBECTOMY;  Surgeon: Christene Lye, MD;  Location: [facility] ORS;  Service: Thoracic;  Laterality: Right;    SOCIAL HISTORY: Social History   Social History  . Marital status: Divorced    Spouse name: N/A  . Number of children: N/A  . Years of education: N/A   Occupational History  . Not on file.   Social History Main Topics  . Smoking status: Former Smoker    Packs/day: 1.00    Years: 30.00    Types: Cigarettes    Quit date: 07/30/2015  . Smokeless tobacco: Never Used  . Alcohol use No  . Drug use: No  . Sexual activity: Not on file   Other Topics Concern  . Not on file   Social History Narrative  . No narrative on file  FAMILY HISTORY: Family History  Problem Relation Age of Onset  . Cancer Sister 68    breast  . Breast cancer Sister 54    30  . Cancer Sister 93    breast  . Cancer Other     breast  . Lung cancer Brother   . Hypertension Mother   . Diabetes Mellitus II Mother   . Cancer Mother     mets  . Cancer Father     long cancer    ALLERGIES:  is allergic to accupril [quinapril hcl] and percocet [oxycodone-acetaminophen].  MEDICATIONS:  Current Outpatient Prescriptions  Medication Sig Dispense Refill  . amLODipine (NORVASC) 10 MG tablet Take 10 mg by mouth daily  with lunch.     . cetirizine (ZYRTEC) 10 MG tablet Take 10 mg by mouth as needed for allergies.    Marland Kitchen losartan (COZAAR) 100 MG tablet Take 100 mg by mouth daily with lunch.     . metoprolol succinate (TOPROL-XL) 25 MG 24 hr tablet Take 25 mg by mouth daily with lunch.     . triamcinolone (NASACORT AQ) 55 MCG/ACT AERO nasal inhaler Place 2 sprays into the nose daily. 1 Inhaler 12  . umeclidinium-vilanterol (ANORO ELLIPTA) 62.5-25 MCG/INH AEPB Inhale 1 puff into the lungs daily. (Patient taking differently: Inhale 1 puff into the lungs daily with lunch. ) 60 each 5  . anastrozole (ARIMIDEX) 1 MG tablet Take 1 tablet (1 mg total) by mouth daily. 60 tablet 3   No current facility-administered medications for this visit.       Marland Kitchen  PHYSICAL EXAMINATION: ECOG PERFORMANCE STATUS: 0 - Asymptomatic  Vitals:   03/21/16 1213  BP: 106/71  Pulse: 75  Temp: 97.4 F (36.3 C)   Filed Weights   03/21/16 1213  Weight: 177 lb 6 oz (80.5 kg)    GENERAL: Well-nourished well-developed; Alert, no distress and comfortable.   Alone.  EYES: no pallor or icterus OROPHARYNX: no thrush or ulceration; good dentition  NECK: supple, no masses felt LYMPH:  no palpable lymphadenopathy in the cervical, axillary or inguinal regions LUNGS: clear to auscultation and  No wheeze or crackles HEART/CVS: regular rate & rhythm and no murmurs; No lower extremity edema ABDOMEN: abdomen soft, non-tender and normal bowel sounds Musculoskeletal:no cyanosis of digits and no clubbing  PSYCH: alert & oriented x 3 with fluent speech NEURO: no focal motor/sensory deficits SKIN:  no rashes or significant lesions   LABORATORY DATA:  I have reviewed the data as listed Lab Results  Component Value Date   WBC 13.5 (H) 09/15/2015   HGB 11.9 (L) 09/15/2015   HCT 34.3 (L) 09/15/2015   MCV 94.2 09/15/2015   PLT 260 09/15/2015    Recent Labs  08/13/15 1017  09/15/15 0434 09/16/15 0533 09/18/15 0412 09/21/15 0531  NA 143   --  138 136  --   --   K 4.7  --  3.3* 3.2* 3.4*  --   CL 103  --  108 102  --   --   CO2 26  --  26 26  --   --   GLUCOSE 104*  --  133* 151*  --   --   BUN 12  --  9 9  --   --   CREATININE 0.69  < > 0.50 0.46  --  0.47  CALCIUM 9.9  --  8.7* 8.6*  --   --   GFRNONAA 98  < > >60 >60  --  >  60  GFRAA 113  < > >60 >60  --  >60  PROT 6.9  --   --   --   --   --   ALBUMIN 4.4  --   --   --   --   --   AST 13  --   --   --   --   --   ALT 13  --   --   --   --   --   ALKPHOS 121*  --   --   --   --   --   BILITOT <0.2  --   --   --   --   --   < > = values in this interval not displayed.  RADIOGRAPHIC STUDIES: I have personally reviewed the radiological images as listed and agreed with the findings in the report. No results found.  ASSESSMENT & PLAN:   Primary cancer of right upper lobe of lung ([place]) # Right UL adeno ca s/p lobectomy- STAGE I- no adjuvant therapy.  # Left breast DCIS-status post lumpectomy followed by radiation. Last mammogram- June 2017 NED. She also is not currently on Tamoxifen due to hot flashes and joint pain. Offered to switch therapies and she has agreed to try Anastrozole 1 mg daily. Prescription called in. Pt not keen on MRI surveillance. Only agrees to annual surveillance mammograms. Discussed the potential mechanisms; and side effects of anastrazole.    #  BRCA-1 positive. Long discussion the patient regarding- risk of breast cancer and ovarian cancer. Given her intolerance to antihormone therapy; and a previous history of DCIS- prophylactic mastectomy bilateral is recomneded. At this time, she is not agreeable to prophylactic mastectomy.   # follow up in 6 weeks with MD and to see how she is tolerating Anastrozole. She does not need labs.   Faythe Casa, NP   Cammie Sickle, MD 03/22/2016 8:39 AM

## 2016-03-21 NOTE — Assessment & Plan Note (Addendum)
#  Right UL adeno ca s/p lobectomy- STAGE I- no adjuvant therapy.  # Left breast DCIS-status post lumpectomy followed by radiation. Last mammogram- June 2017 NED. She also is not currently on Tamoxifen due to hot flashes and joint pain. Offered to switch therapies and she has agreed to try Anastrozole 1 mg daily. Prescription called in. Pt not keen on MRI surveillance. Only agrees to annual surveillance mammograms. Discussed the potential mechanisms; and side effects of anastrazole.    #  BRCA-1 positive. Long discussion the patient regarding- risk of breast cancer and ovarian cancer. Given her intolerance to antihormone therapy; and a previous history of DCIS- prophylactic mastectomy bilateral is recomneded. At this time, she is not agreeable to prophylactic mastectomy.   # follow up in 6 weeks with MD and to see how she is tolerating Anastrozole. She does not need labs.

## 2016-03-22 DIAGNOSIS — D0512 Intraductal carcinoma in situ of left breast: Secondary | ICD-10-CM | POA: Insufficient documentation

## 2016-04-05 ENCOUNTER — Emergency Department
Admission: EM | Admit: 2016-04-05 | Discharge: 2016-04-05 | Disposition: A | Discharge status: Home / Self Care | Payer: BLUE CROSS/BLUE SHIELD | Attending: Emergency Medicine | Admitting: Emergency Medicine

## 2016-04-05 ENCOUNTER — Encounter: Payer: Self-pay | Admitting: Emergency Medicine

## 2016-04-05 DIAGNOSIS — Z853 Personal history of malignant neoplasm of breast: Secondary | ICD-10-CM | POA: Insufficient documentation

## 2016-04-05 DIAGNOSIS — J069 Acute upper respiratory infection, unspecified: Secondary | ICD-10-CM | POA: Diagnosis not present

## 2016-04-05 DIAGNOSIS — I10 Essential (primary) hypertension: Secondary | ICD-10-CM | POA: Insufficient documentation

## 2016-04-05 DIAGNOSIS — Z87891 Personal history of nicotine dependence: Secondary | ICD-10-CM | POA: Diagnosis not present

## 2016-04-05 DIAGNOSIS — J449 Chronic obstructive pulmonary disease, unspecified: Secondary | ICD-10-CM | POA: Diagnosis not present

## 2016-04-05 DIAGNOSIS — Z85118 Personal history of other malignant neoplasm of bronchus and lung: Secondary | ICD-10-CM | POA: Diagnosis not present

## 2016-04-05 DIAGNOSIS — B9789 Other viral agents as the cause of diseases classified elsewhere: Secondary | ICD-10-CM

## 2016-04-05 DIAGNOSIS — R05 Cough: Secondary | ICD-10-CM | POA: Diagnosis present

## 2016-04-05 MED ORDER — BENZONATATE 100 MG PO CAPS: ORAL_CAPSULE | ORAL | 0 refills | Status: DC

## 2016-04-05 NOTE — ED Triage Notes (Signed)
Patient ambulatory to triage with steady gait, without difficulty or distress noted; pt reports sinus congestion & prod cough clear sputum since Friday

## 2016-04-05 NOTE — ED Notes (Signed)
See triage note.states she developed sinus pressure and cough which started on Friday.Marland Kitchen Unsure of fever b/c she has been taking tylenol recently started on new meds .Marland Kitchen

## 2016-04-05 NOTE — Discharge Instructions (Signed)
Follow up with [facility] if any continued problems. Increase fluids. Continue Tylenol as needed. Take Tessalon 1 or 2 every 8 hours as needed for cough. You may also use saline nose spray for congestion.

## 2016-04-05 NOTE — ED Provider Notes (Signed)
[facility] Emergency Department Provider Note  ____________________________________________   First MD Initiated Contact with Patient 04/05/16 [phone]     (approximate)  I have reviewed the triage vital signs and the nursing notes.   HISTORY  Chief Complaint Nasal Congestion and Cough   HPI Connie Osborne is a 56 y.o. female is here with complaint of sinus pressure and cough which started approximately 3 days ago. Patient states she's not been taking any over-the-counter medication and denies fever. She has been taking Tylenol for the pressure. She also complains of a sore throat. 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 Patient states she did get the flu vaccine this year. She also has lung cancer and had surgery in July. She was placed on a hormone blocker on Friday and felt that this was the cause of her problems.   Past Medical History:  Diagnosis Date  . Allergy   . Arthritis   . Breast cancer ([place]) 2014   Left- Radiation  . COPD (chronic obstructive pulmonary disease) ([place])   . Coughing up blood   . GERD (gastroesophageal reflux disease)   . Hypertension   . Shortness of breath dyspnea   . Vitamin D deficiency     Patient Active Problem List   Diagnosis Date Noted  . 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 Primary cancer of right upper lobe of lung ([place]) 10/26/2015  . Cancer of lung ([place]) 09/14/2015  . Lung mass   . Solitary pulmonary nodule 07/13/2015  . Hemoptysis 06/30/2015  . BRCA gene positive 03/26/2013    Past Surgical History:  Procedure Laterality Date  . ABDOMINAL HYSTERECTOMY    . BREAST BIOPSY Left 2014   +  . BREAST EXCISIONAL BIOPSY Left 2014  . BREAST SURGERY Left 2014   lumpectomy  . COLONOSCOPY WITH PROPOFOL N/A 08/18/2015   Procedure: COLONOSCOPY WITH PROPOFOL;  Surgeon: Christene Lye, MD;  Location: [facility] ENDOSCOPY;  Service: Endoscopy;  Laterality: N/A;    . ELECTROMAGNETIC NAVIGATION BROCHOSCOPY Right 07/28/2015   Procedure: ELECTROMAGNETIC NAVIGATION BRONCHOSCOPY;  Surgeon: Flora Lipps, MD;  Location: [facility] ORS;  Service: Cardiopulmonary;  Laterality: Right;  . FOOT SURGERY    . FRACTURE SURGERY    . GANGLION CYST EXCISION    . THORACOTOMY/LOBECTOMY Right 09/14/2015   Procedure: THORACOTOMY/LOBECTOMY;  Surgeon: Christene Lye, MD;  Location: [facility] ORS;  Service: Thoracic;  Laterality: Right;    Prior to Admission medications   Medication Sig Start Date End Date Taking? Authorizing Provider  amLODipine (NORVASC) 10 MG tablet Take 10 mg by mouth daily with lunch.     Historical Provider, MD  anastrozole (ARIMIDEX) 1 MG tablet Take 1 tablet (1 mg total) by mouth daily. 03/21/16   Jacquelin Hawking, NP  benzonatate (TESSALON PERLES) 100 MG capsule Take 1 or 2 every 8 hours as needed for cough. 04/05/16   Johnn Hai, PA-C  cetirizine (ZYRTEC) 10 MG tablet Take 10 mg by mouth as needed for allergies.    Historical Provider, MD  losartan (COZAAR) 100 MG tablet Take 100 mg by mouth daily with lunch.     Historical Provider, MD  metoprolol succinate (TOPROL-XL) 25 MG 24 hr tablet Take 25 mg by mouth daily with lunch.     Historical Provider, MD  triamcinolone (NASACORT AQ) 55 MCG/ACT AERO nasal inhaler Place 2 sprays into the nose daily. 10/07/15   Wilhelmina Mcardle, MD  umeclidinium-vilanterol (ANORO ELLIPTA) 62.5-25 MCG/INH AEPB Inhale 1 puff into the lungs daily. Patient taking differently: Inhale 1 puff into the lungs daily with lunch.  08/04/15   Wilhelmina Mcardle, MD    Allergies Accupril [place] Canal hcl] and Percocet [oxycodone-acetaminophen]  Family History  Problem Relation Age of Onset  . Cancer Sister 37    breast  . Breast cancer Sister 24    30  . Cancer Sister 43    breast  . Cancer Other     breast  . Lung cancer Brother   . Hypertension Mother   . Diabetes Mellitus II Mother   . Cancer Mother     mets  . Cancer Father      long cancer    Social History Social History  Substance Use Topics  . Smoking status: Former Smoker    Packs/day: 1.00    Years: 30.00    Types: Cigarettes    Quit date: 07/30/2015  . Smokeless tobacco: Never Used  . Alcohol use No    Review of Systems Constitutional: No fever/chills Eyes: No visual changes. ENT: Positive sore throat. Positive nasal congestion. Positive for sinus pressure. Cardiovascular: Denies chest pain. Respiratory: Denies shortness of breath. Positive nonproductive cough. Gastrointestinal: No abdominal pain.  No nausea, no vomiting.  No diarrhea.   Musculoskeletal: Negative for back pain. Negative for muscle aches. Skin: Negative for rash. Neurological: Negative for headaches, focal weakness or numbness.  10-point ROS otherwise negative.  ____________________________________________   PHYSICAL EXAM:  VITAL SIGNS: ED Triage Vitals  Enc Vitals Group     BP 04/05/16 0353 (!) 161/78     Pulse Rate 04/05/16 0353 79     Resp 04/05/16 0353 18     Temp 04/05/16 0353 98.1 F (36.7 C)     Temp Source 04/05/16 0353 Oral     SpO2 04/05/16 0353 97 %     Weight 04/05/16 0351 177 lb (80.3 kg)     Height 04/05/16 0351 _0  (1.651 m)     Head Circumference --      Peak Flow --      Pain Score --      Pain Loc --      Pain Edu? --      Excl. in [place]? --     Constitutional: Alert and oriented. Well appearing and in no acute distress. Eyes: Conjunctivae are normal. PERRL. EOMI. Head: Atraumatic. Nose: Mild congestion/rhinnorhea. EACs are clear TMs are dull. Mouth/Throat: Mucous membranes are moist.  Oropharynx non-erythematous. Neck: No stridor.  Hematological/Lymphatic/Immunilogical: No cervical lymphadenopathy. Cardiovascular: Normal rate, regular rhythm. Grossly normal heart sounds.  Good peripheral circulation. Respiratory: Normal respiratory effort.  No retractions. Lungs CTAB. Gastrointestinal: Soft and nontender. No distention.  No CVA  tenderness. Musculoskeletal: No lower extremity tenderness nor edema.  No joint effusions. Neurologic:  Normal speech and language. No gross focal neurologic deficits are appreciated.  Skin:  Skin is warm, dry and intact. No rash noted. Psychiatric: Mood and affect are normal. Speech and behavior are normal.  ____________________________________________   LABS (all labs ordered are listed, but only abnormal results are displayed)  Labs Reviewed - No data to display  PROCEDURES  Procedure(s) performed: None  Procedures  Critical Care performed: No  ____________________________________________   INITIAL IMPRESSION / ASSESSMENT AND PLAN / ED COURSE  Pertinent labs & imaging results that were available during my care of the patient were reviewed by me and considered in my medical decision making (see chart for  details).  Patient is continue taking her hormone blocker until discussing this with her doctor. She is encouraged to use saline nose spray as needed for nasal congestion. She is also placed on Tessalon Perles 100 mg one or 2 every 8 hours as needed for cough. Patient is encouraged to increase fluids and also take Tylenol as needed for fever. She'll follow-up with her primary care doctor is [facility] if any continued problems.      ____________________________________________   FINAL CLINICAL IMPRESSION(S) / ED DIAGNOSES  Final diagnoses:  Viral URI with cough      NEW MEDICATIONS STARTED DURING THIS VISIT:  Discharge Medication List as of 04/05/2016  7:20 AM       Note:  This document was prepared using Dragon voice recognition software and may include unintentional dictation errors.    Johnn Hai, PA-C 04/05/16 2863    Lavonia Drafts, MD 04/05/16 [phone]

## 2016-05-02 ENCOUNTER — Inpatient Hospital Stay: Payer: BLUE CROSS/BLUE SHIELD | Admitting: Internal Medicine

## 2016-06-09 ENCOUNTER — Other Ambulatory Visit: Payer: Self-pay

## 2016-06-09 DIAGNOSIS — D0512 Intraductal carcinoma in situ of left breast: Secondary | ICD-10-CM

## 2016-07-11 ENCOUNTER — Encounter: Payer: Self-pay | Admitting: Pulmonary Disease

## 2016-07-11 ENCOUNTER — Ambulatory Visit: Payer: BLUE CROSS/BLUE SHIELD | Admitting: Pulmonary Disease

## 2016-08-23 ENCOUNTER — Ambulatory Visit
Admission: RE | Admit: 2016-08-23 | Discharge: 2016-08-23 | Disposition: A | Discharge status: Home / Self Care | Payer: BLUE CROSS/BLUE SHIELD | Source: Ambulatory Visit | Attending: General Surgery | Admitting: General Surgery

## 2016-08-23 DIAGNOSIS — R928 Other abnormal and inconclusive findings on diagnostic imaging of breast: Secondary | ICD-10-CM | POA: Insufficient documentation

## 2016-08-23 DIAGNOSIS — D0512 Intraductal carcinoma in situ of left breast: Secondary | ICD-10-CM | POA: Diagnosis not present

## 2016-08-23 HISTORY — DX: Personal history of irradiation: Z92.3

## 2016-09-06 ENCOUNTER — Ambulatory Visit: Payer: BLUE CROSS/BLUE SHIELD | Admitting: General Surgery

## 2016-09-19 ENCOUNTER — Encounter: Payer: Self-pay | Admitting: General Surgery

## 2016-09-19 ENCOUNTER — Ambulatory Visit (INDEPENDENT_AMBULATORY_CARE_PROVIDER_SITE_OTHER): Payer: BLUE CROSS/BLUE SHIELD | Admitting: General Surgery

## 2016-09-19 ENCOUNTER — Other Ambulatory Visit: Payer: Self-pay | Admitting: *Deleted

## 2016-09-19 VITALS — BP 126/70 | HR 72 | Resp 12 | Ht 65.0 in | Wt 183.0 lb

## 2016-09-19 DIAGNOSIS — D0512 Intraductal carcinoma in situ of left breast: Secondary | ICD-10-CM

## 2016-09-19 DIAGNOSIS — C3411 Malignant neoplasm of upper lobe, right bronchus or lung: Secondary | ICD-10-CM | POA: Diagnosis not present

## 2016-09-19 DIAGNOSIS — Z1509 Genetic susceptibility to other malignant neoplasm: Secondary | ICD-10-CM | POA: Diagnosis not present

## 2016-09-19 DIAGNOSIS — R053 Chronic cough: Secondary | ICD-10-CM

## 2016-09-19 DIAGNOSIS — Z1501 Genetic susceptibility to malignant neoplasm of breast: Secondary | ICD-10-CM

## 2016-09-19 DIAGNOSIS — R05 Cough: Secondary | ICD-10-CM

## 2016-09-19 DIAGNOSIS — Z86010 Personal history of colonic polyps: Secondary | ICD-10-CM

## 2016-09-19 MED ORDER — POLYETHYLENE GLYCOL 3350 17 GM/SCOOP PO POWD: ORAL | 0 refills | Status: DC

## 2016-09-19 NOTE — Patient Instructions (Signed)
Colonoscopy, Adult A colonoscopy is an exam to look at the entire large intestine. During the exam, a lubricated, bendable tube is inserted into the anus and then passed into the rectum, colon, and other parts of the large intestine. A colonoscopy is often done as a part of normal colorectal screening or in response to certain symptoms, such as anemia, persistent diarrhea, abdominal pain, and blood in the stool. The exam can help screen for and diagnose medical problems, including:  Tumors.  Polyps.  Inflammation.  Areas of bleeding.  Tell a health care provider about:  Any allergies you have.  All medicines you are taking, including vitamins, herbs, eye drops, creams, and over-the-counter medicines.  Any problems you or family members have had with anesthetic medicines.  Any blood disorders you have.  Any surgeries you have had.  Any medical conditions you have.  Any problems you have had passing stool. What are the risks? Generally, this is a safe procedure. However, problems may occur, including:  Bleeding.  A tear in the intestine.  A reaction to medicines given during the exam.  Infection (rare).  What happens before the procedure? Eating and drinking restrictions Follow instructions from your health care provider about eating and drinking, which may include:  A few days before the procedure - follow a low-fiber diet. Avoid nuts, seeds, dried fruit, raw fruits, and vegetables.  1-3 days before the procedure - follow a clear liquid diet. Drink only clear liquids, such as clear broth or bouillon, black coffee or tea, clear juice, clear soft drinks or sports drinks, gelatin dessert, and popsicles. Avoid any liquids that contain red or purple dye.  On the day of the procedure - do not eat or drink anything during the 2 hours before the procedure, or within the time period that your health care provider recommends.  Bowel prep If you were prescribed an oral bowel prep  to clean out your colon:  Take it as told by your health care provider. Starting the day before your procedure, you will need to drink a large amount of medicated liquid. The liquid will cause you to have multiple loose stools until your stool is almost clear or light green.  If your skin or anus gets irritated from diarrhea, you may use these to relieve the irritation: ? Medicated wipes, such as adult wet wipes with aloe and vitamin E. ? A skin soothing-product like petroleum jelly.  If you vomit while drinking the bowel prep, take a break for up to 60 minutes and then begin the bowel prep again. If vomiting continues and you cannot take the bowel prep without vomiting, call your health care provider.  General instructions  Ask your health care provider about changing or stopping your regular medicines. This is especially important if you are taking diabetes medicines or blood thinners.  Plan to have someone take you home from the hospital or clinic. What happens during the procedure?  An IV tube may be inserted into one of your veins.  You will be given medicine to help you relax (sedative).  To reduce your risk of infection: ? Your health care team will wash or sanitize their hands. ? Your anal area will be washed with soap.  You will be asked to lie on your side with your knees bent.  Your health care provider will lubricate a long, thin, flexible tube. The tube will have a camera and a light on the end.  The tube will be inserted into your   anus.  The tube will be gently eased through your rectum and colon.  Air will be delivered into your colon to keep it open. You may feel some pressure or cramping.  The camera will be used to take images during the procedure.  A small tissue sample may be removed from your body to be examined under a microscope (biopsy). If any potential problems are found, the tissue will be sent to a lab for testing.  If small polyps are found, your  health care provider may remove them and have them checked for cancer cells.  The tube that was inserted into your anus will be slowly removed. The procedure may vary among health care providers and hospitals. What happens after the procedure?  Your blood pressure, heart rate, breathing rate, and blood oxygen level will be monitored until the medicines you were given have worn off.  Do not drive for 24 hours after the exam.  You may have a small amount of blood in your stool.  You may pass gas and have mild abdominal cramping or bloating due to the air that was used to inflate your colon during the exam.  It is up to you to get the results of your procedure. Ask your health care provider, or the department performing the procedure, when your results will be ready. This information is not intended to replace advice given to you by your health care provider. Make sure you discuss any questions you have with your health care provider. Document Released: 02/12/2000 Document Revised: 12/16/2015 Document Reviewed: 04/28/2015 Elsevier Interactive Patient Education  2018 Elsevier Inc.  

## 2016-09-19 NOTE — Progress Notes (Signed)
This patient was sent to [facility] to have the following drawn today: CBC and Met C.   Patient has been scheduled for a CT chest with contrast at [facility] for 09-27-16 at 8:30 am (arrive 8:15 am). Prep: no solids 4 hours prior but patient may have clear liquids up until exam time. Patient verbalizes understanding.  This patient has been scheduled for a colonoscopy on 10-12-16 at [facility]. Miralax prescription has been sent in to the patient's pharmacy today. Colonoscopy instructions have been reviewed with the patient. This patient is aware to call the office if they have further questions.   Patient was also supposed to be scheduled for a breast MRI at the [facility] but is declining due to the fact that she is not familiar with [facility] and does not have anyone to take her to the appointment. Dr. [name] has been notified.   Patient will be placed in the recalls to follow up with Dr. [name] in December 2018.

## 2016-09-19 NOTE — Progress Notes (Signed)
Patient ID: Connie Osborne, female   DOB: [birth year], 56 y.o.   MRN: [id number]  Chief Complaint  Patient presents with  . Follow-up    HPI Connie Osborne is a 56 y.o. female who presents for a breast and lung cancer follow up.The most recent mammogram was done on 08/23/2016.  Patient does perform regular self breast checks and gets regular mammograms done.   Pt is complaining of ongoing cough that often is so severe it causes her to vomit. 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  When asked about antihormonal therapy, pt said she cannot tolerate anastrozole and is no longer taking it.    HPI  Past Medical History:  Diagnosis Date  . Allergy   . Arthritis   . Breast cancer ([place]) 2014   Left- Radiation; BRCA 1 +   . COPD (chronic obstructive pulmonary disease) ([place])   . Coughing up blood   . GERD (gastroesophageal reflux disease)   . Hypertension   . Lung cancer ([place])    Carcinoma, right upper lobe   . Personal history of radiation therapy   . Shortness of breath dyspnea   . Vitamin D deficiency     Past Surgical History:  Procedure Laterality Date  . ABDOMINAL HYSTERECTOMY    . BREAST BIOPSY Left 2014   +  . BREAST EXCISIONAL BIOPSY Left 2014  . BREAST LUMPECTOMY Left 2014  . BREAST SURGERY Left 2014   lumpectomy  . COLONOSCOPY WITH PROPOFOL N/A 08/18/2015   Procedure: COLONOSCOPY WITH PROPOFOL;  Surgeon: Christene Lye, MD;  Location: [facility] ENDOSCOPY;  Service: Endoscopy;  Laterality: N/A;  . ELECTROMAGNETIC NAVIGATION BROCHOSCOPY Right 07/28/2015   Procedure: ELECTROMAGNETIC NAVIGATION BRONCHOSCOPY;  Surgeon: Flora Lipps, MD;  Location: [facility] ORS;  Service: Cardiopulmonary;  Laterality: Right;  . FOOT SURGERY    . FRACTURE SURGERY    . GANGLION CYST EXCISION    . THORACOTOMY/LOBECTOMY Right 09/14/2015   Procedure: THORACOTOMY/LOBECTOMY;  Surgeon: Christene Lye, MD;  Location: [facility] ORS;  Service: Thoracic;  Laterality: Right;      Family History  Problem Relation Age of Onset  . Cancer Sister 75       breast  . Breast cancer Sister 19  . Cancer Sister 80       breast  . Breast cancer Sister 74  . Cancer Other        breast  . Lung cancer Brother   . Hypertension Mother   . Diabetes Mellitus II Mother   . Cancer Mother        mets  . Cancer Father        long cancer    Social History Social History  Substance Use Topics  . Smoking status: Former Smoker    Packs/day: 1.00    Years: 30.00    Types: Cigarettes    Quit date: 07/30/2015  . Smokeless tobacco: Never Used  . Alcohol use No    Allergies  Allergen Reactions  . Accupril [Quinapril Hcl] Cough  . Percocet [Oxycodone-Acetaminophen] Other (See Comments)    Reaction: Unknown    Current Outpatient Prescriptions  Medication Sig Dispense Refill  . amLODipine (NORVASC) 10 MG tablet Take 10 mg by mouth daily with lunch.     . cetirizine (ZYRTEC) 10 MG tablet Take 10 mg by mouth as needed for allergies.    Marland Kitchen losartan (COZAAR) 100 MG tablet Take 100 mg by mouth daily with lunch.     Marland Kitchen  metoprolol succinate (TOPROL-XL) 25 MG 24 hr tablet Take 25 mg by mouth daily with lunch.     . triamcinolone (NASACORT AQ) 55 MCG/ACT AERO nasal inhaler Place 2 sprays into the nose daily. 1 Inhaler 12  . polyethylene glycol powder (GLYCOLAX/MIRALAX) powder 255 grams one bottle for colonoscopy prep 255 g 0   No current facility-administered medications for this visit.     Review of Systems Review of Systems  Constitutional: Negative.   Respiratory: Positive for cough. Negative for choking, chest tightness, shortness of breath and wheezing.   Cardiovascular: Negative.     Blood pressure 126/70, pulse 72, resp. rate 12, height _0  (1.651 m), weight 183 lb (83 kg).  Physical Exam Physical Exam  Constitutional: She is oriented to person, place, and time. She appears well-developed and well-nourished.  Eyes: Conjunctivae are normal. No scleral icterus.   Neck: Neck supple.  Cardiovascular: Normal rate, regular rhythm and normal heart sounds.   Pulmonary/Chest: Effort normal and breath sounds normal. Right breast exhibits no inverted nipple, no mass, no nipple discharge, no skin change and no tenderness. Left breast exhibits no inverted nipple, no mass, no nipple discharge, no skin change and no tenderness. Breasts are symmetrical.    Abdominal: Soft. Normal appearance and bowel sounds are normal. There is no hepatomegaly. There is no tenderness. No hernia.  Lymphadenopathy:    She has no cervical adenopathy.    She has no axillary adenopathy.  Neurological: She is alert and oriented to person, place, and time.  Skin: Skin is warm and dry.    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 Considerable discussion on this. CT scan chest-  1 year f/u lobectomy  Colonoscopy- surveillance for large sigmoid colon polyp with dysplasia   Pt not willing to consider antihormonal therapy. Not interested in prophylactic mastectomy/oophorecomy Recommended MRI of left breast - pt was agreeable but later declined since she has to get it done in [place]-has no transportaion  Colonoscopy with possible biopsy/polypectomy prn: Information regarding the procedure, including its potential risks and complications (including but not limited to perforation of the bowel, which may require emergency surgery to repair, and bleeding) was verbally given to the patient. Educational information regarding lower  intestinal endoscopy was given to the patient. Written instructions for how to complete the bowel prep using Miralax were provided. The importance of drinking ample fluids to avoid dehydration as a result of the prep emphasized.     HPI, Physical Exam, Assessment and Plan have been scribed under the direction and in the presence of Mckinley Jewel, MD  Gaspar Cola, CMA     I have completed the exam and reviewed the above documentation for accuracy and completeness.  I agree with the above.  Haematologist has been used and any errors in dictation or transcription are unintentional.  Seeplaputhur G. [name], M.D., F.A.C.S.     Seeplaputhur G. [name], M.D., F.A.C.S.   Junie Panning G 09/19/2016, 10:21 AM

## 2016-09-20 ENCOUNTER — Telehealth: Payer: Self-pay

## 2016-09-20 LAB — CBC WITH DIFFERENTIAL/PLATELET
BASOS ABS: 0.1 10*3/uL (ref 0.0–0.2)
Basos: 1 %
EOS (ABSOLUTE): 0.2 10*3/uL (ref 0.0–0.4)
Eos: 3 %
Hematocrit: 39.2 % (ref 34.0–46.6)
Hemoglobin: 13.1 g/dL (ref 11.1–15.9)
IMMATURE GRANULOCYTES: 0 %
Immature Grans (Abs): 0 10*3/uL (ref 0.0–0.1)
LYMPHS ABS: 2.5 10*3/uL (ref 0.7–3.1)
Lymphs: 39 %
MCH: 30.2 pg (ref 26.6–33.0)
MCHC: 33.4 g/dL (ref 31.5–35.7)
MCV: 90 fL (ref 79–97)
MONOCYTES: 8 %
Monocytes Absolute: 0.5 10*3/uL (ref 0.1–0.9)
NEUTROS PCT: 49 %
Neutrophils Absolute: 3.2 10*3/uL (ref 1.4–7.0)
Platelets: 336 10*3/uL (ref 150–379)
RBC: 4.34 x10E6/uL (ref 3.77–5.28)
RDW: 13.7 % (ref 12.3–15.4)
WBC: 6.3 10*3/uL (ref 3.4–10.8)

## 2016-09-20 LAB — COMPREHENSIVE METABOLIC PANEL
ALBUMIN: 4.4 g/dL (ref 3.5–5.5)
ALT: 31 IU/L (ref 0–32)
AST: 28 IU/L (ref 0–40)
Albumin/Globulin Ratio: 1.6 (ref 1.2–2.2)
Alkaline Phosphatase: 131 IU/L — ABNORMAL HIGH (ref 39–117)
BUN/Creatinine Ratio: 15 (ref 9–23)
BUN: 10 mg/dL (ref 6–24)
Bilirubin Total: 0.3 mg/dL (ref 0.0–1.2)
CALCIUM: 9.4 mg/dL (ref 8.7–10.2)
CO2: 25 mmol/L (ref 20–29)
CREATININE: 0.66 mg/dL (ref 0.57–1.00)
Chloride: 106 mmol/L (ref 96–106)
GFR calc Af Amer: 114 mL/min/{1.73_m2} (ref >59)
GFR, EST NON AFRICAN AMERICAN: 99 mL/min/{1.73_m2} (ref >59)
GLUCOSE: 104 mg/dL — AB (ref 65–99)
Globulin, Total: 2.7 g/dL (ref 1.5–4.5)
Potassium: 4.4 mmol/L (ref 3.5–5.2)
Sodium: 144 mmol/L (ref 134–144)
Total Protein: 7.1 g/dL (ref 6.0–8.5)

## 2016-09-20 NOTE — Telephone Encounter (Signed)
-----   Message from Christene Lye, MD sent at 09/20/2016  7:13 AM EDT ----- Inform pt, labs are normal

## 2016-09-20 NOTE — Telephone Encounter (Signed)
Notified patient as instructed, patient pleased. Discussed follow-up appointments, patient agrees  

## 2016-09-27 ENCOUNTER — Ambulatory Visit
Admission: RE | Admit: 2016-09-27 | Discharge: 2016-09-27 | Disposition: A | Discharge status: Home / Self Care | Payer: BLUE CROSS/BLUE SHIELD | Source: Ambulatory Visit | Attending: General Surgery | Admitting: General Surgery

## 2016-09-27 DIAGNOSIS — Z902 Acquired absence of lung [part of]: Secondary | ICD-10-CM | POA: Insufficient documentation

## 2016-09-27 DIAGNOSIS — R053 Chronic cough: Secondary | ICD-10-CM

## 2016-09-27 DIAGNOSIS — R05 Cough: Secondary | ICD-10-CM | POA: Diagnosis not present

## 2016-09-27 DIAGNOSIS — E042 Nontoxic multinodular goiter: Secondary | ICD-10-CM | POA: Diagnosis not present

## 2016-09-27 DIAGNOSIS — J439 Emphysema, unspecified: Secondary | ICD-10-CM | POA: Diagnosis not present

## 2016-09-27 DIAGNOSIS — I700 Atherosclerosis of aorta: Secondary | ICD-10-CM | POA: Diagnosis not present

## 2016-09-27 DIAGNOSIS — C3411 Malignant neoplasm of upper lobe, right bronchus or lung: Secondary | ICD-10-CM | POA: Diagnosis not present

## 2016-09-27 MED ORDER — IOPAMIDOL (ISOVUE-300) INJECTION 61%
75.0000 mL | Freq: Once | INTRAVENOUS | Status: AC | PRN
  Administered 2016-09-27: 75 mL via INTRAVENOUS

## 2016-09-28 ENCOUNTER — Telehealth: Payer: Self-pay | Admitting: *Deleted

## 2016-09-28 NOTE — Telephone Encounter (Signed)
Notified patient as instructed, patient pleased. Discussed follow-up appointments, patient agrees  

## 2016-09-28 NOTE — Telephone Encounter (Signed)
-----   Message from Christene Lye, MD sent at 09/28/2016  7:51 AM EDT ----- Please inform pt CT showed no findings of concern

## 2016-10-04 ENCOUNTER — Other Ambulatory Visit: Payer: Self-pay | Admitting: General Surgery

## 2016-10-04 DIAGNOSIS — Z86010 Personal history of colonic polyps: Secondary | ICD-10-CM

## 2016-10-10 ENCOUNTER — Other Ambulatory Visit: Payer: Self-pay

## 2016-10-10 MED ORDER — POLYETHYLENE GLYCOL 3350 17 GM/SCOOP PO POWD
1.0000 | Freq: Once | ORAL | 0 refills | Status: AC
Start: 2016-10-10 — End: 2016-10-10

## 2016-10-12 ENCOUNTER — Encounter
Admission: RE | Disposition: A | Discharge status: Home / Self Care | Payer: Self-pay | Source: Ambulatory Visit | Attending: General Surgery

## 2016-10-12 ENCOUNTER — Ambulatory Visit: Payer: BLUE CROSS/BLUE SHIELD | Admitting: Anesthesiology

## 2016-10-12 ENCOUNTER — Encounter: Payer: Self-pay | Admitting: Anesthesiology

## 2016-10-12 ENCOUNTER — Ambulatory Visit
Admission: RE | Admit: 2016-10-12 | Discharge: 2016-10-12 | Disposition: A | Discharge status: Home / Self Care | Payer: BLUE CROSS/BLUE SHIELD | Source: Ambulatory Visit | Attending: General Surgery | Admitting: General Surgery

## 2016-10-12 DIAGNOSIS — Z8249 Family history of ischemic heart disease and other diseases of the circulatory system: Secondary | ICD-10-CM | POA: Diagnosis not present

## 2016-10-12 DIAGNOSIS — Z923 Personal history of irradiation: Secondary | ICD-10-CM | POA: Diagnosis not present

## 2016-10-12 DIAGNOSIS — Z888 Allergy status to other drugs, medicaments and biological substances status: Secondary | ICD-10-CM | POA: Insufficient documentation

## 2016-10-12 DIAGNOSIS — Z833 Family history of diabetes mellitus: Secondary | ICD-10-CM | POA: Diagnosis not present

## 2016-10-12 DIAGNOSIS — Z853 Personal history of malignant neoplasm of breast: Secondary | ICD-10-CM | POA: Diagnosis not present

## 2016-10-12 DIAGNOSIS — K219 Gastro-esophageal reflux disease without esophagitis: Secondary | ICD-10-CM | POA: Insufficient documentation

## 2016-10-12 DIAGNOSIS — Z803 Family history of malignant neoplasm of breast: Secondary | ICD-10-CM | POA: Insufficient documentation

## 2016-10-12 DIAGNOSIS — Z87891 Personal history of nicotine dependence: Secondary | ICD-10-CM | POA: Diagnosis not present

## 2016-10-12 DIAGNOSIS — I10 Essential (primary) hypertension: Secondary | ICD-10-CM | POA: Diagnosis not present

## 2016-10-12 DIAGNOSIS — Z79899 Other long term (current) drug therapy: Secondary | ICD-10-CM | POA: Diagnosis not present

## 2016-10-12 DIAGNOSIS — Z885 Allergy status to narcotic agent status: Secondary | ICD-10-CM | POA: Insufficient documentation

## 2016-10-12 DIAGNOSIS — Z1211 Encounter for screening for malignant neoplasm of colon: Secondary | ICD-10-CM | POA: Diagnosis present

## 2016-10-12 DIAGNOSIS — Z86010 Personal history of colonic polyps: Secondary | ICD-10-CM | POA: Insufficient documentation

## 2016-10-12 DIAGNOSIS — J449 Chronic obstructive pulmonary disease, unspecified: Secondary | ICD-10-CM | POA: Diagnosis not present

## 2016-10-12 DIAGNOSIS — Z85118 Personal history of other malignant neoplasm of bronchus and lung: Secondary | ICD-10-CM | POA: Insufficient documentation

## 2016-10-12 DIAGNOSIS — Z801 Family history of malignant neoplasm of trachea, bronchus and lung: Secondary | ICD-10-CM | POA: Insufficient documentation

## 2016-10-12 HISTORY — PX: COLONOSCOPY WITH PROPOFOL: SHX5780

## 2016-10-12 SURGERY — COLONOSCOPY WITH PROPOFOL
Anesthesia: General

## 2016-10-12 MED ORDER — FENTANYL CITRATE (PF) 100 MCG/2ML IJ SOLN
INTRAMUSCULAR | Status: AC
  Filled 2016-10-12: qty 2

## 2016-10-12 MED ORDER — PROPOFOL 500 MG/50ML IV EMUL
INTRAVENOUS | Status: AC
  Filled 2016-10-12: qty 50

## 2016-10-12 MED ORDER — MIDAZOLAM HCL 2 MG/2ML IJ SOLN
INTRAMUSCULAR | Status: AC
  Filled 2016-10-12: qty 2

## 2016-10-12 MED ORDER — SODIUM CHLORIDE 0.45 % IV SOLN
INTRAVENOUS | Status: DC
  Administered 2016-10-12: 1000 mL via INTRAVENOUS

## 2016-10-12 MED ORDER — MIDAZOLAM HCL 2 MG/2ML IJ SOLN
INTRAMUSCULAR | Status: DC | PRN
  Administered 2016-10-12: 2 mg via INTRAVENOUS

## 2016-10-12 MED ORDER — PROPOFOL 500 MG/50ML IV EMUL
INTRAVENOUS | Status: DC | PRN
  Administered 2016-10-12: 120 ug/kg/min via INTRAVENOUS

## 2016-10-12 MED ORDER — FENTANYL CITRATE (PF) 100 MCG/2ML IJ SOLN
INTRAMUSCULAR | Status: DC | PRN
  Administered 2016-10-12: 50 ug via INTRAVENOUS
  Administered 2016-10-12 (×2): 25 ug via INTRAVENOUS

## 2016-10-12 NOTE — Anesthesia Preprocedure Evaluation (Signed)
Anesthesia Evaluation  Patient identified by MRN, date of birth, ID band Patient awake    Reviewed: Allergy & Precautions, NPO status , Patient's Chart, lab work & pertinent test results  History of Anesthesia Complications Negative for: history of anesthetic complications  Airway Mallampati: II       Dental  (+) Missing   Pulmonary COPD, former smoker,           Cardiovascular hypertension, Pt. on medications and Pt. on home beta blockers      Neuro/Psych neg Seizures    GI/Hepatic Neg liver ROS, GERD  Poorly Controlled,  Endo/Other  neg diabetes  Renal/GU negative Renal ROS     Musculoskeletal   Abdominal   Peds  Hematology   Anesthesia Other Findings   Reproductive/Obstetrics                             Anesthesia Physical Anesthesia Plan  ASA: II  Anesthesia Plan: General   Post-op Pain Management:    Induction: Intravenous  PONV Risk Score and Plan:   Airway Management Planned: Nasal Cannula  Additional Equipment:   Intra-op Plan:   Post-operative Plan:   Informed Consent: I have reviewed the patients History and Physical, chart, labs and discussed the procedure including the risks, benefits and alternatives for the proposed anesthesia with the patient or authorized representative who has indicated his/her understanding and acceptance.     Plan Discussed with:   Anesthesia Plan Comments:         Anesthesia Quick Evaluation

## 2016-10-12 NOTE — Anesthesia Postprocedure Evaluation (Signed)
Anesthesia Post Note  Patient: Connie Osborne  Procedure(s) Performed: Procedure(s) (LRB): COLONOSCOPY WITH PROPOFOL (N/A)  Patient location during evaluation: Endoscopy Anesthesia Type: General Level of consciousness: awake and alert Pain management: pain level controlled Vital Signs Assessment: post-procedure vital signs reviewed and stable Respiratory status: spontaneous breathing and respiratory function stable Cardiovascular status: stable Anesthetic complications: no     Last Vitals:  Vitals:   10/12/16 1021 10/12/16 1251  BP: 140/80 (!) 92/57  Pulse: 82 68  Resp: 16 13  Temp: 36.7 C (!) 35.6 C  SpO2: 98% 100%    Last Pain:  Vitals:   10/12/16 1251  TempSrc: Tympanic                 Lind Ausley K

## 2016-10-12 NOTE — Interval H&P Note (Signed)
History and Physical Interval Note:  10/12/2016 11:18 AM  Connie Osborne  has presented today for surgery, with the diagnosis of H/O COLON POLYP  The various methods of treatment have been discussed with the patient and family. After consideration of risks, benefits and other options for treatment, the patient has consented to  Procedure(s): COLONOSCOPY WITH PROPOFOL (N/A) as a surgical intervention .  The patient's history has been reviewed, patient examined, no change in status, stable for surgery.  I have reviewed the patient's chart and labs.  Questions were answered to the patient's satisfaction.     SANKAR,SEEPLAPUTHUR G

## 2016-10-12 NOTE — Transfer of Care (Signed)
Immediate Anesthesia Transfer of Care Note  Patient: Connie Osborne  Procedure(s) Performed: Procedure(s): COLONOSCOPY WITH PROPOFOL (N/A)  Patient Location: PACU  Anesthesia Type:General  Level of Consciousness: awake and sedated  Airway & Oxygen Therapy: Patient Spontanous Breathing and Patient connected to nasal cannula oxygen  Post-op Assessment: Report given to RN and Post -op Vital signs reviewed and stable  Post vital signs: Reviewed and stable  Last Vitals:  Vitals:   10/12/16 1021  BP: 140/80  Pulse: 82  Resp: 16  Temp: 36.7 C  SpO2: 98%    Last Pain:  Vitals:   10/12/16 1021  TempSrc: Tympanic         Complications: No apparent anesthesia complications

## 2016-10-12 NOTE — Anesthesia Post-op Follow-up Note (Signed)
Anesthesia QCDR form completed.        

## 2016-10-12 NOTE — H&P (View-Only) (Signed)
Patient ID: Connie Osborne, female   DOB: [birth year], 56 y.o.   MRN: [id number]  Chief Complaint  Patient presents with  . Follow-up    HPI Connie Osborne is a 56 y.o. female who presents for a breast and lung cancer follow up.The most recent mammogram was done on 08/23/2016.  Patient does perform regular self breast checks and gets regular mammograms done.   Pt is complaining of ongoing cough that often is so severe it causes her to vomit. 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  When asked about antihormonal therapy, pt said she cannot tolerate anastrozole and is no longer taking it.    HPI  Past Medical History:  Diagnosis Date  . Allergy   . Arthritis   . Breast cancer ([place]) 2014   Left- Radiation; BRCA 1 +   . COPD (chronic obstructive pulmonary disease) ([place])   . Coughing up blood   . GERD (gastroesophageal reflux disease)   . Hypertension   . Lung cancer ([place])    Carcinoma, right upper lobe   . Personal history of radiation therapy   . Shortness of breath dyspnea   . Vitamin D deficiency     Past Surgical History:  Procedure Laterality Date  . ABDOMINAL HYSTERECTOMY    . BREAST BIOPSY Left 2014   +  . BREAST EXCISIONAL BIOPSY Left 2014  . BREAST LUMPECTOMY Left 2014  . BREAST SURGERY Left 2014   lumpectomy  . COLONOSCOPY WITH PROPOFOL N/A 08/18/2015   Procedure: COLONOSCOPY WITH PROPOFOL;  Surgeon: Christene Lye, MD;  Location: [facility] ENDOSCOPY;  Service: Endoscopy;  Laterality: N/A;  . ELECTROMAGNETIC NAVIGATION BROCHOSCOPY Right 07/28/2015   Procedure: ELECTROMAGNETIC NAVIGATION BRONCHOSCOPY;  Surgeon: Flora Lipps, MD;  Location: [facility] ORS;  Service: Cardiopulmonary;  Laterality: Right;  . FOOT SURGERY    . FRACTURE SURGERY    . GANGLION CYST EXCISION    . THORACOTOMY/LOBECTOMY Right 09/14/2015   Procedure: THORACOTOMY/LOBECTOMY;  Surgeon: Christene Lye, MD;  Location: [facility] ORS;  Service: Thoracic;  Laterality: Right;      Family History  Problem Relation Age of Onset  . Cancer Sister 75       breast  . Breast cancer Sister 19  . Cancer Sister 80       breast  . Breast cancer Sister 74  . Cancer Other        breast  . Lung cancer Brother   . Hypertension Mother   . Diabetes Mellitus II Mother   . Cancer Mother        mets  . Cancer Father        long cancer    Social History Social History  Substance Use Topics  . Smoking status: Former Smoker    Packs/day: 1.00    Years: 30.00    Types: Cigarettes    Quit date: 07/30/2015  . Smokeless tobacco: Never Used  . Alcohol use No    Allergies  Allergen Reactions  . Accupril [Quinapril Hcl] Cough  . Percocet [Oxycodone-Acetaminophen] Other (See Comments)    Reaction: Unknown    Current Outpatient Prescriptions  Medication Sig Dispense Refill  . amLODipine (NORVASC) 10 MG tablet Take 10 mg by mouth daily with lunch.     . cetirizine (ZYRTEC) 10 MG tablet Take 10 mg by mouth as needed for allergies.    Marland Kitchen losartan (COZAAR) 100 MG tablet Take 100 mg by mouth daily with lunch.     Marland Kitchen  metoprolol succinate (TOPROL-XL) 25 MG 24 hr tablet Take 25 mg by mouth daily with lunch.     . triamcinolone (NASACORT AQ) 55 MCG/ACT AERO nasal inhaler Place 2 sprays into the nose daily. 1 Inhaler 12  . polyethylene glycol powder (GLYCOLAX/MIRALAX) powder 255 grams one bottle for colonoscopy prep 255 Osborne 0   No current facility-administered medications for this visit.     Review of Systems Review of Systems  Constitutional: Negative.   Respiratory: Positive for cough. Negative for choking, chest tightness, shortness of breath and wheezing.   Cardiovascular: Negative.     Blood pressure 126/70, pulse 72, resp. rate 12, height _0  (1.651 m), weight 183 lb (83 kg).  Physical Exam Physical Exam  Constitutional: She is oriented to person, place, and time. She appears well-developed and well-nourished.  Eyes: Conjunctivae are normal. No scleral icterus.   Neck: Neck supple.  Cardiovascular: Normal rate, regular rhythm and normal heart sounds.   Pulmonary/Chest: Effort normal and breath sounds normal. Right breast exhibits no inverted nipple, no mass, no nipple discharge, no skin change and no tenderness. Left breast exhibits no inverted nipple, no mass, no nipple discharge, no skin change and no tenderness. Breasts are symmetrical.    Abdominal: Soft. Normal appearance and bowel sounds are normal. There is no hepatomegaly. There is no tenderness. No hernia.  Lymphadenopathy:    She has no cervical adenopathy.    She has no axillary adenopathy.  Neurological: She is alert and oriented to person, place, and time.  Skin: Skin is warm and dry.    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 Considerable discussion on this. CT scan chest-  1 year f/u lobectomy  Colonoscopy- surveillance for large sigmoid colon polyp with dysplasia   Pt not willing to consider antihormonal therapy. Not interested in prophylactic mastectomy/oophorecomy Recommended MRI of left breast - pt was agreeable but later declined since she has to get it done in [place]-has no transportaion  Colonoscopy with possible biopsy/polypectomy prn: Information regarding the procedure, including its potential risks and complications (including but not limited to perforation of the bowel, which may require emergency surgery to repair, and bleeding) was verbally given to the patient. Educational information regarding lower  intestinal endoscopy was given to the patient. Written instructions for how to complete the bowel prep using Miralax were provided. The importance of drinking ample fluids to avoid dehydration as a result of the prep emphasized.     HPI, Physical Exam, Assessment and Plan have been scribed under the direction and in the presence of Connie Jewel, MD  Connie Osborne, CMA     I have completed the exam and reviewed the above documentation for accuracy and completeness.  I agree with the above.  Connie Osborne has been used and any errors in dictation or transcription are unintentional.  Connie Osborne, M.D., F.A.C.S.     Connie Osborne, M.D., F.A.C.S.   Connie Osborne 09/19/2016, 10:21 AM

## 2016-10-12 NOTE — Op Note (Signed)
[facility] Patient Name: Connie Osborne Procedure Date: 10/12/2016 12:08 PM MRN: [id number] Account #: [id number] Date of Birth: February 22, 1961 Admit Type: Outpatient Age: 56 Room: [facility] ENDO ROOM 1 Gender: Female Note Status: Finalized Procedure:            Colonoscopy Providers:            Lamisha Roussell G. [name], MD Referring MD:         (Referring MD), Marguerita Merles, MD (Referring MD) Medicines:            Total IV Anesthesia (TIVA) Complications:        No immediate complications. Procedure:            Pre-Anesthesia Assessment:                       - Prior to the procedure, a History and Physical was                        performed, and patient medications and allergies were                        reviewed. The patient is competent. The risks and                        benefits of the procedure and the sedation options and                        risks were discussed with the patient. All questions                        were answered and informed consent was obtained.                        Patient identification and proposed procedure were                        verified by the physician in the pre-procedure area.                        Mental Status Examination: alert and oriented. Airway                        Examination: normal oropharyngeal airway and neck                        mobility. Respiratory Examination: clear to                        auscultation. CV Examination: normal. Prophylactic                        Antibiotics: The patient does not require prophylactic                        antibiotics. Prior Anticoagulants: The patient has                        taken no previous anticoagulant or antiplatelet agents.                        ASA  Grade Assessment: II - A patient with mild systemic                        disease. After reviewing the risks and benefits, the                        patient was deemed in satisfactory condition  to undergo                        the procedure. The anesthesia plan was to use general                        anesthesia. Immediately prior to administration of                        medications, the patient was re-assessed for adequacy                        to receive sedatives. The heart rate, respiratory rate,                        oxygen saturations, blood pressure, adequacy of                        pulmonary ventilation, and response to care were                        monitored throughout the procedure. The physical status                        of the patient was re-assessed after the procedure.                       After obtaining informed consent, the colonoscope was                        passed under direct vision. Throughout the procedure,                        the patient's blood pressure, pulse, and oxygen                        saturations were monitored continuously. The                        Colonoscope was introduced through the anus and                        advanced to the the sigmoid colon to examine a                        polypectomy site. This was the intended extent. The                        colonoscopy was unusually difficult. Unable to access                        the descending colon in spite multiple pt position  changes and abd pressure. The patient tolerated the                        procedure well. Findings:      The perianal and digital rectal examinations were normal.      The entire examined colon appeared normal. Impression:           - The entire examined colon is normal.                       - No specimens collected. Recommendation:       - Discharge patient to home.                       - Resume previous diet.                       - Continue present medications.                       - Repeat colonoscopy in 3 years for surveillance. Procedure Code(s):    --- Professional ---                       [phone], 37,  Colonoscopy, flexible; diagnostic, including                        collection of specimen(s) by brushing or washing, when                        performed (separate procedure) CPT copyright 2016 [employer]. All rights reserved. The codes documented in this report are preliminary and upon coder review may  be revised to meet current compliance requirements. Christene Lye, MD 10/12/2016 12:54:14 PM This report has been signed electronically. Number of Addenda: 0 Note Initiated On: 10/12/2016 12:08 PM Total Procedure Duration: 0 hours 23 minutes 32 seconds       [facility]

## 2016-10-12 NOTE — Anesthesia Procedure Notes (Signed)
Performed by: COOK-MARTIN, Sela Falk Pre-anesthesia Checklist: Patient identified, Emergency Drugs available, Suction available, Patient being monitored and Timeout performed Patient Re-evaluated:Patient Re-evaluated prior to induction Oxygen Delivery Method: Nasal cannula Preoxygenation: Pre-oxygenation with 100% oxygen Induction Type: IV induction Placement Confirmation: CO2 detector and positive ETCO2       

## 2016-10-13 ENCOUNTER — Encounter: Payer: Self-pay | Admitting: General Surgery

## 2017-02-01 ENCOUNTER — Ambulatory Visit: Payer: BLUE CROSS/BLUE SHIELD | Admitting: General Surgery

## 2017-02-08 ENCOUNTER — Encounter: Payer: Self-pay | Admitting: *Deleted

## 2017-05-11 ENCOUNTER — Ambulatory Visit: Payer: BLUE CROSS/BLUE SHIELD | Admitting: General Surgery

## 2017-09-12 ENCOUNTER — Ambulatory Visit: Payer: BLUE CROSS/BLUE SHIELD | Admitting: General Surgery

## 2017-11-02 ENCOUNTER — Encounter: Payer: Self-pay | Admitting: *Deleted

## 2018-01-04 ENCOUNTER — Other Ambulatory Visit (HOSPITAL_COMMUNITY): Payer: Self-pay | Admitting: Nurse Practitioner

## 2018-01-04 DIAGNOSIS — Z1231 Encounter for screening mammogram for malignant neoplasm of breast: Secondary | ICD-10-CM

## 2018-05-17 IMAGING — CT NM PET TUM IMG INITIAL (PI) SKULL BASE T - THIGH
1 of 10 series · 1 of 25 positions shown · non-contrast
Comparison: Chest CT 06/30/2015.

CLINICAL DATA: Initial treatment strategy for right upper lobe
adenocarcinoma. Staging examination.

EXAM:
NUCLEAR MEDICINE PET SKULL BASE TO THIGH
TECHNIQUE: 12.04 mCi F-18 FDG was injected intravenously. Full-ring PET imaging
was performed from the skull base to thigh after the radiotracer. CT
data was obtained and used for attenuation correction and anatomic
localization.
FASTING BLOOD GLUCOSE:  Value: 93 mg/dl

[Series 4: pet wb (ac) · axial · 5.0mm · 4.07mm/px · 1 of 251 slices shown]
[im 126/251]
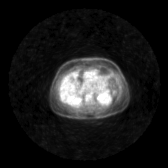

[1 of 25 positions shown; findings below may reference images not displayed]

FINDINGS: NECK

No hypermetabolic lymph nodes in the neck.

CHEST

In the right upper lobe there is again a ill-defined mass-like area
of ground-glass attenuation and extensive septal thickening which
measures up to 2.7 x 4.1 cm (axial image 58 of series 3), and has
within it a 1 cm focus that is more solid in appearance (axial image
58 of series 3). This entire region demonstrates mild
hypermetabolism, particularly the central nodular focus (SUVmax =
4.0). No other suspicious appearing pulmonary nodules or masses are
noted. Some irregular septal thickening is noted in the periphery of
the right upper lobe, which could reflect rather widespread lepidic
growth of this lesion. There is a nonenlarged low right paratracheal
lymph node measuring 9 mm in short axis which demonstrates some
low-level metabolic activity (SUVmax = 3.4), concerning for
metastasis. No other hilar or mediastinal hypermetabolic
lymphadenopathy is noted. Mild diffuse bronchial wall thickening
with mild centrilobular and paraseptal emphysema. No acute
consolidative airspace disease. No pleural effusions. 2.1 x 2.1 cm
nodular area in the left breast (image 78 of series 3) demonstrates
hypermetabolism (SUVmax = 2.9).

ABDOMEN/PELVIS

No abnormal hypermetabolic activity within the liver, pancreas,
adrenal glands, or spleen. No hypermetabolic lymph nodes in the
abdomen or pelvis. 3 mm nonobstructive calculus in the lower pole
collecting system of the right kidney. In the region of the proximal
sigmoid colon there is a 2 cm rounded soft tissue attenuation lesion
(image 205 of series 3) which demonstrates hypermetabolism (SUVmax =
10.9), concerning for primary colonic neoplasm. No pathologic
dilatation of small bowel or colon. Normal appendix. Atherosclerosis
throughout the abdominal and pelvic vasculature, without evidence of
aneurysm. No significant volume of ascites. No pneumoperitoneum.

SKELETON

No focal hypermetabolic activity to suggest skeletal metastasis.
IMPRESSION: 1. Hypermetabolic mixed subsolid and solid lesion in the right upper
lobe, compatible with the reported clinical history of biopsy-proven
adenocarcinoma. Hypermetabolic low right paratracheal lymph node is
likely metastatic. There is also an extensive septal thickening
throughout the right upper lobe, suggesting extensive lepidic growth
of the lesion. Findings are indicative of at least T2a, N2, Tarla
disease (i.e., at least stage IIIA).
2. In addition, there is a small hypermetabolic 2.1 x 2.1 cm nodular
area in the left breast, which could represent a primary breast
neoplasm. Correlation with mammography is recommended in the near
future.
3. There is also a 2 cm rounded soft tissue attenuation lesion in
the proximal sigmoid colon which demonstrates hypermetabolism,
concerning for potential primary colonic neoplasm. Correlation with
nonemergent colonoscopy is strongly recommended in the near future.
4. Mild diffuse bronchial thickening with mild centrilobular and
paraseptal emphysema; imaging findings suggestive of underlying
COPD.
5. Nonobstructive 3 mm calculus in the lower pole collecting system
of the right kidney.

## 2018-09-06 ENCOUNTER — Ambulatory Visit: Payer: BLUE CROSS/BLUE SHIELD | Admitting: General Surgery

## 2018-10-02 ENCOUNTER — Ambulatory Visit: Payer: BC Managed Care – PPO | Admitting: General Surgery

## 2019-05-13 ENCOUNTER — Other Ambulatory Visit: Payer: Self-pay | Admitting: Family Medicine

## 2019-05-13 DIAGNOSIS — Z1231 Encounter for screening mammogram for malignant neoplasm of breast: Secondary | ICD-10-CM

## 2019-05-16 ENCOUNTER — Other Ambulatory Visit: Payer: Self-pay | Admitting: Family Medicine

## 2019-05-16 DIAGNOSIS — R921 Mammographic calcification found on diagnostic imaging of breast: Secondary | ICD-10-CM

## 2019-05-31 ENCOUNTER — Ambulatory Visit
Admission: RE | Admit: 2019-05-31 | Discharge: 2019-05-31 | Disposition: A | Discharge status: Home / Self Care | Payer: BC Managed Care – PPO | Source: Ambulatory Visit | Attending: Family Medicine | Admitting: Family Medicine

## 2019-05-31 DIAGNOSIS — R921 Mammographic calcification found on diagnostic imaging of breast: Secondary | ICD-10-CM | POA: Diagnosis present

## 2019-06-05 ENCOUNTER — Other Ambulatory Visit: Payer: Self-pay | Admitting: Family Medicine

## 2019-06-05 DIAGNOSIS — R928 Other abnormal and inconclusive findings on diagnostic imaging of breast: Secondary | ICD-10-CM

## 2019-06-05 DIAGNOSIS — R921 Mammographic calcification found on diagnostic imaging of breast: Secondary | ICD-10-CM

## 2019-06-11 ENCOUNTER — Ambulatory Visit
Admission: RE | Admit: 2019-06-11 | Discharge: 2019-06-11 | Disposition: A | Discharge status: Home / Self Care | Payer: BC Managed Care – PPO | Source: Ambulatory Visit | Attending: Family Medicine | Admitting: Family Medicine

## 2019-06-11 DIAGNOSIS — R928 Other abnormal and inconclusive findings on diagnostic imaging of breast: Secondary | ICD-10-CM

## 2019-06-11 DIAGNOSIS — R921 Mammographic calcification found on diagnostic imaging of breast: Secondary | ICD-10-CM

## 2019-06-11 HISTORY — PX: BREAST BIOPSY: SHX20

## 2019-06-12 LAB — SURGICAL PATHOLOGY

## 2021-08-12 ENCOUNTER — Other Ambulatory Visit: Payer: Self-pay | Admitting: Family Medicine

## 2021-08-12 DIAGNOSIS — Z1231 Encounter for screening mammogram for malignant neoplasm of breast: Secondary | ICD-10-CM

## 2022-12-28 ENCOUNTER — Other Ambulatory Visit: Payer: Self-pay | Admitting: Family Medicine

## 2022-12-28 DIAGNOSIS — Z1231 Encounter for screening mammogram for malignant neoplasm of breast: Secondary | ICD-10-CM

## 2023-08-04 ENCOUNTER — Encounter: Payer: Self-pay | Admitting: Family Medicine

## 2023-08-04 DIAGNOSIS — N6312 Unspecified lump in the right breast, upper inner quadrant: Secondary | ICD-10-CM

## 2023-08-04 DIAGNOSIS — N6310 Unspecified lump in the right breast, unspecified quadrant: Secondary | ICD-10-CM

## 2023-08-14 ENCOUNTER — Other Ambulatory Visit: Payer: Self-pay | Admitting: Family Medicine

## 2023-08-14 DIAGNOSIS — N6310 Unspecified lump in the right breast, unspecified quadrant: Secondary | ICD-10-CM

## 2023-08-24 ENCOUNTER — Ambulatory Visit
Admission: RE | Admit: 2023-08-24 | Discharge: 2023-08-24 | Disposition: A | Discharge status: Home / Self Care | Source: Ambulatory Visit | Attending: Family Medicine | Admitting: Family Medicine

## 2023-08-24 DIAGNOSIS — N6310 Unspecified lump in the right breast, unspecified quadrant: Secondary | ICD-10-CM | POA: Diagnosis present

## 2023-08-24 DIAGNOSIS — N6320 Unspecified lump in the left breast, unspecified quadrant: Secondary | ICD-10-CM | POA: Diagnosis present

## 2023-08-29 ENCOUNTER — Other Ambulatory Visit: Payer: Self-pay | Admitting: Family Medicine

## 2023-08-29 DIAGNOSIS — R928 Other abnormal and inconclusive findings on diagnostic imaging of breast: Secondary | ICD-10-CM

## 2023-08-30 ENCOUNTER — Inpatient Hospital Stay
Admission: RE | Admit: 2023-08-30 | Discharge: 2023-08-30 | Discharge status: Home / Self Care | Source: Ambulatory Visit | Attending: Family Medicine | Admitting: Family Medicine

## 2023-08-30 ENCOUNTER — Ambulatory Visit
Admission: RE | Admit: 2023-08-30 | Discharge: 2023-08-30 | Disposition: A | Discharge status: Home / Self Care | Source: Ambulatory Visit | Attending: Family Medicine | Admitting: Family Medicine

## 2023-08-30 DIAGNOSIS — R928 Other abnormal and inconclusive findings on diagnostic imaging of breast: Secondary | ICD-10-CM | POA: Insufficient documentation

## 2023-08-30 HISTORY — PX: BREAST BIOPSY: SHX20

## 2023-08-30 MED ORDER — LIDOCAINE-EPINEPHRINE 1 %-1:100000 IJ SOLN
8.0000 mL | Freq: Once | INTRAMUSCULAR | Status: AC
  Administered 2023-08-30: 8 mL

## 2023-08-30 MED ORDER — LIDOCAINE 1 % OPTIME INJ - NO CHARGE
2.0000 mL | Freq: Once | INTRAMUSCULAR | Status: AC
  Administered 2023-08-30: 2 mL
  Filled 2023-08-30: qty 2

## 2023-09-04 ENCOUNTER — Encounter: Payer: Self-pay | Admitting: *Deleted

## 2023-09-04 DIAGNOSIS — C50911 Malignant neoplasm of unspecified site of right female breast: Secondary | ICD-10-CM

## 2023-09-04 LAB — SURGICAL PATHOLOGY

## 2023-09-04 NOTE — Progress Notes (Signed)
 Received referral for newly diagnosed breast cancer from [facility].  Navigation initiated.  Ms. Monforte will see Dr. KATHEE on Wed. At 11 and Dr. Cesar Thursday at 4:15.

## 2023-09-06 ENCOUNTER — Other Ambulatory Visit

## 2023-09-06 ENCOUNTER — Encounter: Payer: Self-pay | Admitting: *Deleted

## 2023-09-06 ENCOUNTER — Ambulatory Visit: Admitting: Internal Medicine

## 2023-09-06 NOTE — Progress Notes (Signed)
 Receptors are not back yet on breast biopsy, per Dr. KATHEE we moved Connie Osborne appointment out to next week.  She will now see him on 7/15 at 11 (her preference of date/time).   She will still see Dr. Cesar tomorrow at 4:15.

## 2023-09-07 ENCOUNTER — Other Ambulatory Visit: Payer: Self-pay | Admitting: General Surgery

## 2023-09-07 ENCOUNTER — Telehealth: Payer: Self-pay

## 2023-09-07 DIAGNOSIS — C50211 Malignant neoplasm of upper-inner quadrant of right female breast: Secondary | ICD-10-CM

## 2023-09-07 DIAGNOSIS — Z1501 Genetic susceptibility to malignant neoplasm of breast: Secondary | ICD-10-CM

## 2023-09-07 NOTE — Telephone Encounter (Signed)
 Clinical Social Work was referred by medical provider for assessment of psychosocial needs.  CSW attempted to contact patient by phone.  Left voicemail with contact information and request for return call.

## 2023-09-07 NOTE — H&P (View-Only) (Signed)
 History of Present Illness Connie Osborne is a 63 year old female with a history of breast cancer who presents for evaluation of a new right breast mass.  She has been aware of a palpable mass in her right breast for approximately six months. A bilateral diagnostic mammogram on August 24, 2023, revealed an irregular, speculated, palpable 3.7 cm mass with microcalcifications at the one o'clock position of the right breast, along with a subcentimeter right intramammary lymph node.  I personally evaluated this images.  A core biopsy confirmed the mass as invasive ductal carcinoma with papillary features, grade two, with lymphovascular invasion present. The intramammary lymph node was positive for metastatic carcinoma.  She denies any nipple discharge.  She has a history of left breast cancer treated with partial mastectomy in 2014. At that time, she did not complete tamoxifen therapy due to severe side effects. 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  Her family history is significant for breast cancer, with two sisters having had the disease, one of whom passed away from it. She also has a niece currently undergoing treatment for breast cancer. She does not have children.  She has a history of stage one non-small cell lung carcinoma, for which she underwent a lobectomy in 2014. No adjuvant therapy was required for the lung cancer. She recalls discussions about the BRCA1 gene being related to her lung cancer, although she is uncertain about the details.      PAST MEDICAL HISTORY:  Past Medical History:  Diagnosis Date  . Breast cancer (CMS/HHS-HCC)   . HTN (hypertension)   . Lung cancer (CMS/HHS-HCC)         PAST SURGICAL HISTORY:   Past Surgical History:  Procedure Laterality Date  . THORACOSCOPY WITH LOBECTOMY LUNG Right 2018   Rt upper lobe  . foot surgery Bilateral   . HYSTERECTOMY            MEDICATIONS:  Outpatient Encounter Medications as of 09/07/2023  Medication Sig Dispense Refill  . amLODIPine  (NORVASC ) 10 MG tablet     . atorvastatin (LIPITOR) 40 MG tablet Take 40 mg by mouth once daily    . calcium carbonate-vitamin D3 (CALTRATE 600+D) 600 mg-10 mcg (400 unit) tablet Take 1 tablet by mouth once daily    . KLOR-CON  M20 20 mEq ER tablet Take 20 mEq by mouth once daily    . loratadine  (CLARITIN ) 10 mg tablet Take 10 mg by mouth as needed    . losartan  (COZAAR ) 100 MG tablet     . metoprolol  succinate (TOPROL -XL) 25 MG XL tablet     . cetirizine (ZYRTEC) 10 MG tablet  (Patient not taking: Reported on 09/07/2023)    . predniSONE  (DELTASONE ) 10 MG tablet 6 PO Q D X 1 DAY, THEN 5 PO Q D X 1 DAY, THEN 4 PO Q D X 1 DAY, THEN 3 PO Q D X 1 DAY, THEN 2 PO Q D X 1 DAY, THEN 1 PO Q D X 1 DAY (Patient not taking: Reported on 09/07/2023) 21 tablet 0  . ranitidine (ZANTAC) 150 MG tablet  (Patient not taking: Reported on 09/07/2023)     No facility-administered encounter medications on file as of 09/07/2023.     ALLERGIES:   Quinapril   SOCIAL HISTORY:  Social History   Socioeconomic History  . Marital status: Single  Tobacco Use  . Smoking status: Former  Passive exposure: Never  . Smokeless tobacco: Never  Vaping Use  . Vaping status: Never Used  Substance and Sexual Activity  . Drug use: Never    FAMILY HISTORY:  Family History  Problem Relation Name Age of Onset  . High blood pressure (Hypertension) Mother    . Diabetes type II Mother    . Lung cancer Father    . Breast cancer Sister    . Breast cancer Sister    . Lung cancer Brother    . Lung cancer Brother       GENERAL REVIEW OF SYSTEMS:   General ROS: negative for - chills, fatigue, fever, weight gain or weight loss Allergy and Immunology ROS: negative for - hives  Hematological and Lymphatic ROS: negative for - bleeding problems or bruising, negative for palpable nodes Endocrine ROS: negative for -  heat or cold intolerance, hair changes Respiratory ROS: negative for - cough, shortness of breath or wheezing Cardiovascular ROS: no chest pain or palpitations GI ROS: negative for nausea, vomiting, abdominal pain, diarrhea, constipation Musculoskeletal ROS: negative for - joint swelling or muscle pain Neurological ROS: negative for - confusion, syncope Dermatological ROS: negative for pruritus and rash  PHYSICAL EXAM:  Vitals:   09/07/23 0730  BP: (!) 141/90  Pulse: 72  .  Ht:165.1 cm (5' 5) Wt:79.4 kg (175 lb) ADJ:Anib surface area is 1.91 meters squared. Body mass index is 29.12 kg/m.SABRA   GENERAL: Alert, active, oriented x3  HEENT: Pupils equal reactive to light. Extraocular movements are intact. Sclera clear. Palpebral conjunctiva normal red color.Pharynx clear.  NECK: Supple with no palpable mass and no adenopathy.  LUNGS: Sound clear with no rales rhonchi or wheezes.  HEART: Regular rhythm S1 and S2 without murmur.  BREAST: Right breast large palpable mass at the 1 o'clock position.  No clinically axillary adenopathy palpated.  No ulcers on the skin.  No nipple discharge.  No nipple retraction.  Left breast without any palpable masses, skin changes or axillary adenopathy.  EXTREMITIES: Well-developed well-nourished symmetrical with no dependent edema.  NEUROLOGICAL: Awake alert oriented, facial expression symmetrical, moving all extremities.   Results RADIOLOGY Bilateral diagnostic mammogram: Irregular speculated palpable 3.7 cm mass with microcalcifications at 1 o'clock position of the right breast. Subcentimeter right intramammary lymph node. (08/24/2023)  PATHOLOGY Core biopsy of right breast mass: Invasive ductal carcinoma with papillary features, Grade 2. Lymphovascular invasion present. Core biopsy of right intramammary lymph node: Positive for metastatic carcinoma.    Assessment & Plan Invasive ductal carcinoma of right breast   Invasive ductal carcinoma of  the right breast with papillary features, grade 2, and lymphovascular invasion. The mass measures 3.7 cm with microcalcifications at the 1 o'clock position. There is a positive intramammary lymph node for metastatic carcinoma. She is at high risk of recurrence due to BRCA1 positivity.  Will recommend to proceed with preoperative bilateral breast MRI with and without contrast for evaluation of invasion of the mass to the muscle and rule out any possible disease in the left breast.  We recommend left breast total mastectomy with sentinel lymph node biopsy with possible conversion to modified radical mastectomy if more than 3 sentinel lymph nodes are positive intraoperatively.  Will also recommend at least left breast prophylactic mastectomy due to BRCA 1 positive.  Await results of ER and HER2 receptors to determine the need for additional treatments such as chemotherapy. Admit for an overnight hospital stay post-surgery. Educate on post-operative care including drain management. Discussed the option of  reconstruction, which she declined.  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 Family history includes two sisters with breast cancer, one of whom passed away, and a niece currently undergoing treatment. Emphasized the nearly 100% risk of developing breast cancer for family members with BRCA1 positivity. Recommend genetic testing for BRCA1 for sisters and nieces. Advise family members with BRCA1 positivity to consider prophylactic mastectomy.  Ductal carcinoma in situ (DCIS) of left breast   DCIS in the left breast was treated with partial mastectomy and radiation in 2014. She did not complete tamoxifen therapy due to adverse effects.  Stage I non-small cell lung cancer   Stage I non-small cell lung cancer was treated with lobectomy. No adjuvant therapy was required.   Malignant neoplasm of upper-inner quadrant of right female  breast, unspecified estrogen receptor status (CMS/HHS-HCC) [C50.211]          Patient verbalized understanding, all questions were answered, and were agreeable with the plan outlined above.   I spent a total of 80 minutes in both face-to-face and non-face-to-face activities, excluding procedures performed, for this visit on the date of this encounter.  Lucas Sjogren, MD  Electronically signed by Lucas Sjogren, MD

## 2023-09-11 ENCOUNTER — Encounter: Payer: Self-pay | Admitting: *Deleted

## 2023-09-11 NOTE — Progress Notes (Signed)
 [facility] called x3 (today, on July 8th and July11th), checking on breast prognostics on the biopsy specimen.   Today I was transferred to pathologist Dr. Frutoso and she said they had been ordered and she would look into where the results are and keep me posted.

## 2023-09-12 ENCOUNTER — Encounter: Payer: Self-pay | Admitting: Internal Medicine

## 2023-09-12 ENCOUNTER — Encounter: Payer: Self-pay | Admitting: *Deleted

## 2023-09-12 ENCOUNTER — Inpatient Hospital Stay

## 2023-09-12 ENCOUNTER — Ambulatory Visit: Admitting: Internal Medicine

## 2023-09-12 ENCOUNTER — Telehealth: Payer: Self-pay

## 2023-09-12 ENCOUNTER — Inpatient Hospital Stay: Attending: Internal Medicine | Admitting: Internal Medicine

## 2023-09-12 ENCOUNTER — Other Ambulatory Visit

## 2023-09-12 VITALS — BP 122/84 | HR 73 | Temp 97.2°F | Resp 20 | Ht 65.0 in | Wt 179.0 lb

## 2023-09-12 DIAGNOSIS — Z79899 Other long term (current) drug therapy: Secondary | ICD-10-CM | POA: Insufficient documentation

## 2023-09-12 DIAGNOSIS — Z85118 Personal history of other malignant neoplasm of bronchus and lung: Secondary | ICD-10-CM | POA: Insufficient documentation

## 2023-09-12 DIAGNOSIS — Z170 Estrogen receptor positive status [ER+]: Secondary | ICD-10-CM

## 2023-09-12 DIAGNOSIS — I10 Essential (primary) hypertension: Secondary | ICD-10-CM | POA: Insufficient documentation

## 2023-09-12 DIAGNOSIS — R7303 Prediabetes: Secondary | ICD-10-CM | POA: Insufficient documentation

## 2023-09-12 DIAGNOSIS — Z1721 Progesterone receptor positive status: Secondary | ICD-10-CM | POA: Diagnosis not present

## 2023-09-12 DIAGNOSIS — Z1732 Human epidermal growth factor receptor 2 negative status: Secondary | ICD-10-CM | POA: Insufficient documentation

## 2023-09-12 DIAGNOSIS — K219 Gastro-esophageal reflux disease without esophagitis: Secondary | ICD-10-CM | POA: Insufficient documentation

## 2023-09-12 DIAGNOSIS — Z87891 Personal history of nicotine dependence: Secondary | ICD-10-CM | POA: Insufficient documentation

## 2023-09-12 DIAGNOSIS — C50411 Malignant neoplasm of upper-outer quadrant of right female breast: Secondary | ICD-10-CM | POA: Diagnosis present

## 2023-09-12 DIAGNOSIS — Z1501 Genetic susceptibility to malignant neoplasm of breast: Secondary | ICD-10-CM | POA: Insufficient documentation

## 2023-09-12 LAB — CMP (CANCER CENTER ONLY)
ALT: 15 U/L (ref 0–44)
AST: 18 U/L (ref 15–41)
Albumin: 3.7 g/dL (ref 3.5–5.0)
Alkaline Phosphatase: 98 U/L (ref 38–126)
Anion gap: 10 (ref 5–15)
BUN: 19 mg/dL (ref 8–23)
CO2: 26 mmol/L (ref 22–32)
Calcium: 10.1 mg/dL (ref 8.9–10.3)
Chloride: 107 mmol/L (ref 98–111)
Creatinine: 0.59 mg/dL (ref 0.44–1.00)
GFR, Estimated: >60 mL/min (ref >60)
Glucose, Bld: 156 mg/dL — ABNORMAL HIGH (ref 70–99)
Potassium: 3.2 mmol/L — ABNORMAL LOW (ref 3.5–5.1)
Sodium: 143 mmol/L (ref 135–145)
Total Bilirubin: 0.3 mg/dL (ref 0.0–1.2)
Total Protein: 7.4 g/dL (ref 6.5–8.1)

## 2023-09-12 LAB — CBC WITH DIFFERENTIAL (CANCER CENTER ONLY)
Abs Immature Granulocytes: 0.03 K/uL (ref 0.00–0.07)
Basophils Absolute: 0.1 K/uL (ref 0.0–0.1)
Basophils Relative: 1 %
Eosinophils Absolute: 0.3 K/uL (ref 0.0–0.5)
Eosinophils Relative: 3 %
HCT: 34.8 % — ABNORMAL LOW (ref 36.0–46.0)
Hemoglobin: 11.8 g/dL — ABNORMAL LOW (ref 12.0–15.0)
Immature Granulocytes: 0 %
Lymphocytes Relative: 31 %
Lymphs Abs: 2.6 K/uL (ref 0.7–4.0)
MCH: 30.4 pg (ref 26.0–34.0)
MCHC: 33.9 g/dL (ref 30.0–36.0)
MCV: 89.7 fL (ref 80.0–100.0)
Monocytes Absolute: 0.7 K/uL (ref 0.1–1.0)
Monocytes Relative: 8 %
Neutro Abs: 4.7 K/uL (ref 1.7–7.7)
Neutrophils Relative %: 57 %
Platelet Count: 340 K/uL (ref 150–400)
RBC: 3.88 MIL/uL (ref 3.87–5.11)
RDW: 12.5 % (ref 11.5–15.5)
WBC Count: 8.4 K/uL (ref 4.0–10.5)
nRBC: 0 % (ref 0.0–0.2)

## 2023-09-12 NOTE — Progress Notes (Signed)
 Hx of left breast cancer 2014, BRACA+ and lung cancer.  C/o cough, been going on for a whole.

## 2023-09-12 NOTE — Progress Notes (Signed)
 [facility] Clinical Social Work  Clinical Social Work was referred by Engineer, civil (consulting) for need for [employer].  Clinical Social Worker contacted patient by phone to offer support and assess for needs.    Patient inquired about Social Security Disability.  Informed her that the agency [facility] works with, the [employer], will not process applications for anyone 63 and older.  Interventions: Referred patient to community resources: Social Security Disability.       Follow Up Plan:  Patient will contact CSW with any support or resource needs    Connie CHRISTELLA Au, LCSW  Clinical Social Worker [facility]

## 2023-09-12 NOTE — Telephone Encounter (Signed)
 Clinical Social Work was referred by medical provider for assessment of psychosocial needs.  CSW attempted to contact patient by phone a second time.  Left voicemail with contact information and request for return call.

## 2023-09-12 NOTE — Assessment & Plan Note (Addendum)
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 Ki-67 50% however-  tenting of the underlying pectoralis muscle on mammogram, with abutment and possible invasion on ultrasound.  Recommend MRI of the bilateral breasts. Dr.Cintron  # # I had a long discussion with the patient in general regarding the treatment options of breast cancer including-surgery; adjuvant radiation; role of adjuvant systemic therapy including-chemotherapy antihormone therapy.   # Given multifocality-large size of the tumor; given BRCA 1 positive-agree with mastectomy with sentinel lymph node evaluation.  And also recommend contralateral prophylactic mastectomy. # Patient will likely not need radiation with mastectomy.  Patient declined any reconstruction.  # Given the clinical characteristics-T2/multifocality-possible invasion of the pectoralis-Ki-67 about 50%-and BRCA 1-patient likely will need systemic chemotherapy-most likely neoadjuvant.  Will check Oncotype-and also await MRI of the breast.  Patient understand that she will also need a port placement-with chemotherapy.   # GERD: stable.   # HTN:  stable.    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  # Remote history of lung cancer stage I-no concerns for any clinical recurrence.  # BRCA-1-prophylactic contralateral mastectomy.  Consider bilateral prophylactic oophorectomy down the line.  Thank you Dr.Linda Buren for allowing me to participate in the care of your pleasant patient. Please do not hesitate to contact me with questions or concerns in the interim.  Discussed with Dr. Cesar.   # DISPOSITION: # oncotype-  # labs- today- cbc/cmp/ca 27-29; CEA; Ca 15-3- please order # follow up in appx 3 weeks-MD: no labs-- Dr.B

## 2023-09-12 NOTE — Progress Notes (Signed)
 Accompanied patient and family to initial medical oncology appointment.   Reviewed Breast Cancer treatment handbook.   Care plan summary given to patient.   Reviewed outreach programs and cancer center services.   Oncotype Dx order [id number] submitted online

## 2023-09-12 NOTE — Progress Notes (Signed)
 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   # RIGHT BREAST T2; intramammary lymph node positive- N-0- ER-PR POSITIVE: her 2 neg; Ki-67-50%  # Colonoscopy [Dr.sankar-Neg June 2017]  # Smoker; BRCA-1 positive   Primary cancer of right upper lobe of lung (HCC)  10/26/2015 Initial Diagnosis   Primary cancer of right upper lobe of lung (HCC)   Carcinoma of upper-outer quadrant of right breast in female, estrogen receptor positive (HCC)  09/12/2023 Initial Diagnosis   Carcinoma of upper-outer quadrant of right breast in female, estrogen receptor positive (HCC)   09/12/2023 Cancer Staging   Staging form: Breast, AJCC 8th Edition - Clinical: Stage IIA (cT2, cN1, cM0, G2, ER+, PR+, HER2-) - Signed by Rennie Connie SAUNDERS, MD on 09/12/2023 Histologic grading system: 3 grade system     HISTORY OF PRESENTING ILLNESS: Patient ambulating-independently. Alone.   Connie Osborne 63 y.o.  female pleasant patient with BRCA-1- left breast DCIS; and remote history of stage I lung cancer s/p surgery- currently dx with right breast cancer is here to discuss her treatment options   Patient noted to have a lump in the right breast.  Unfortunately patient has not been quite compliant with her mammograms.   Abnormality was further worked up with mammogram diagnostic ultrasound and subsequent biopsy.  Patient  has been evaluated by surgery.   Patient denies any unusual shortness of breath or bone pain.    Review of Systems  Constitutional:  Positive for malaise/fatigue. Negative for chills, diaphoresis, fever and weight loss.  HENT:  Negative for nosebleeds and sore throat.   Eyes:  Negative for double vision.  Respiratory:  Negative for cough, hemoptysis, sputum production, shortness of breath and wheezing.   Cardiovascular:  Negative for chest pain, palpitations, orthopnea and leg swelling.  Gastrointestinal:  Negative for abdominal pain, blood in stool, constipation, diarrhea, heartburn, melena, nausea and vomiting.  Genitourinary:  Negative for dysuria, frequency and urgency.  Musculoskeletal:  Positive for back pain and joint pain.  Skin: Negative.  Negative for itching and rash.  Neurological:  Negative for dizziness, tingling, focal weakness, weakness and headaches.  Endo/Heme/Allergies:  Does not bruise/bleed easily.  Psychiatric/Behavioral:  Negative for depression. The patient is not nervous/anxious and does not have insomnia.     MEDICAL HISTORY:  Past Medical History:  Diagnosis Date   Allergy    Arthritis    Breast cancer (HCC) 2014   Left- Radiation; BRCA 1 +    COPD (chronic obstructive pulmonary disease) (HCC)    Coughing up blood    GERD (gastroesophageal reflux disease)    Hypertension    Lung cancer (HCC)    Carcinoma, right upper lobe    Personal history of radiation therapy    Shortness of breath dyspnea    Vitamin D deficiency     SURGICAL HISTORY: Past Surgical History:  Procedure Laterality Date   ABDOMINAL HYSTERECTOMY     BREAST BIOPSY Left 2014   +  BREAST BIOPSY Left 06/11/2019   stereo bx, x-clip, negative   BREAST BIOPSY Right 08/30/2023   US  RT BREAST BX W LOC DEV 1ST LESION IMG BX SPEC US  GUIDE 08/30/2023 [facility]   BREAST BIOPSY Right 08/30/2023   US  RT BREAST BX W LOC DEV EA ADD LESION IMG BX SPEC US  GUIDE 08/30/2023 [facility]    BREAST EXCISIONAL BIOPSY Left 2014   BREAST LUMPECTOMY Left 2014   BREAST SURGERY Left 2014   lumpectomy   COLONOSCOPY WITH PROPOFOL  N/A 08/18/2015   Procedure: COLONOSCOPY WITH PROPOFOL ;  Surgeon: Louanne KANDICE Muse, MD;  Location: [facility];  Service: Endoscopy;  Laterality: N/A;   COLONOSCOPY WITH PROPOFOL  N/A 10/12/2016   Procedure: COLONOSCOPY WITH PROPOFOL ;  Surgeon: Muse Louanne KANDICE, MD;  Location: [facility];  Service: Endoscopy;  Laterality: N/A;   ELECTROMAGNETIC NAVIGATION BROCHOSCOPY Right 07/28/2015   Procedure: ELECTROMAGNETIC NAVIGATION BRONCHOSCOPY;  Surgeon: Nickolas Cellar, MD;  Location: [facility];  Service: Cardiopulmonary;  Laterality: Right;   FOOT SURGERY     FRACTURE SURGERY     GANGLION CYST EXCISION     THORACOTOMY/LOBECTOMY Right 09/14/2015   Procedure: THORACOTOMY/LOBECTOMY;  Surgeon: Louanne KANDICE Muse, MD;  Location: [facility];  Service: Thoracic;  Laterality: Right;    SOCIAL HISTORY: Social History   Socioeconomic History   Marital status: Single    Spouse name: Not on file   Number of children: Not on file   Years of education: Not on file   Highest education level: Not on file  Occupational History   Not on file  Tobacco Use   Smoking status: Former    Current packs/day: 0.00    Average packs/day: 1 pack/day for 30.0 years (30.0 ttl pk-yrs)    Types: Cigarettes    Start date: 07/29/1985    Quit date: 07/30/2015    Years since quitting: 8.1   Smokeless tobacco: Never  Vaping Use   Vaping status: Never Used  Substance and Sexual Activity   Alcohol use: No    Alcohol/week: 0.0 standard drinks of alcohol   Drug use: No   Sexual activity: Not on file  Other Topics Concern   Not on file  Social History Narrative   Not on file   Social Drivers of Health   Financial Resource Strain: Medium Risk (09/07/2023)   Received from [facility]   Overall Financial Resource Strain (CARDIA)    Difficulty of Paying Living  Expenses: Somewhat hard  Food Insecurity: No Food Insecurity (09/12/2023)   Hunger Vital Sign    Worried About Running Out of Food in the Last Year: Never true    Ran Out of Food in the Last Year: Never true  Recent Concern: Food Insecurity - Food Insecurity Present (09/07/2023)   Received from [facility]   Hunger Vital Sign    Within the past 12 months, you worried that your food would run out before you got the money to buy more.: Sometimes true    Within the past 12 months, the food you bought just didn't last and you didn't have money to get more.: Never true  Transportation Needs: No Transportation Needs (09/12/2023)   PRAPARE - Administrator, Civil Service (Medical): No    Lack of Transportation (Non-Medical): No  Physical Activity: Not on file  Stress: Not on file  Social Connections: Not on file  Intimate Partner Violence: Not At Risk (09/12/2023)   Humiliation, Afraid, Rape, and Kick questionnaire  Fear of Current or Ex-Partner: No    Emotionally Abused: No    Physically Abused: No    Sexually Abused: No    FAMILY HISTORY: Family History  Problem Relation Age of Onset   Hypertension Mother    Diabetes Mellitus II Mother    Cancer Mother        mets   Cancer Father        long cancer   Cancer Sister 73       breast   Breast cancer Sister 37   Cancer Sister 47       breast   Breast cancer Sister 40   Lung cancer Brother    Cancer Other        breast    ALLERGIES:  is allergic to accupril [quinapril hcl] and percocet [oxycodone -acetaminophen ].  MEDICATIONS:  Current Outpatient Medications  Medication Sig Dispense Refill   amLODipine  (NORVASC ) 10 MG tablet Take 10 mg by mouth daily with lunch.      atorvastatin (LIPITOR) 40 MG tablet Take 40 mg by mouth daily.     cetirizine (ZYRTEC) 10 MG tablet Take 10 mg by mouth as needed for allergies.     losartan  (COZAAR ) 100 MG tablet Take 100 mg by mouth daily with lunch.      metoprolol   succinate (TOPROL -XL) 25 MG 24 hr tablet Take 25 mg by mouth daily with lunch.      Potassium Chloride  ER 20 MEQ TBCR Take 1 tablet by mouth daily.     No current facility-administered medications for this visit.    PHYSICAL EXAMINATION:   Vitals:   09/12/23 1045  BP: 122/84  Pulse: 73  Resp: 20  Temp: (!) 97.2 F (36.2 C)  SpO2: 100%   Filed Weights   09/12/23 1045  Weight: 179 lb (81.2 kg)   Right breast 12 o'clock position-approximately 3 to 4 cm mass-slightly mobile.  No skin changes no nipple changes.  No underarm lymphadenopathy.  Physical Exam Vitals and nursing note reviewed.  HENT:     Head: Normocephalic and atraumatic.     Mouth/Throat:     Pharynx: Oropharynx is clear.  Eyes:     Extraocular Movements: Extraocular movements intact.     Pupils: Pupils are equal, round, and reactive to light.  Cardiovascular:     Rate and Rhythm: Normal rate and regular rhythm.  Pulmonary:     Comments: Decreased breath sounds bilaterally.  Abdominal:     Palpations: Abdomen is soft.  Musculoskeletal:        General: Normal range of motion.     Cervical back: Normal range of motion.  Skin:    General: Skin is warm.  Neurological:     General: No focal deficit present.     Mental Status: She is alert and oriented to person, place, and time.  Psychiatric:        Behavior: Behavior normal.        Judgment: Judgment normal.     LABORATORY DATA:  I have reviewed the data as listed Lab Results  Component Value Date   WBC 6.3 09/19/2016   HGB 13.1 09/19/2016   HCT 39.2 09/19/2016   MCV 90 09/19/2016   PLT 336 09/19/2016   No results for input(s): NA, K, CL, CO2, GLUCOSE, BUN, CREATININE, CALCIUM, GFRNONAA, GFRAA, PROT, ALBUMIN, AST, ALT, ALKPHOS, BILITOT, BILIDIR, IBILI in the last 8760 hours.  RADIOGRAPHIC STUDIES: I have personally reviewed the radiological images as listed and agreed with the  findings in the report. US  RT  BREAST BX W LOC DEV EA ADD LESION IMG BX SPEC US  GUIDE Addendum Date: 09/05/2023 ADDENDUM REPORT: 09/05/2023 14:00 ADDENDUM: PATHOLOGY revealed: Site 1. Breast, right, needle core biopsy, 1:00; 12 cmfn : - INVASIVE DUCTAL CARCINOMA WITH PAPILLARY FEATURES - OVERALL GRADE: 2 - LYMPHOVASCULAR INVASION: PRESENT - CANCER LENGTH: 1.5 CM - CALCIFICATIONS: NOT IDENTIFIED. Pathology results are CONCORDANT with imaging findings, per Dirk Arrant M.D. PATHOLOGY revealed: Site 2. Breast, right, needle core biopsy, 10:00 10 cmfn : - POSITIVE FOR METASTATIC CARCINOMA. Diagnosis Note : The 2 metastatic foci are approximately 0.5 mm and are best classified as micrometastasis on the needle core biopsy specimen. Pathology results are CONCORDANT with imaging findings, per Dirk Arrant M.D. Pathology results and recommendations below were discussed with patient by telephone on 09/04/2023 by Rock Hover RN. Patient reported biopsy site within normal limits with slight tenderness at the site. Post biopsy care instructions were reviewed, questions were answered and my direct phone number was provided to patient. Patient was instructed to call [facility] if any concerns or questions arise related to the biopsy. RECOMMENDATIONS: 1. Surgical and oncological consultation. Request for surgical and oncological consultation relayed to Shasta Ada RN at [facility] by Rock Hover RN on 09/04/2023. 2. Recommend bilateral breast MRI with and without contrast to assess for pectoralis invasion by primary mass. Pathology results reported by Rock Hover RN on 09/05/2023. Electronically Signed   By: Dirk Arrant M.D.   On: 09/05/2023 14:00   Result Date: 09/05/2023 CLINICAL DATA:  63 year old female presenting for ultrasound-guided biopsy of a suspicious palpable mass in the RIGHT breast and an enlarged intramammary lymph node in the RIGHT breast. EXAM: ULTRASOUND GUIDED RIGHT BREAST CORE NEEDLE BIOPSY COMPARISON:   Previous exam(s). PROCEDURE: I met with the patient and we discussed the procedure of ultrasound-guided biopsy, including benefits and alternatives. We discussed the high likelihood of a successful procedure. We discussed the risks of the procedure, including infection, bleeding, tissue injury, clip migration, and inadequate sampling. Informed written consent was given. The usual time-out protocol was performed immediately prior to the procedure. SITE 1: RIGHT breast mass 1 o'clock 12 cm from the nipple (RIBBON clip) Lesion quadrant: Upper inner quadrant Using sterile technique and 1% Lidocaine  as local anesthetic, under direct ultrasound visualization, a 14 gauge spring-loaded device was used to perform biopsy of a mass in the right breast 1 o'clock position 12 cm from the nipple using a lateral approach. At the conclusion of the procedure, a ribbon shaped tissue marker clip was deployed into the biopsy cavity. SITE 2: RIGHT breast intramammary lymph node 10 o'clock 10 cm from nipple (COIL clip) Lesion quadrant: Upper outer quadrant Using sterile technique and 1% Lidocaine  as local anesthetic, under direct ultrasound visualization, a 14 gauge spring-loaded device was used to perform biopsy of an intramammary lymph node in the right breast 10 o'clock position 10 cm from the nipple using a lateral approach. At the conclusion of the procedure, a coil shaped tissue marker clip was deployed into the biopsy cavity. Follow up 2 view mammogram was performed and dictated separately. IMPRESSION: 1. Ultrasound guided biopsy of a suspicious mass in the RIGHT breast 1 o'clock position. No apparent complications. 2. Ultrasound guided biopsy of a prominent intramammary lymph node in the RIGHT breast 10 o'clock position. No apparent complications. Electronically Signed: By: Dirk Arrant M.D. On: 08/30/2023 13:55   US  RT BREAST BX W LOC DEV 1ST LESION IMG  BX SPEC US  GUIDE Addendum Date: 09/05/2023 ADDENDUM REPORT: 09/05/2023  14:00 ADDENDUM: PATHOLOGY revealed: Site 1. Breast, right, needle core biopsy, 1:00; 12 cmfn : - INVASIVE DUCTAL CARCINOMA WITH PAPILLARY FEATURES - OVERALL GRADE: 2 - LYMPHOVASCULAR INVASION: PRESENT - CANCER LENGTH: 1.5 CM - CALCIFICATIONS: NOT IDENTIFIED. Pathology results are CONCORDANT with imaging findings, per Dirk Arrant M.D. PATHOLOGY revealed: Site 2. Breast, right, needle core biopsy, 10:00 10 cmfn : - POSITIVE FOR METASTATIC CARCINOMA. Diagnosis Note : The 2 metastatic foci are approximately 0.5 mm and are best classified as micrometastasis on the needle core biopsy specimen. Pathology results are CONCORDANT with imaging findings, per Dirk Arrant M.D. Pathology results and recommendations below were discussed with patient by telephone on 09/04/2023 by Rock Hover RN. Patient reported biopsy site within normal limits with slight tenderness at the site. Post biopsy care instructions were reviewed, questions were answered and my direct phone number was provided to patient. Patient was instructed to call [facility] if any concerns or questions arise related to the biopsy. RECOMMENDATIONS: 1. Surgical and oncological consultation. Request for surgical and oncological consultation relayed to Shasta Ada RN at [facility] by Rock Hover RN on 09/04/2023. 2. Recommend bilateral breast MRI with and without contrast to assess for pectoralis invasion by primary mass. Pathology results reported by Rock Hover RN on 09/05/2023. Electronically Signed   By: Dirk Arrant M.D.   On: 09/05/2023 14:00   Result Date: 09/05/2023 CLINICAL DATA:  63 year old female presenting for ultrasound-guided biopsy of a suspicious palpable mass in the RIGHT breast and an enlarged intramammary lymph node in the RIGHT breast. EXAM: ULTRASOUND GUIDED RIGHT BREAST CORE NEEDLE BIOPSY COMPARISON:  Previous exam(s). PROCEDURE: I met with the patient and we discussed the procedure of ultrasound-guided biopsy,  including benefits and alternatives. We discussed the high likelihood of a successful procedure. We discussed the risks of the procedure, including infection, bleeding, tissue injury, clip migration, and inadequate sampling. Informed written consent was given. The usual time-out protocol was performed immediately prior to the procedure. SITE 1: RIGHT breast mass 1 o'clock 12 cm from the nipple (RIBBON clip) Lesion quadrant: Upper inner quadrant Using sterile technique and 1% Lidocaine  as local anesthetic, under direct ultrasound visualization, a 14 gauge spring-loaded device was used to perform biopsy of a mass in the right breast 1 o'clock position 12 cm from the nipple using a lateral approach. At the conclusion of the procedure, a ribbon shaped tissue marker clip was deployed into the biopsy cavity. SITE 2: RIGHT breast intramammary lymph node 10 o'clock 10 cm from nipple (COIL clip) Lesion quadrant: Upper outer quadrant Using sterile technique and 1% Lidocaine  as local anesthetic, under direct ultrasound visualization, a 14 gauge spring-loaded device was used to perform biopsy of an intramammary lymph node in the right breast 10 o'clock position 10 cm from the nipple using a lateral approach. At the conclusion of the procedure, a coil shaped tissue marker clip was deployed into the biopsy cavity. Follow up 2 view mammogram was performed and dictated separately. IMPRESSION: 1. Ultrasound guided biopsy of a suspicious mass in the RIGHT breast 1 o'clock position. No apparent complications. 2. Ultrasound guided biopsy of a prominent intramammary lymph node in the RIGHT breast 10 o'clock position. No apparent complications. Electronically Signed: By: Dirk Arrant M.D. On: 08/30/2023 13:55   MM CLIP PLACEMENT RIGHT Result Date: 08/30/2023 CLINICAL DATA:  Status post ultrasound-guided biopsy of a suspicious palpable mass in the RIGHT breast and a  prominent RIGHT intramammary lymph node. EXAM: 3D DIAGNOSTIC RIGHT  MAMMOGRAM POST ULTRASOUND BIOPSY COMPARISON:  Previous exam(s). ACR Breast Density Category b: There are scattered areas of fibroglandular density. FINDINGS: 3D Mammographic images were obtained following ultrasound guided biopsy of a palpable mass in the right breast 1 o'clock position and a prominent right intramammary lymph node. The ribbon shaped biopsy marking clip is in expected position at the site of biopsy in the right breast 1 o'clock position 12 cm from nipple. The coil shaped biopsy marking clip is in expected position at the site of biopsy in the right breast 10 o'clock position 10 cm from nipple. IMPRESSION: Appropriate positioning of the ribbon and coil shaped biopsy marking clips at the sites of biopsy in the RIGHT breast. Final Assessment: Post Procedure Mammograms for Marker Placement Electronically Signed   By: Dirk Arrant M.D.   On: 08/30/2023 14:01   MM 3D DIAGNOSTIC MAMMOGRAM BILATERAL BREAST Result Date: 08/24/2023 CLINICAL DATA:  RIGHT breast lump times several months. History of LEFT breast cancer in 2014. Prior benign biopsy of calcifications at the LEFT breast lumpectomy site in 2021. Due for annual. EXAM: DIGITAL DIAGNOSTIC BILATERAL MAMMOGRAM WITH TOMOSYNTHESIS AND CAD; ULTRASOUND RIGHT BREAST LIMITED TECHNIQUE: Bilateral digital diagnostic mammography and breast tomosynthesis was performed. The images were evaluated with computer-aided detection. ; Targeted ultrasound examination of the right breast was performed COMPARISON:  Previous exam(s). ACR Breast Density Category b: There are scattered areas of fibroglandular density. FINDINGS: Full field and spot-compression views of the palpable mass in the RIGHT breast demonstrate a 3.7 cm irregular mass with spiculated margins and associated microcalcifications along its anterior margin at the 12 to 1 o'clock position. There is apparent tenting of the RIGHT pectoralis muscle underlying the mass. Interval increase in size of 5 small  low axillary/intramammary lymph nodes in the upper-outer quadrant, the largest measuring up to 7 x 7 mm, previously 4 x 4 mm. A questioned asymmetry in the RIGHT breast on the MLO has the appearance of normal fibroglandular tissue on spot compression views, which is unchanged in configuration dating back to 2021. Unchanged appearance of biopsy-proven benign calcifications at the LEFT breast lumpectomy site on spot compression magnification views. Targeted ultrasound of the palpable area of concern demonstrates a 37 x 30 x 34 mm irregular mass with spiculated margins. It abuts and may invade the underlying pectoralis along its superior margin. Five prominent intramammary lymph nodes are noted in the upper-outer quadrant of the RIGHT breast, with the largest located at 10 o'clock, 10 cm from the nipple and measuring up to 7 x 5 x 6 mm with a cortical thickness of 2.9 mm. Complete RIGHT axillary ultrasound was performed without enlarged axillary lymph node. IMPRESSION: 1. Irregular, spiculated palpable 3.7 cm mass with associated microcalcification at the 1 o'clock position of the RIGHT breast is highly suspicious for malignancy and requires further characterization with ultrasound-guided biopsy. There is tenting of the underlying pectoralis muscle on mammogram, with abutment and possible invasion on ultrasound. Once results of the biopsy are available, MRI should be performed to better assess suspected muscular invasion. 2. Subcentimeter RIGHT intramammary lymph nodes are increased in size from 2021 and are worrisome for possible metastases. Ultrasound-guided biopsy of the most prominent lymph node measuring 7 mm is recommended. 3.  No evidence of axillary lymphadenopathy. 4. No significant interval change in biopsy-proven benign calcifications at the LEFT breast lumpectomy site. No mammographic evidence of malignancy in the RIGHT breast. RECOMMENDATION: 1. Ultrasound-guided biopsy of the  3.7 cm BI-RADS 5 mass in the  RIGHT breast. 2. Ultrasound-guided biopsy of the most prominent intramammary lymph node at 10 o'clock in the RIGHT breast, measuring 7 mm. 3. Once biopsy results are available, MRI of the breasts with and without contrast should be performed to better assess possible pectoralis invasion by the primary mass. I have discussed the findings and recommendations with the patient. If applicable, a reminder letter will be sent to the patient regarding the next appointment. BI-RADS CATEGORY  5: Highly suggestive of malignancy. Electronically Signed   By: Norleen Croak M.D.   On: 08/24/2023 16:22   US  LIMITED ULTRASOUND INCLUDING AXILLA RIGHT BREAST Result Date: 08/24/2023 CLINICAL DATA:  RIGHT breast lump times several months. History of LEFT breast cancer in 2014. Prior benign biopsy of calcifications at the LEFT breast lumpectomy site in 2021. Due for annual. EXAM: DIGITAL DIAGNOSTIC BILATERAL MAMMOGRAM WITH TOMOSYNTHESIS AND CAD; ULTRASOUND RIGHT BREAST LIMITED TECHNIQUE: Bilateral digital diagnostic mammography and breast tomosynthesis was performed. The images were evaluated with computer-aided detection. ; Targeted ultrasound examination of the right breast was performed COMPARISON:  Previous exam(s). ACR Breast Density Category b: There are scattered areas of fibroglandular density. FINDINGS: Full field and spot-compression views of the palpable mass in the RIGHT breast demonstrate a 3.7 cm irregular mass with spiculated margins and associated microcalcifications along its anterior margin at the 12 to 1 o'clock position. There is apparent tenting of the RIGHT pectoralis muscle underlying the mass. Interval increase in size of 5 small low axillary/intramammary lymph nodes in the upper-outer quadrant, the largest measuring up to 7 x 7 mm, previously 4 x 4 mm. A questioned asymmetry in the RIGHT breast on the MLO has the appearance of normal fibroglandular tissue on spot compression views, which is unchanged in  configuration dating back to 2021. Unchanged appearance of biopsy-proven benign calcifications at the LEFT breast lumpectomy site on spot compression magnification views. Targeted ultrasound of the palpable area of concern demonstrates a 37 x 30 x 34 mm irregular mass with spiculated margins. It abuts and may invade the underlying pectoralis along its superior margin. Five prominent intramammary lymph nodes are noted in the upper-outer quadrant of the RIGHT breast, with the largest located at 10 o'clock, 10 cm from the nipple and measuring up to 7 x 5 x 6 mm with a cortical thickness of 2.9 mm. Complete RIGHT axillary ultrasound was performed without enlarged axillary lymph node. IMPRESSION: 1. Irregular, spiculated palpable 3.7 cm mass with associated microcalcification at the 1 o'clock position of the RIGHT breast is highly suspicious for malignancy and requires further characterization with ultrasound-guided biopsy. There is tenting of the underlying pectoralis muscle on mammogram, with abutment and possible invasion on ultrasound. Once results of the biopsy are available, MRI should be performed to better assess suspected muscular invasion. 2. Subcentimeter RIGHT intramammary lymph nodes are increased in size from 2021 and are worrisome for possible metastases. Ultrasound-guided biopsy of the most prominent lymph node measuring 7 mm is recommended. 3.  No evidence of axillary lymphadenopathy. 4. No significant interval change in biopsy-proven benign calcifications at the LEFT breast lumpectomy site. No mammographic evidence of malignancy in the RIGHT breast. RECOMMENDATION: 1. Ultrasound-guided biopsy of the 3.7 cm BI-RADS 5 mass in the RIGHT breast. 2. Ultrasound-guided biopsy of the most prominent intramammary lymph node at 10 o'clock in the RIGHT breast, measuring 7 mm. 3. Once biopsy results are available, MRI of the breasts with and without contrast should be performed to better  assess possible pectoralis  invasion by the primary mass. I have discussed the findings and recommendations with the patient. If applicable, a reminder letter will be sent to the patient regarding the next appointment. BI-RADS CATEGORY  5: Highly suggestive of malignancy. Electronically Signed   By: Norleen Croak M.D.   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 Ki-67 50% however-  tenting of the underlying pectoralis muscle on mammogram, with abutment and possible invasion on ultrasound.  Recommend MRI of the bilateral breasts.    # # I had a long discussion with the patient in general regarding the treatment options of breast cancer including-surgery; adjuvant radiation; role of adjuvant systemic therapy including-chemotherapy antihormone therapy.   # Given multifocality-large size of the tumor; given BRCA 1 positive-agree with mastectomy with sentinel lymph node evaluation.  And also recommend contralateral prophylactic mastectomy. # Patient will likely not need radiation with mastectomy.  Patient declined any reconstruction.  # Given the clinical characteristics-T2/multifocality-possible invasion of the pectoralis-Ki-67 about 50%-and BRCA 1-patient likely will need systemic chemotherapy-most likely neoadjuvant.  Will check Oncotype-and also await MRI of the breast.  Patient understand that she will also need a port placement.  # GERD: stable.   # HTN:  stable.    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  # Remote history of lung cancer stage I-no concerns for any clinical recurrence.  # BRCA-1-prophylactic contralateral mastectomy.  Consider bilateral prophylactic oophorectomy down the line.  Thank you Dr.Linda Buren for allowing me to  participate in the care of your pleasant patient. Please do not hesitate to contact me with questions or concerns in the interim.  # DISPOSITION: # oncotype-  # labs- today- cbc/cmp/ca 27-29; CEA; Ca 15-3- please order # follow up in appx 3 weeks-MD: no labs-- Dr.B   Above plan of care was discussed with patient/family in detail.  My contact information was given to the patient/family.      Connie JONELLE Joe, MD 09/12/2023 12:19 PM

## 2023-09-13 ENCOUNTER — Ambulatory Visit

## 2023-09-13 DIAGNOSIS — C50411 Malignant neoplasm of upper-outer quadrant of right female breast: Secondary | ICD-10-CM

## 2023-09-13 DIAGNOSIS — Z170 Estrogen receptor positive status [ER+]: Secondary | ICD-10-CM

## 2023-09-13 LAB — CANCER ANTIGEN 27.29: CA 27.29: 16.2 U/mL (ref 0.0–38.6)

## 2023-09-13 LAB — CANCER ANTIGEN 15-3: CA 15-3: 15.3 U/mL (ref 0.0–25.0)

## 2023-09-13 LAB — CEA: CEA: 3.3 ng/mL (ref 0.0–4.7)

## 2023-09-13 NOTE — Progress Notes (Signed)
 Multidisciplinary Oncology Council Documentation  Connie Osborne was presented by our [facility] on 09/13/2023, which included representatives from:  Palliative Care Dietitian  Physical/Occupational Therapist Nurse Navigator Genetics Social work Survivorship RN Financial Navigator Research RN   [name] currently presents with history of breast cancer  We reviewed previous medical and familial history, history of present illness, and recent lab results along with all available histopathologic and imaging studies. The MOC considered available treatment options and made the following recommendations/referrals:  SW, rehab screening, nutrition  The MOC is a meeting of clinicians from various specialty areas who evaluate and discuss patients for whom a multidisciplinary approach is being considered. Final determinations in the plan of care are those of the provider(s).   Today's extended care, comprehensive team conference, Connie Osborne was not present for the discussion and was not examined.

## 2023-09-14 ENCOUNTER — Ambulatory Visit
Admission: RE | Admit: 2023-09-14 | Discharge: 2023-09-14 | Disposition: A | Discharge status: Home / Self Care | Source: Ambulatory Visit | Attending: General Surgery | Admitting: General Surgery

## 2023-09-14 DIAGNOSIS — C50211 Malignant neoplasm of upper-inner quadrant of right female breast: Secondary | ICD-10-CM | POA: Diagnosis present

## 2023-09-14 DIAGNOSIS — Z1501 Genetic susceptibility to malignant neoplasm of breast: Secondary | ICD-10-CM | POA: Diagnosis present

## 2023-09-14 DIAGNOSIS — Z1509 Genetic susceptibility to other malignant neoplasm: Secondary | ICD-10-CM | POA: Diagnosis present

## 2023-09-14 MED ORDER — GADOBUTROL 1 MMOL/ML IV SOLN
8.0000 mL | Freq: Once | INTRAVENOUS | Status: AC | PRN
  Administered 2023-09-14: 8 mL via INTRAVENOUS

## 2023-09-15 ENCOUNTER — Other Ambulatory Visit: Payer: Self-pay | Admitting: General Surgery

## 2023-09-15 DIAGNOSIS — R928 Other abnormal and inconclusive findings on diagnostic imaging of breast: Secondary | ICD-10-CM

## 2023-09-15 DIAGNOSIS — Z853 Personal history of malignant neoplasm of breast: Secondary | ICD-10-CM

## 2023-09-19 ENCOUNTER — Other Ambulatory Visit

## 2023-09-19 ENCOUNTER — Ambulatory Visit: Admitting: Internal Medicine

## 2023-09-20 ENCOUNTER — Ambulatory Visit
Admission: RE | Admit: 2023-09-20 | Discharge: 2023-09-20 | Disposition: A | Discharge status: Home / Self Care | Source: Ambulatory Visit | Attending: General Surgery | Admitting: General Surgery

## 2023-09-20 DIAGNOSIS — R590 Localized enlarged lymph nodes: Secondary | ICD-10-CM | POA: Insufficient documentation

## 2023-09-20 DIAGNOSIS — R928 Other abnormal and inconclusive findings on diagnostic imaging of breast: Secondary | ICD-10-CM

## 2023-09-20 DIAGNOSIS — Z853 Personal history of malignant neoplasm of breast: Secondary | ICD-10-CM | POA: Diagnosis not present

## 2023-09-20 DIAGNOSIS — N6331 Unspecified lump in axillary tail of the right breast: Secondary | ICD-10-CM | POA: Diagnosis not present

## 2023-09-20 DIAGNOSIS — R599 Enlarged lymph nodes, unspecified: Secondary | ICD-10-CM

## 2023-09-20 MED ORDER — LIDOCAINE-EPINEPHRINE 1 %-1:100000 IJ SOLN
5.0000 mL | Freq: Once | INTRAMUSCULAR | Status: AC
  Administered 2023-09-20: 5 mL
  Filled 2023-09-20: qty 5

## 2023-09-20 MED ORDER — LIDOCAINE 1 % OPTIME INJ - NO CHARGE
2.0000 mL | Freq: Once | INTRAMUSCULAR | Status: AC
  Administered 2023-09-20: 2 mL
  Filled 2023-09-20: qty 2

## 2023-09-21 LAB — SURGICAL PATHOLOGY

## 2023-09-25 ENCOUNTER — Encounter: Payer: Self-pay | Admitting: Internal Medicine

## 2023-09-25 ENCOUNTER — Encounter: Payer: Self-pay | Admitting: *Deleted

## 2023-09-25 NOTE — Progress Notes (Signed)
 Discussed the lymph node biopsy and oncotype results with Connie Osborne.   Dr. Rennie is recommending chemotherapy prior to surgery.   She will discuss this further at her appointment on 8/1.   Dr. Cesar is aware she will need port placement and his office will be calling her to set that up.

## 2023-09-28 ENCOUNTER — Ambulatory Visit: Payer: Self-pay | Admitting: General Surgery

## 2023-09-28 ENCOUNTER — Other Ambulatory Visit: Payer: Self-pay

## 2023-09-28 ENCOUNTER — Encounter
Admission: RE | Admit: 2023-09-28 | Discharge: 2023-09-28 | Disposition: A | Discharge status: Home / Self Care | Source: Ambulatory Visit | Attending: General Surgery | Admitting: General Surgery

## 2023-09-28 DIAGNOSIS — R0602 Shortness of breath: Secondary | ICD-10-CM

## 2023-09-28 HISTORY — DX: Prediabetes: R73.03

## 2023-09-28 HISTORY — DX: Anxiety disorder, unspecified: F41.9

## 2023-09-28 NOTE — Patient Instructions (Addendum)
 Your procedure is scheduled on: 10/02/23 - Monday Report to the Registration Desk on the 1st floor of the [facility]. To find out your arrival time, please call [phone] between 1PM - 3PM on: 09/29/23 - Friday If your arrival time is 6:00 am, do not arrive before that time as the [facility] entrance doors do not open until 6:00 am.  REMEMBER: Instructions that are not followed completely may result in serious medical risk, up to and including death; or upon the discretion of your surgeon and anesthesiologist your surgery may need to be rescheduled.  Do not eat food or drink any liquids after midnight the night before surgery.  No gum chewing or hard candies.  One week prior to surgery: Stop Anti-inflammatories (NSAIDS) such as Advil, Aleve, Ibuprofen, Motrin, Naproxen, Naprosyn and Aspirin based products such as Excedrin, Goody's Powder, BC Powder. You may take Tylenol  if needed for pain up until the day of surgery.  Stop ANY OVER THE COUNTER supplements until after surgery : CALCIUM + VITAMIN D3   ON THE DAY OF SURGERY ONLY TAKE THESE MEDICATIONS WITH SIPS OF WATER:  none   No Alcohol for 24 hours before or after surgery.  No Smoking including e-cigarettes for 24 hours before surgery.  No chewable tobacco products for at least 6 hours before surgery.  No nicotine  patches on the day of surgery.  Do not use any recreational drugs for at least a week (preferably 2 weeks) before your surgery.  Please be advised that the combination of cocaine and anesthesia may have negative outcomes, up to and including death. If you test positive for cocaine, your surgery will be cancelled.  On the morning of surgery brush your teeth with toothpaste and water, you may rinse your mouth with mouthwash if you wish. Do not swallow any toothpaste or mouthwash.  Use CHG Soap or wipes as directed on instruction sheet.  Do not wear jewelry, make-up, hairpins, clips or nail polish.  For welded  (permanent) jewelry: bracelets, anklets, waist bands, etc.  Please have this removed prior to surgery.  If it is not removed, there is a chance that hospital personnel will need to cut it off on the day of surgery.  Do not wear lotions, powders, or perfumes.   Do not shave body hair from the neck down 48 hours before surgery.  Contact lenses, hearing aids and dentures may not be worn into surgery.  Do not bring valuables to the hospital. [facility] is not responsible for any missing/lost belongings or valuables.   Notify your doctor if there is any change in your medical condition (cold, fever, infection).  Wear comfortable clothing (specific to your surgery type) to the hospital.  After surgery, you can help prevent lung complications by doing breathing exercises.  Take deep breaths and cough every 1-2 hours. Your doctor may order a device called an Incentive Spirometer to help you take deep breaths.  When coughing or sneezing, hold a pillow firmly against your incision with both hands. This is called "splinting." Doing this helps protect your incision. It also decreases belly discomfort.  If you are being admitted to the hospital overnight, leave your suitcase in the car. After surgery it may be brought to your room.  In case of increased patient census, it may be necessary for you, the patient, to continue your postoperative care in the Same Day Surgery department.  If you are being discharged the day of surgery, you will not be allowed to  drive home. You will need a responsible individual to drive you home and stay with you for 24 hours after surgery.   If you are taking public transportation, you will need to have a responsible individual with you.  Please call the Pre-admissions Testing Dept. at [phone] if you have any questions about these instructions.  Surgery Visitation Policy:  Patients having surgery or a procedure may have two visitors.  Children under the age of  54 must have an adult with them who is not the patient.  Inpatient Visitation:    Visiting hours are 7 a.m. to 8 p.m. Up to four visitors are allowed at one time in a patient room. The visitors may rotate out with other people during the day.  One visitor age 67 or older may stay with the patient overnight and must be in the room by 8 p.m.   Merchandiser, retail to address health-related social needs:  [email or web address]     Preparing for Surgery with CHLORHEXIDINE  GLUCONATE (CHG) Soap  Chlorhexidine  Gluconate (CHG) Soap  o An antiseptic cleaner that kills germs and bonds with the skin to continue killing germs even after washing  o Used for showering the night before surgery and morning of surgery  Before surgery, you can play an important role by reducing the number of germs on your skin.  CHG (Chlorhexidine  gluconate) soap is an antiseptic cleanser which kills germs and bonds with the skin to continue killing germs even after washing.  Please do not use if you have an allergy to CHG or antibacterial soaps. If your skin becomes reddened/irritated stop using the CHG.  1. Shower the NIGHT BEFORE SURGERY and the MORNING OF SURGERY with CHG soap.  2. If you choose to wash your hair, wash your hair first as usual with your normal shampoo.  3. After shampooing, rinse your hair and body thoroughly to remove the shampoo.  4. Use CHG as you would any other liquid soap. You can apply CHG directly to the skin and wash gently with a scrungie or a clean washcloth.  5. Apply the CHG soap to your body only from the neck down. Do not use on open wounds or open sores. Avoid contact with your eyes, ears, mouth, and genitals (private parts). Wash face and genitals (private parts) with your normal soap.  6. Wash thoroughly, paying special attention to the area where your surgery will be performed.  7. Thoroughly rinse your body with warm water.  8. Do not shower/wash with  your normal soap after using and rinsing off the CHG soap.  9. Pat yourself dry with a clean towel.  10. Wear clean pajamas to bed the night before surgery.  12. Place clean sheets on your bed the night of your first shower and do not sleep with pets.  13. Shower again with the CHG soap on the day of surgery prior to arriving at the hospital.  14. Do not apply any deodorants/lotions/powders.  15. Please wear clean clothes to the hospital.

## 2023-09-29 ENCOUNTER — Encounter: Payer: Self-pay | Admitting: Internal Medicine

## 2023-09-29 ENCOUNTER — Inpatient Hospital Stay: Attending: Internal Medicine | Admitting: Internal Medicine

## 2023-09-29 ENCOUNTER — Telehealth: Payer: Self-pay

## 2023-09-29 ENCOUNTER — Encounter
Admission: RE | Admit: 2023-09-29 | Discharge: 2023-09-29 | Disposition: A | Discharge status: Home / Self Care | Source: Ambulatory Visit | Attending: General Surgery | Admitting: General Surgery

## 2023-09-29 ENCOUNTER — Encounter: Payer: Self-pay | Admitting: *Deleted

## 2023-09-29 VITALS — BP 122/68 | HR 73 | Temp 97.4°F | Resp 18 | Ht 65.0 in | Wt 177.5 lb

## 2023-09-29 DIAGNOSIS — Z923 Personal history of irradiation: Secondary | ICD-10-CM | POA: Diagnosis not present

## 2023-09-29 DIAGNOSIS — Z801 Family history of malignant neoplasm of trachea, bronchus and lung: Secondary | ICD-10-CM | POA: Diagnosis not present

## 2023-09-29 DIAGNOSIS — Z01818 Encounter for other preprocedural examination: Secondary | ICD-10-CM | POA: Insufficient documentation

## 2023-09-29 DIAGNOSIS — C773 Secondary and unspecified malignant neoplasm of axilla and upper limb lymph nodes: Secondary | ICD-10-CM | POA: Insufficient documentation

## 2023-09-29 DIAGNOSIS — Z170 Estrogen receptor positive status [ER+]: Secondary | ICD-10-CM | POA: Diagnosis not present

## 2023-09-29 DIAGNOSIS — Z79630 Long term (current) use of alkylating agent: Secondary | ICD-10-CM | POA: Diagnosis not present

## 2023-09-29 DIAGNOSIS — C50211 Malignant neoplasm of upper-inner quadrant of right female breast: Secondary | ICD-10-CM | POA: Diagnosis present

## 2023-09-29 DIAGNOSIS — Z5111 Encounter for antineoplastic chemotherapy: Secondary | ICD-10-CM | POA: Insufficient documentation

## 2023-09-29 DIAGNOSIS — R7303 Prediabetes: Secondary | ICD-10-CM | POA: Diagnosis not present

## 2023-09-29 DIAGNOSIS — Z79633 Long term (current) use of mitotic inhibitor: Secondary | ICD-10-CM | POA: Diagnosis not present

## 2023-09-29 DIAGNOSIS — Z1501 Genetic susceptibility to malignant neoplasm of breast: Secondary | ICD-10-CM | POA: Insufficient documentation

## 2023-09-29 DIAGNOSIS — I10 Essential (primary) hypertension: Secondary | ICD-10-CM | POA: Diagnosis not present

## 2023-09-29 DIAGNOSIS — K219 Gastro-esophageal reflux disease without esophagitis: Secondary | ICD-10-CM | POA: Insufficient documentation

## 2023-09-29 DIAGNOSIS — Z87891 Personal history of nicotine dependence: Secondary | ICD-10-CM | POA: Diagnosis not present

## 2023-09-29 DIAGNOSIS — Z85118 Personal history of other malignant neoplasm of bronchus and lung: Secondary | ICD-10-CM | POA: Diagnosis not present

## 2023-09-29 DIAGNOSIS — Z902 Acquired absence of lung [part of]: Secondary | ICD-10-CM | POA: Diagnosis not present

## 2023-09-29 DIAGNOSIS — Z1509 Genetic susceptibility to other malignant neoplasm: Secondary | ICD-10-CM | POA: Diagnosis not present

## 2023-09-29 DIAGNOSIS — Z803 Family history of malignant neoplasm of breast: Secondary | ICD-10-CM | POA: Diagnosis not present

## 2023-09-29 DIAGNOSIS — C50411 Malignant neoplasm of upper-outer quadrant of right female breast: Secondary | ICD-10-CM | POA: Insufficient documentation

## 2023-09-29 DIAGNOSIS — Z8249 Family history of ischemic heart disease and other diseases of the circulatory system: Secondary | ICD-10-CM | POA: Diagnosis not present

## 2023-09-29 DIAGNOSIS — Z79899 Other long term (current) drug therapy: Secondary | ICD-10-CM | POA: Diagnosis not present

## 2023-09-29 DIAGNOSIS — J449 Chronic obstructive pulmonary disease, unspecified: Secondary | ICD-10-CM | POA: Diagnosis not present

## 2023-09-29 LAB — BASIC METABOLIC PANEL WITH GFR
Anion gap: 10 (ref 5–15)
BUN: 10 mg/dL (ref 8–23)
CO2: 26 mmol/L (ref 22–32)
Calcium: 9.8 mg/dL (ref 8.9–10.3)
Chloride: 105 mmol/L (ref 98–111)
Creatinine, Ser: 0.73 mg/dL (ref 0.44–1.00)
GFR, Estimated: >60 mL/min (ref >60)
Glucose, Bld: 115 mg/dL — ABNORMAL HIGH (ref 70–99)
Potassium: 3.4 mmol/L — ABNORMAL LOW (ref 3.5–5.1)
Sodium: 141 mmol/L (ref 135–145)

## 2023-09-29 MED ORDER — LIDOCAINE-PRILOCAINE 2.5-2.5 % EX CREA
TOPICAL_CREAM | CUTANEOUS | 3 refills | Status: AC
Start: 2023-09-29 — End: ?

## 2023-09-29 MED ORDER — PROCHLORPERAZINE MALEATE 10 MG PO TABS: 10.0000 mg | ORAL_TABLET | Freq: Four times a day (QID) | ORAL | 0 refills | Status: AC | PRN

## 2023-09-29 MED ORDER — ONDANSETRON HCL 8 MG PO TABS: ORAL_TABLET | ORAL | 1 refills | Status: AC

## 2023-09-29 NOTE — Progress Notes (Addendum)
 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   # RIGHT BREAST T2; intramammary lymph node positive- N-0- ER-PR POSITIVE: her 2 neg; Ki-67-50%  # Colonoscopy [Dr.sankar-Neg June 2017]  # Smoker; BRCA-1 positive   Primary cancer of right upper lobe of lung (HCC)  10/26/2015 Initial Diagnosis   Primary cancer of right upper lobe of lung (HCC)   Carcinoma of upper-outer quadrant of right breast in female, estrogen receptor positive (HCC)  09/12/2023 Initial Diagnosis   Carcinoma of upper-outer quadrant of right breast in female, estrogen receptor positive (HCC)   09/12/2023 Cancer Staging   Staging form: Breast, AJCC 8th Edition - Clinical: Stage IIA (cT2, cN1, cM0, G2, ER+, PR+, HER2-) - Signed by Rennie Cindy SAUNDERS, MD on 09/29/2023 Histologic grading system: 3 grade system   10/11/2023 -  Chemotherapy   Patient is on Treatment Plan : BREAST Paclitaxel q7d / AC q21d       HISTORY OF PRESENTING ILLNESS: Patient ambulating-independently. Alone.   Terence Googe 63 y.o.  female pleasant patient with BRCA-1- left breast DCIS; and remote history of stage I lung cancer s/p surgery- currently dx with right breast cancer is here to dis/p right axillary LN biopsy is here for a follow up.  Patient reports she's feeling ok, but having some soreness from breast biopsy that she says is a level 2 on a  pain scale from 1-10. Other than that, she has no new or acute concerns at this time.  Patient denies any unusual shortness of breath or bone pain.    Review of Systems  Constitutional:  Positive for malaise/fatigue. Negative for chills, diaphoresis, fever and weight loss.  HENT:  Negative for nosebleeds and sore throat.   Eyes:  Negative for double vision.  Respiratory:  Negative for cough, hemoptysis, sputum production, shortness of breath and wheezing.   Cardiovascular:  Negative for chest pain, palpitations, orthopnea and leg swelling.  Gastrointestinal:  Negative for abdominal pain, blood in stool, constipation, diarrhea, heartburn, melena, nausea and vomiting.  Genitourinary:  Negative for dysuria, frequency and urgency.  Musculoskeletal:  Positive for back pain and joint pain.  Skin: Negative.  Negative for itching and rash.  Neurological:  Negative for dizziness, tingling, focal weakness, weakness and headaches.  Endo/Heme/Allergies:  Does not bruise/bleed easily.  Psychiatric/Behavioral:  Negative for depression. The patient is not nervous/anxious and does not have insomnia.     MEDICAL HISTORY:  Past Medical History:  Diagnosis Date   Allergy    Anxiety    Arthritis    Breast cancer (HCC) 2014   Left- Radiation; BRCA 1 +    COPD (chronic obstructive pulmonary disease) (HCC)    Coughing up blood    GERD (gastroesophageal reflux disease)    Hypertension    Lung cancer (HCC)    Carcinoma, right upper lobe    Personal history of radiation therapy    Pre-diabetes    Shortness of breath dyspnea  Vitamin D deficiency     SURGICAL HISTORY: Past Surgical History:  Procedure Laterality Date   ABDOMINAL HYSTERECTOMY     BREAST BIOPSY Left 2014   +   BREAST BIOPSY Left 06/11/2019   stereo bx, x-clip, negative   BREAST BIOPSY Right 08/30/2023   US  RT BREAST BX W LOC DEV 1ST LESION IMG BX SPEC US  GUIDE 08/30/2023 [facility]   BREAST BIOPSY Right 08/30/2023   US  RT  BREAST BX W LOC DEV EA ADD LESION IMG BX SPEC US  GUIDE 08/30/2023 [facility]   BREAST EXCISIONAL BIOPSY Left 2014   BREAST LUMPECTOMY Left 2014   BREAST SURGERY Left 2014   lumpectomy   COLONOSCOPY WITH PROPOFOL  N/A 08/18/2015   Procedure: COLONOSCOPY WITH PROPOFOL ;  Surgeon: Louanne KANDICE Muse, MD;  Location: [facility];  Service: Endoscopy;  Laterality: N/A;   COLONOSCOPY WITH PROPOFOL  N/A 10/12/2016   Procedure: COLONOSCOPY WITH PROPOFOL ;  Surgeon: Muse Louanne KANDICE, MD;  Location: [facility];  Service: Endoscopy;  Laterality: N/A;   ELECTROMAGNETIC NAVIGATION BROCHOSCOPY Right 07/28/2015   Procedure: ELECTROMAGNETIC NAVIGATION BRONCHOSCOPY;  Surgeon: Nickolas Cellar, MD;  Location: [facility];  Service: Cardiopulmonary;  Laterality: Right;   FOOT SURGERY     FRACTURE SURGERY     GANGLION CYST EXCISION     THORACOTOMY/LOBECTOMY Right 09/14/2015   Procedure: THORACOTOMY/LOBECTOMY;  Surgeon: Louanne KANDICE Muse, MD;  Location: [facility];  Service: Thoracic;  Laterality: Right;    SOCIAL HISTORY: Social History   Socioeconomic History   Marital status: Single    Spouse name: Not on file   Number of children: Not on file   Years of education: Not on file   Highest education level: Not on file  Occupational History   Not on file  Tobacco Use   Smoking status: Former    Current packs/day: 0.00    Average packs/day: 1 pack/day for 30.0 years (30.0 ttl pk-yrs)    Types: Cigarettes    Start date: 07/29/1985    Quit date: 07/30/2015    Years since quitting: 8.1   Smokeless tobacco: Never  Vaping Use   Vaping status: Never Used  Substance and Sexual Activity   Alcohol use: No    Alcohol/week: 0.0 standard drinks of alcohol   Drug use: No   Sexual activity: Not on file  Other Topics Concern   Not on file  Social History Narrative   Lives alone   Social Drivers of Health   Financial Resource Strain: Medium Risk (09/07/2023)   Received [facility]   Overall Financial Resource Strain (CARDIA)    Difficulty of Paying Living Expenses: Somewhat hard  Food Insecurity: No Food Insecurity (09/12/2023)   Hunger Vital Sign    Worried About Running Out of Food in the Last Year: Never true    Ran Out of Food in the Last Year: Never true  Recent Concern: Food Insecurity - Food Insecurity Present (09/07/2023)   Received from [facility]   Hunger Vital Sign    Within the past 12 months, you worried that your food would run out before you got the money to buy more.: Sometimes true    Within the past 12 months, the food you bought just didn't last and you didn't have money to get more.: Never true  Transportation Needs: No Transportation Needs (09/12/2023)   PRAPARE - Administrator, Civil Service (Medical): No    Lack of Transportation (Non-Medical): No  Physical Activity: Not  on file  Stress: Not on file  Social Connections: Not on file  Intimate Partner Violence: Not At Risk (09/12/2023)   Humiliation, Afraid, Rape, and Kick questionnaire    Fear of Current or Ex-Partner: No    Emotionally Abused: No    Physically Abused: No    Sexually Abused: No    FAMILY HISTORY: Family History  Problem Relation Age of Onset   Hypertension Mother    Diabetes Mellitus II Mother    Cancer Mother        mets   Cancer Father        long cancer   Cancer Sister 22       breast   Breast cancer Sister 7   Cancer Sister 95       breast   Breast cancer Sister 41   Lung cancer Brother    Cancer Other        breast    ALLERGIES:  is allergic to accupril [quinapril hcl] and percocet [oxycodone -acetaminophen ].  MEDICATIONS:  Current Outpatient Medications  Medication Sig Dispense Refill   lidocaine -prilocaine (EMLA) cream Apply on the port. 30 -45 min  prior to port access. 30 g 3   ondansetron  (ZOFRAN ) 8 MG tablet One pill every 8 hours as needed for nausea/vomitting. 40 tablet 1   prochlorperazine (COMPAZINE) 10  MG tablet Take 1 tablet (10 mg total) by mouth every 6 (six) hours as needed for nausea or vomiting. 30 tablet 0   amLODipine  (NORVASC ) 10 MG tablet Take 10 mg by mouth daily with lunch.      atorvastatin (LIPITOR) 40 MG tablet Take 40 mg by mouth daily.     Calcium Carb-Cholecalciferol (CALCIUM + VITAMIN D3 PO) Take 1 tablet by mouth in the morning and at bedtime.     cetirizine (ZYRTEC) 10 MG tablet Take 10 mg by mouth as needed for allergies.     famotidine  (PEPCID ) 20 MG tablet Take 20 mg by mouth daily as needed for heartburn or indigestion.     losartan  (COZAAR ) 100 MG tablet Take 100 mg by mouth daily with lunch.      metoprolol  succinate (TOPROL -XL) 25 MG 24 hr tablet Take 25 mg by mouth daily with lunch.      Potassium Chloride  ER 20 MEQ TBCR Take 1 tablet by mouth daily.     No current facility-administered medications for this visit.    PHYSICAL EXAMINATION:   Vitals:   09/29/23 0827  BP: 122/68  Pulse: 73  Resp: 18  Temp: (!) 97.4 F (36.3 C)  SpO2: 100%   Filed Weights   09/29/23 0827  Weight: 177 lb 8 oz (80.5 kg)   Right breast 12 o'clock position-approximately 3 to 4 cm mass-slightly mobile.  No skin changes no nipple changes.  No underarm lymphadenopathy.  Physical Exam Vitals and nursing note reviewed.  HENT:     Head: Normocephalic and atraumatic.     Mouth/Throat:     Pharynx: Oropharynx is clear.  Eyes:     Extraocular Movements: Extraocular movements intact.     Pupils: Pupils are equal, round, and reactive to light.  Cardiovascular:     Rate and Rhythm: Normal rate and regular rhythm.  Pulmonary:     Comments: Decreased breath sounds bilaterally.  Abdominal:     Palpations: Abdomen is soft.  Musculoskeletal:        General: Normal range of motion.     Cervical back: Normal range of motion.  Skin:  General: Skin is warm.  Neurological:     General: No focal deficit present.     Mental Status: She is alert and oriented to person, place, and  time.  Psychiatric:        Behavior: Behavior normal.        Judgment: Judgment normal.     LABORATORY DATA:  I have reviewed the data as listed Lab Results  Component Value Date   WBC 8.4 09/12/2023   HGB 11.8 (L) 09/12/2023   HCT 34.8 (L) 09/12/2023   MCV 89.7 09/12/2023   PLT 340 09/12/2023   Recent Labs    09/12/23 1217  NA 143  K 3.2*  CL 107  CO2 26  GLUCOSE 156*  BUN 19  CREATININE 0.59  CALCIUM 10.1  GFRNONAA >60  PROT 7.4  ALBUMIN 3.7  AST 18  ALT 15  ALKPHOS 98  BILITOT 0.3    RADIOGRAPHIC STUDIES: I have personally reviewed the radiological images as listed and agreed with the findings in the report. US  AXILLARY NODE CORE BIOPSY RIGHT Addendum Date: 09/22/2023 ADDENDUM REPORT: 09/22/2023 08:56 ADDENDUM: PATHOLOGY revealed: 1. Lymph node, needle/core biopsy, right axilla, butterfly clip - ONE LYMPH NODE, POSITIVE FOR METASTATIC CARCINOMA (1/1) - METASTATIC FOCUS: 4 MM - SUSPICIOUS FOR EXTRANODAL EXTENSION. Pathology results are CONCORDANT with imaging findings, per Dr. Norleen Croak. Pathology results and recommendations were discussed with patient via telephone on 09/22/2023. Patient reported biopsy site doing well with no adverse symptoms, and slight tenderness and bruising at the site. Post biopsy care instructions were reviewed, questions were answered and my direct phone number was provided. Patient was instructed to call [facility] for any additional questions or concerns related to biopsy site. Recommendation: The patient has a recent diagnosis of RIGHT breast cancer and should follow her outlined treatment plan. The patient's care team was notified of biopsy results via secure EPIC message on September 22, 2023. Pathology results reported by Hendricks Benders, RN on 09/21/2023. Electronically Signed   By: Norleen Croak M.D.   On: 09/22/2023 08:56   Result Date: 09/22/2023 CLINICAL DATA:  Enlarged RIGHT axillary lymph node in the setting of known RIGHT breast  cancer EXAM: US  AXILLARY NODE CORE BIOPSY RIGHT COMPARISON:  Previous exam(s). PROCEDURE: I met with the patient and we discussed the procedure of ultrasound-guided biopsy, including benefits and alternatives. We discussed the high likelihood of a successful procedure. We discussed the risks of the procedure, including infection, bleeding, tissue injury, clip migration, and inadequate sampling. Informed written consent was given. The usual time-out protocol was performed immediately prior to the procedure. Using sterile technique and 1% lidocaine  and 1% lidocaine  with epinephrine  as local anesthetic, under direct ultrasound visualization, a 14 gauge spring-loaded device was used to perform biopsy of the enlarged level 1 RIGHT axillary lymph node using a LATERAL approach. At the conclusion of the procedure a butterfly HydroMARK tissue marker clip was deployed into the biopsy cavity. Follow up 2 view mammogram was performed and dictated separately. IMPRESSION: Ultrasound guided biopsy of an enlarged RIGHT axillary lymph node. No apparent complications. Electronically Signed: By: Norleen Croak M.D. On: 09/20/2023 08:50   MM CLIP PLACEMENT RIGHT Result Date: 09/20/2023 CLINICAL DATA:  Status post same day ultrasound biopsy of a level 1 axillary lymph node EXAM: 3D DIAGNOSTIC RIGHT MAMMOGRAM POST ULTRASOUND BIOPSY COMPARISON:  Previous exam(s). ACR Breast Density Category b: There are scattered areas of fibroglandular density. FINDINGS: 3D Mammographic images were obtained following ultrasound guided biopsy of  an enlarged level 1 RIGHT axillary lymph node. The biopsy marking clip is in expected position at the site of biopsy. Known RIGHT breast cancer and prominent intramammary lymph nodes are again seen, including biopsy-proven intramammary nodal metastasis. IMPRESSION: Appropriate positioning of the butterfly HydroMARK shaped biopsy marking clip at the site of biopsy in the RIGHT axilla. Final Assessment: Post  Procedure Mammograms for Marker Placement Electronically Signed   By: Norleen Croak M.D.   On: 09/20/2023 08:52   US  AXILLA RIGHT Result Date: 09/20/2023 CLINICAL DATA:  Known RIGHT breast cancer with intramammary nodal metastases. Multiple suspicious intramammary nodes on MRI, with a single enlarged RIGHT axillary lymph node. Presents for biopsy of the axillary lymph node. EXAM: ULTRASOUND OF THE RIGHT AXILLA COMPARISON:  MRI dated September 14, 2023 FINDINGS: Ultrasound is performed, showing multiple normal lymph nodes with a single lobulated level 1 lymph node anteriorly within eccentric Leigh thickened cortex which is best demonstrated on the anti radial cine clip and on pre biopsy images (separately performed and dictated). Its cortex measures up to 6 mm. A few prominent intramammary lymph nodes were incidentally noted in imaged, correlating with the prominent intramammary lymph nodes described as suspicious for malignancy on recent MRI. IMPRESSION: RIGHT level 1 axillary lymph node with eccentrically thickened cortex, correlating with the finding on MRI. Ultrasound-guided biopsy was subsequently performed and is separately dictated. RECOMMENDATION: Ultrasound-guided biopsy of the RIGHT axillary lymph node, performed immediately following this examination. I have discussed the findings and recommendations with the patient. If applicable, a reminder letter will be sent to the patient regarding the next appointment. BI-RADS CATEGORY  4: Suspicious. Electronically Signed   By: Norleen Croak M.D.   On: 09/20/2023 08:48   MR BREAST BILATERAL W WO CONTRAST INC CAD Result Date: 09/15/2023 CLINICAL DATA:  Staging for recently diagnosed right breast invasive carcinoma. Patient presented with a palpable right breast mass for several months. She has a history of left breast carcinoma 2014, was followed for benign dystrophic calcifications in the lumpectomy bed and reportedly is BRCA1 gene mutation positive. Patient was  found to have a 3.7 cm mass in the right breast on diagnostic imaging on 08/24/2023 as well as subcentimeter lymph nodes, which had increased in size since prior exams. Showing a biopsy of the right breast mass as well as an intramammary lymph node, both positive for carcinoma. EXAM: BILATERAL BREAST MRI WITH AND WITHOUT CONTRAST TECHNIQUE: Multiplanar, multisequence MR images of both breasts were obtained prior to and following the intravenous administration of 8 ml of Gadavist  Three-dimensional MR images were rendered by post-processing of the original MR data on an independent workstation. The three-dimensional MR images were interpreted, and findings are reported in the following complete MRI report for this study. Three dimensional images were evaluated at the independent interpreting workstation using the DynaCAD thin client. COMPARISON:  Prior exams.  No previous breast MRIs. FINDINGS: Breast composition: b. Scattered fibroglandular tissue. Background parenchymal enhancement: Mild Right breast: Large mass lies in the posterior aspect of the right breast, upper inner quadrant, heterogeneously enhancing, and containing areas of low T2 signal that do not enhance suspected to be post biopsy hemorrhage. Mass also contains a focus of susceptibility artifact consistent with the post biopsy marker clip. The mass measures 3.8 x 2.9 x 3.6 cm. The posterior aspect of the mass is in close proximity to the underlying pectoralis major muscle, but there is no evidence pectoralis invasion. Specifically, there is no abnormal T2 signal or enhancement within the  underlying pectoralis. Prominent susceptibility artifact from the coil shaped post biopsy marker clip is identified in the upper outer breast, middle depth, corresponding to the biopsy-proven metastatic intramammary lymph node. Posterior and superior to this is a second subcentimeter, but rounded intramammary lymph node. In the more inferior aspect of the lateral breast  there are 2 small round enhancing masses consistent with additional lymph nodes noted on the recent diagnostic mammography and ultrasound, largest of these 4 mm. In the anterior aspect of the right axilla there is a lymph node with eccentric cortical thickening between 4 and 5 mm. There are no other masses or areas of abnormal enhancement. Left breast: No mass or abnormal enhancement. Post lumpectomy changes lie in the lateral breast. Lymph nodes: Single abnormal anterior right axillary lymph node with eccentric cortical thickening to 4-5 mm as detailed above. No other evidence of axillary lymphadenopathy. Ancillary findings:  None. IMPRESSION: 1. Biopsy-proven right breast malignancy measuring 3.8 cm in greatest dimension, closely approximating the underlying pectoralis major muscle, but without evidence of pectoralis invasion. 2. Biopsy-proven metastatic subcentimeter right upper outer quadrant lymph node. There are 3 additional small rounded enhancing lymph nodes in the lateral right breast as detailed above, which may reflect additional metastatic disease. 3. Single abnormal anterior right axilla lymph node with an eccentric cortical thickening of 4-5 mm, suspicious for metastatic disease. 4. No evidence of left breast malignancy. Benign post lumpectomy changes on the left. RECOMMENDATION: 1. Treatment as planned for the known right breast malignancy. 2. Consider follow-up targeted ultrasound of the anterior right axilla and biopsy of the node with eccentric cortical thickening, if this would alter treatment. BI-RADS CATEGORY  6: Known biopsy-proven malignancy. Electronically Signed   By: Alm Parkins M.D.   On: 09/15/2023 08:04   US  RT BREAST BX W LOC DEV EA ADD LESION IMG BX SPEC US  GUIDE Addendum Date: 09/05/2023 ADDENDUM REPORT: 09/05/2023 14:00 ADDENDUM: PATHOLOGY revealed: Site 1. Breast, right, needle core biopsy, 1:00; 12 cmfn : - INVASIVE DUCTAL CARCINOMA WITH PAPILLARY FEATURES - OVERALL GRADE: 2 -  LYMPHOVASCULAR INVASION: PRESENT - CANCER LENGTH: 1.5 CM - CALCIFICATIONS: NOT IDENTIFIED. Pathology results are CONCORDANT with imaging findings, per Dirk Arrant M.D. PATHOLOGY revealed: Site 2. Breast, right, needle core biopsy, 10:00 10 cmfn : - POSITIVE FOR METASTATIC CARCINOMA. Diagnosis Note : The 2 metastatic foci are approximately 0.5 mm and are best classified as micrometastasis on the needle core biopsy specimen. Pathology results are CONCORDANT with imaging findings, per Dirk Arrant M.D. Pathology results and recommendations below were discussed with patient by telephone on 09/04/2023 by Rock Hover RN. Patient reported biopsy site within normal limits with slight tenderness at the site. Post biopsy care instructions were reviewed, questions were answered and my direct phone number was provided to patient. Patient was instructed to call [facility] if any concerns or questions arise related to the biopsy. RECOMMENDATIONS: 1. Surgical and oncological consultation. Request for surgical and oncological consultation relayed to Shasta Ada RN at [facility] by Rock Hover RN on 09/04/2023. 2. Recommend bilateral breast MRI with and without contrast to assess for pectoralis invasion by primary mass. Pathology results reported by Rock Hover RN on 09/05/2023. Electronically Signed   By: Dirk Arrant M.D.   On: 09/05/2023 14:00   Result Date: 09/05/2023 CLINICAL DATA:  63 year old female presenting for ultrasound-guided biopsy of a suspicious palpable mass in the RIGHT breast and an enlarged intramammary lymph node in the RIGHT breast. EXAM: ULTRASOUND GUIDED RIGHT  BREAST CORE NEEDLE BIOPSY COMPARISON:  Previous exam(s). PROCEDURE: I met with the patient and we discussed the procedure of ultrasound-guided biopsy, including benefits and alternatives. We discussed the high likelihood of a successful procedure. We discussed the risks of the procedure, including infection, bleeding,  tissue injury, clip migration, and inadequate sampling. Informed written consent was given. The usual time-out protocol was performed immediately prior to the procedure. SITE 1: RIGHT breast mass 1 o'clock 12 cm from the nipple (RIBBON clip) Lesion quadrant: Upper inner quadrant Using sterile technique and 1% Lidocaine  as local anesthetic, under direct ultrasound visualization, a 14 gauge spring-loaded device was used to perform biopsy of a mass in the right breast 1 o'clock position 12 cm from the nipple using a lateral approach. At the conclusion of the procedure, a ribbon shaped tissue marker clip was deployed into the biopsy cavity. SITE 2: RIGHT breast intramammary lymph node 10 o'clock 10 cm from nipple (COIL clip) Lesion quadrant: Upper outer quadrant Using sterile technique and 1% Lidocaine  as local anesthetic, under direct ultrasound visualization, a 14 gauge spring-loaded device was used to perform biopsy of an intramammary lymph node in the right breast 10 o'clock position 10 cm from the nipple using a lateral approach. At the conclusion of the procedure, a coil shaped tissue marker clip was deployed into the biopsy cavity. Follow up 2 view mammogram was performed and dictated separately. IMPRESSION: 1. Ultrasound guided biopsy of a suspicious mass in the RIGHT breast 1 o'clock position. No apparent complications. 2. Ultrasound guided biopsy of a prominent intramammary lymph node in the RIGHT breast 10 o'clock position. No apparent complications. Electronically Signed: By: Dirk Arrant M.D. On: 08/30/2023 13:55   US  RT BREAST BX W LOC DEV 1ST LESION IMG BX SPEC US  GUIDE Addendum Date: 09/05/2023 ADDENDUM REPORT: 09/05/2023 14:00 ADDENDUM: PATHOLOGY revealed: Site 1. Breast, right, needle core biopsy, 1:00; 12 cmfn : - INVASIVE DUCTAL CARCINOMA WITH PAPILLARY FEATURES - OVERALL GRADE: 2 - LYMPHOVASCULAR INVASION: PRESENT - CANCER LENGTH: 1.5 CM - CALCIFICATIONS: NOT IDENTIFIED. Pathology results are  CONCORDANT with imaging findings, per Dirk Arrant M.D. PATHOLOGY revealed: Site 2. Breast, right, needle core biopsy, 10:00 10 cmfn : - POSITIVE FOR METASTATIC CARCINOMA. Diagnosis Note : The 2 metastatic foci are approximately 0.5 mm and are best classified as micrometastasis on the needle core biopsy specimen. Pathology results are CONCORDANT with imaging findings, per Dirk Arrant M.D. Pathology results and recommendations below were discussed with patient by telephone on 09/04/2023 by Rock Hover RN. Patient reported biopsy site within normal limits with slight tenderness at the site. Post biopsy care instructions were reviewed, questions were answered and my direct phone number was provided to patient. Patient was instructed to call [facility] [facility] if any concerns or questions arise related to the biopsy. RECOMMENDATIONS: 1. Surgical and oncological consultation. Request for surgical and oncological consultation relayed to Shasta Ada RN at [facility] by Rock Hover RN on 09/04/2023. 2. Recommend bilateral breast MRI with and without contrast to assess for pectoralis invasion by primary mass. Pathology results reported by Rock Hover RN on 09/05/2023. Electronically Signed   By: Dirk Arrant M.D.   On: 09/05/2023 14:00   Result Date: 09/05/2023 CLINICAL DATA:  63 year old female presenting for ultrasound-guided biopsy of a suspicious palpable mass in the RIGHT breast and an enlarged intramammary lymph node in the RIGHT breast. EXAM: ULTRASOUND GUIDED RIGHT BREAST CORE NEEDLE BIOPSY COMPARISON:  Previous exam(s). PROCEDURE: I met with the patient and  we discussed the procedure of ultrasound-guided biopsy, including benefits and alternatives. We discussed the high likelihood of a successful procedure. We discussed the risks of the procedure, including infection, bleeding, tissue injury, clip migration, and inadequate sampling. Informed written consent was given. The usual time-out  protocol was performed immediately prior to the procedure. SITE 1: RIGHT breast mass 1 o'clock 12 cm from the nipple (RIBBON clip) Lesion quadrant: Upper inner quadrant Using sterile technique and 1% Lidocaine  as local anesthetic, under direct ultrasound visualization, a 14 gauge spring-loaded device was used to perform biopsy of a mass in the right breast 1 o'clock position 12 cm from the nipple using a lateral approach. At the conclusion of the procedure, a ribbon shaped tissue marker clip was deployed into the biopsy cavity. SITE 2: RIGHT breast intramammary lymph node 10 o'clock 10 cm from nipple (COIL clip) Lesion quadrant: Upper outer quadrant Using sterile technique and 1% Lidocaine  as local anesthetic, under direct ultrasound visualization, a 14 gauge spring-loaded device was used to perform biopsy of an intramammary lymph node in the right breast 10 o'clock position 10 cm from the nipple using a lateral approach. At the conclusion of the procedure, a coil shaped tissue marker clip was deployed into the biopsy cavity. Follow up 2 view mammogram was performed and dictated separately. IMPRESSION: 1. Ultrasound guided biopsy of a suspicious mass in the RIGHT breast 1 o'clock position. No apparent complications. 2. Ultrasound guided biopsy of a prominent intramammary lymph node in the RIGHT breast 10 o'clock position. No apparent complications. Electronically Signed: By: Dirk Arrant M.D. On: 08/30/2023 13:55   MM CLIP PLACEMENT RIGHT Result Date: 08/30/2023 CLINICAL DATA:  Status post ultrasound-guided biopsy of a suspicious palpable mass in the RIGHT breast and a prominent RIGHT intramammary lymph node. EXAM: 3D DIAGNOSTIC RIGHT MAMMOGRAM POST ULTRASOUND BIOPSY COMPARISON:  Previous exam(s). ACR Breast Density Category b: There are scattered areas of fibroglandular density. FINDINGS: 3D Mammographic images were obtained following ultrasound guided biopsy of a palpable mass in the right breast 1 o'clock  position and a prominent right intramammary lymph node. The ribbon shaped biopsy marking clip is in expected position at the site of biopsy in the right breast 1 o'clock position 12 cm from nipple. The coil shaped biopsy marking clip is in expected position at the site of biopsy in the right breast 10 o'clock position 10 cm from nipple. IMPRESSION: Appropriate positioning of the ribbon and coil shaped biopsy marking clips at the sites of biopsy in the RIGHT breast. Final Assessment: Post Procedure Mammograms for Marker Placement Electronically Signed   By: Dirk Arrant M.D.   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 Ki-67 50%- ONE LYMPH NODE, POSITIVE FOR METASTATIC CARCINOMA (1/1); METASTATIC FOCUS: 4 MM; SUSPICIOUS FOR EXTRANODAL EXTENSION Dr.Cintron- ONCOTYPE- 28. Discussed the role of endocrine therapy-given ER/PR positive disease postsurgery; antihormone pill 1 a day for 10 years;  adjuvant Zometa. Also consider CDK- inhibitors/ PARP inhibitors post surgery. Given multifocality-large size of the tumor; given BRCA 1 positive-agree with mastectomy with contralateral prophylactic mastectomy.  # proceed with neo-adjuvant chemo-Recommend  Adriamycin-Cytoxan every 3  weeks x 4 cycles followed by weekly Taxol x 12 cycles.  Given BRCA positive-recommend adding carboplatin AUC 5 every 3 weeks.  Recommend growth factor support with Adriamycin and Cytoxan. Start with taxol weekly first. Goal of treatment would be cure.   #  I reviewed at length the individual components with chemotherapy; and the schedule in detail.  I also discussed the potential side effects including but not limited to-increasing fatigue, nausea vomiting, diarrhea, hair loss,  sores in the mouth, increase risk of infection and also neuropathy.  Also discussed regarding potential side effects of heart failure/leukemia with Adriamycin. Would check a 2 D Echo/MUGA scan.  Discussed with Dr. Cesar.    .   # GERD: stable.   # HTN:  stable.    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  # Remote history of lung cancer stage I-no concerns for any clinical recurrence.  # BRCA-positive- prophylactic contralateral mastectomy.  Consider bilateral prophylactic oophorectomy down the line.  #  Chemotherapy education/nutrition evaluation: port placement. Plan start chemotherapy Sent the scripts for antiemetics-Zofran  and Compazine; EMLA cream sent to pharmacy  # Social- FMLA/disability-    Weekly MD;labs;chemo  PS-  # DISPOSITION: # refer to Chemo education ASAP- re: chemo- carbo-taxol- AC # Cbc/cmp weekly # MUGA scan ASAP # as per IS- Dr.B  # 40 minutes face-to-face with the patient discussing the above plan of care; more than 50% of time spent on prognosis/ natural history; counseling and coordination.   Above plan of care was discussed with patient/family in detail.  My contact information was given to the patient/family.      Cindy JONELLE Joe, MD 09/29/2023 10:53 AM

## 2023-09-29 NOTE — Progress Notes (Signed)
 Patient reports she's feeling ok, but having some soreness from breast biopsy that she says is a level 2 on a pain scale from 1-10. Other than that, she has no new or acute concerns at this time.

## 2023-09-29 NOTE — Assessment & Plan Note (Addendum)
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 Ki-67 50%- ONE LYMPH NODE, POSITIVE FOR METASTATIC CARCINOMA (1/1); METASTATIC FOCUS: 4 MM; SUSPICIOUS FOR EXTRANODAL EXTENSION Dr.Cintron- ONCOTYPE- 28. Discussed the role of endocrine therapy-given ER/PR positive disease postsurgery; antihormone pill 1 a day for 10 years;  adjuvant Zometa. Also consider CDK- inhibitors/ PARP inhibitors post surgery. Given multifocality-large size of the tumor; given BRCA 1 positive-agree with mastectomy with contralateral prophylactic mastectomy.  # proceed with neo-adjuvant chemo-Recommend  Adriamycin-Cytoxan every 3  weeks x 4 cycles followed by weekly Taxol x 12 cycles.  Given BRCA positive-recommend adding carboplatin AUC 5 every 3 weeks.  Recommend growth factor support with Adriamycin and Cytoxan. Start with taxol weekly first. Goal of treatment would be cure.   #  I reviewed at length the individual components with chemotherapy; and the schedule in detail.  I also discussed the potential side effects including but not limited to-increasing fatigue, nausea vomiting, diarrhea, hair loss, sores in the mouth, increase risk of infection and also neuropathy.  Also discussed regarding potential side effects of heart failure/leukemia with Adriamycin. Would check a 2 D Echo/MUGA scan.  Discussed with Dr. Cesar.    .   # GERD: stable.   # HTN:  stable.    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  # Remote history of lung cancer stage I-no concerns for any clinical recurrence.  # BRCA-positive- prophylactic contralateral mastectomy.  Consider bilateral prophylactic oophorectomy down the line.  #  Chemotherapy education/nutrition evaluation: port placement. Plan start chemotherapy Sent the scripts for antiemetics-Zofran  and Compazine; EMLA cream sent to pharmacy  #  Social- FMLA/disability-    Weekly MD;labs;chemo  PS-  # DISPOSITION: # refer to Chemo education ASAP- re: chemo- carbo-taxol- AC # Cbc/cmp weekly # MUGA scan ASAP # as per IS- Dr.B  # 40 minutes face-to-face with the patient discussing the above plan of care; more than 50% of time spent on prognosis/ natural history; counseling and coordination.

## 2023-09-29 NOTE — Telephone Encounter (Signed)
 FMLA received, completed, and faxed to employer.  Copy downstairs for patient to pick up.  Patient notified.

## 2023-09-29 NOTE — Addendum Note (Signed)
 Addended by: RENNIE CINDY SAUNDERS on: 09/29/2023 10:54 AM   Modules accepted: Orders

## 2023-09-29 NOTE — Progress Notes (Signed)
 START ON PATHWAY REGIMEN - Breast     Cycles 1 through 12: A cycle is every 7 days:     Paclitaxel    Cycles 13 through 16: A cycle is every 21 days:     Doxorubicin      Cyclophosphamide   **Always confirm dose/schedule in your pharmacy ordering system**  Patient Characteristics: Preoperative or Nonsurgical Candidate, M0 (Clinical Staging), Up to cT4c, Any N, M0, Neoadjuvant Therapy followed by Surgery, Invasive Disease, Chemotherapy, HER2 Negative, ER Positive Therapeutic Status: Preoperative or Nonsurgical Candidate, M0 (Clinical Staging) AJCC M Category: cM0 AJCC Grade: G2 ER Status: Positive (+) AJCC 8 Stage Grouping: IIB HER2 Status: Negative (-) AJCC T Category: cT2 AJCC N Category: cN1 PR Status: Negative (-) Breast Surgical Plan: Neoadjuvant Therapy followed by Surgery Intent of Therapy: Curative Intent, Discussed with Patient

## 2023-09-29 NOTE — Progress Notes (Signed)
 Pharmacist Chemotherapy Monitoring - Initial Assessment    Anticipated start date: 10/12/23   The following has been reviewed per standard work regarding the patient's treatment regimen: The patient's diagnosis, treatment plan and drug doses, and organ/hematologic function Lab orders and baseline tests specific to treatment regimen  The treatment plan start date, drug sequencing, and pre-medications Prior authorization status  Patient's documented medication list, including drug-drug interaction screen and prescriptions for anti-emetics and supportive care specific to the treatment regimen The drug concentrations, fluid compatibility, administration routes, and timing of the medications to be used The patient's access for treatment and lifetime cumulative dose history, if applicable  The patient's medication allergies and previous infusion related reactions, if applicable   # proceed with neo-adjuvant chemo-Recommend  Adriamycin-Cytoxan every 3  weeks x 4 cycles followed by weekly Taxol x 12 cycles.  Given BRCA positive-recommend adding carboplatin AUC 5 every 3 weeks.  Recommend growth factor support with Adriamycin and Cytoxan. Start with taxol weekly first. Goal of treatment would be cure.   Follow up needed:  Need baseline MUGA  Redell JINNY Gaskins, [facility], 09/29/2023  11:43 AM

## 2023-09-30 ENCOUNTER — Other Ambulatory Visit: Payer: Self-pay

## 2023-10-01 MED ORDER — CEFAZOLIN SODIUM-DEXTROSE 2-4 GM/100ML-% IV SOLN
2.0000 g | INTRAVENOUS | Status: AC
  Administered 2023-10-02: 2 g via INTRAVENOUS

## 2023-10-01 MED ORDER — LACTATED RINGERS IV SOLN: INTRAVENOUS | Status: DC

## 2023-10-01 MED ORDER — ORAL CARE MOUTH RINSE: 15.0000 mL | Freq: Once | OROMUCOSAL | Status: AC

## 2023-10-01 MED ORDER — CHLORHEXIDINE GLUCONATE 0.12 % MT SOLN
15.0000 mL | Freq: Once | OROMUCOSAL | Status: AC
  Administered 2023-10-02: 15 mL via OROMUCOSAL

## 2023-10-02 ENCOUNTER — Other Ambulatory Visit: Payer: Self-pay

## 2023-10-02 ENCOUNTER — Ambulatory Visit
Admission: RE | Admit: 2023-10-02 | Discharge: 2023-10-02 | Disposition: A | Discharge status: Home / Self Care | Attending: General Surgery | Admitting: General Surgery

## 2023-10-02 ENCOUNTER — Encounter
Admission: RE | Disposition: A | Discharge status: Home / Self Care | Payer: Self-pay | Source: Home / Self Care | Attending: General Surgery

## 2023-10-02 ENCOUNTER — Ambulatory Visit

## 2023-10-02 ENCOUNTER — Ambulatory Visit: Payer: Self-pay | Admitting: Urgent Care

## 2023-10-02 ENCOUNTER — Encounter: Payer: Self-pay | Admitting: General Surgery

## 2023-10-02 DIAGNOSIS — C50211 Malignant neoplasm of upper-inner quadrant of right female breast: Secondary | ICD-10-CM | POA: Insufficient documentation

## 2023-10-02 DIAGNOSIS — Z85118 Personal history of other malignant neoplasm of bronchus and lung: Secondary | ICD-10-CM | POA: Insufficient documentation

## 2023-10-02 DIAGNOSIS — I10 Essential (primary) hypertension: Secondary | ICD-10-CM | POA: Insufficient documentation

## 2023-10-02 DIAGNOSIS — Z1501 Genetic susceptibility to malignant neoplasm of breast: Secondary | ICD-10-CM | POA: Insufficient documentation

## 2023-10-02 DIAGNOSIS — Z923 Personal history of irradiation: Secondary | ICD-10-CM | POA: Insufficient documentation

## 2023-10-02 DIAGNOSIS — Z8249 Family history of ischemic heart disease and other diseases of the circulatory system: Secondary | ICD-10-CM | POA: Insufficient documentation

## 2023-10-02 DIAGNOSIS — Z902 Acquired absence of lung [part of]: Secondary | ICD-10-CM | POA: Insufficient documentation

## 2023-10-02 DIAGNOSIS — Z803 Family history of malignant neoplasm of breast: Secondary | ICD-10-CM | POA: Insufficient documentation

## 2023-10-02 DIAGNOSIS — J449 Chronic obstructive pulmonary disease, unspecified: Secondary | ICD-10-CM | POA: Insufficient documentation

## 2023-10-02 DIAGNOSIS — Z1509 Genetic susceptibility to other malignant neoplasm: Secondary | ICD-10-CM | POA: Insufficient documentation

## 2023-10-02 DIAGNOSIS — Z801 Family history of malignant neoplasm of trachea, bronchus and lung: Secondary | ICD-10-CM | POA: Insufficient documentation

## 2023-10-02 DIAGNOSIS — K219 Gastro-esophageal reflux disease without esophagitis: Secondary | ICD-10-CM | POA: Insufficient documentation

## 2023-10-02 DIAGNOSIS — Z79899 Other long term (current) drug therapy: Secondary | ICD-10-CM | POA: Insufficient documentation

## 2023-10-02 DIAGNOSIS — Z87891 Personal history of nicotine dependence: Secondary | ICD-10-CM | POA: Insufficient documentation

## 2023-10-02 HISTORY — PX: PORTACATH PLACEMENT: SHX2246

## 2023-10-02 SURGERY — INSERTION, TUNNELED CENTRAL VENOUS DEVICE, WITH PORT
Anesthesia: General | Site: Chest

## 2023-10-02 MED ORDER — PROPOFOL 10 MG/ML IV BOLUS
INTRAVENOUS | Status: DC | PRN
  Administered 2023-10-02: 150 ug/kg/min via INTRAVENOUS
  Administered 2023-10-02: 150 mg via INTRAVENOUS

## 2023-10-02 MED ORDER — PHENYLEPHRINE 80 MCG/ML (10ML) SYRINGE FOR IV PUSH (FOR BLOOD PRESSURE SUPPORT)
PREFILLED_SYRINGE | INTRAVENOUS | Status: AC
  Filled 2023-10-02: qty 10

## 2023-10-02 MED ORDER — OXYCODONE HCL 5 MG PO TABS
5.0000 mg | ORAL_TABLET | Freq: Once | ORAL | Status: AC | PRN
  Administered 2023-10-02: 5 mg via ORAL

## 2023-10-02 MED ORDER — OXYCODONE HCL 5 MG/5ML PO SOLN: 5.0000 mg | Freq: Once | ORAL | Status: AC | PRN

## 2023-10-02 MED ORDER — FENTANYL CITRATE (PF) 100 MCG/2ML IJ SOLN
25.0000 ug | INTRAMUSCULAR | Status: DC | PRN
  Administered 2023-10-02: 50 ug via INTRAVENOUS

## 2023-10-02 MED ORDER — FENTANYL CITRATE (PF) 100 MCG/2ML IJ SOLN
INTRAMUSCULAR | Status: AC
  Filled 2023-10-02: qty 2

## 2023-10-02 MED ORDER — HEPARIN SODIUM (PORCINE) 5000 UNIT/ML IJ SOLN
INTRAMUSCULAR | Status: AC
  Filled 2023-10-02: qty 2

## 2023-10-02 MED ORDER — CEFAZOLIN SODIUM-DEXTROSE 2-4 GM/100ML-% IV SOLN
INTRAVENOUS | Status: AC
Start: 2023-10-02 — End: 2023-10-02
  Filled 2023-10-02: qty 100

## 2023-10-02 MED ORDER — BUPIVACAINE-EPINEPHRINE (PF) 0.25% -1:200000 IJ SOLN
INTRAMUSCULAR | Status: DC | PRN
  Administered 2023-10-02: 10 mL

## 2023-10-02 MED ORDER — BUPIVACAINE-EPINEPHRINE (PF) 0.25% -1:200000 IJ SOLN
INTRAMUSCULAR | Status: AC
  Filled 2023-10-02: qty 30

## 2023-10-02 MED ORDER — OXYCODONE HCL 5 MG PO TABS
ORAL_TABLET | ORAL | Status: AC
  Filled 2023-10-02: qty 1

## 2023-10-02 MED ORDER — SODIUM CHLORIDE 0.9 % IV SOLN
INTRAVENOUS | Status: DC | PRN
  Administered 2023-10-02: 10 mL via INTRAMUSCULAR

## 2023-10-02 MED ORDER — PROPOFOL 1000 MG/100ML IV EMUL
INTRAVENOUS | Status: AC
  Filled 2023-10-02: qty 100

## 2023-10-02 MED ORDER — LIDOCAINE HCL (PF) 2 % IJ SOLN
INTRAMUSCULAR | Status: AC
  Filled 2023-10-02: qty 5

## 2023-10-02 MED ORDER — CHLORHEXIDINE GLUCONATE 0.12 % MT SOLN
OROMUCOSAL | Status: AC
  Filled 2023-10-02: qty 15

## 2023-10-02 MED ORDER — TRAMADOL HCL 50 MG PO TABS: 50.0000 mg | ORAL_TABLET | Freq: Four times a day (QID) | ORAL | 0 refills | Status: AC | PRN

## 2023-10-02 MED ORDER — MIDAZOLAM HCL 2 MG/2ML IJ SOLN
INTRAMUSCULAR | Status: DC | PRN
  Administered 2023-10-02: 2 mg via INTRAVENOUS

## 2023-10-02 MED ORDER — LIDOCAINE HCL (CARDIAC) PF 100 MG/5ML IV SOSY
PREFILLED_SYRINGE | INTRAVENOUS | Status: DC | PRN
  Administered 2023-10-02: 100 mg via INTRAVENOUS

## 2023-10-02 MED ORDER — MIDAZOLAM HCL 2 MG/2ML IJ SOLN
INTRAMUSCULAR | Status: AC
Start: 2023-10-02 — End: 2023-10-02
  Filled 2023-10-02: qty 2

## 2023-10-02 MED ORDER — PHENYLEPHRINE 80 MCG/ML (10ML) SYRINGE FOR IV PUSH (FOR BLOOD PRESSURE SUPPORT)
PREFILLED_SYRINGE | INTRAVENOUS | Status: DC | PRN
  Administered 2023-10-02 (×3): 160 ug via INTRAVENOUS

## 2023-10-02 MED ORDER — FENTANYL CITRATE (PF) 100 MCG/2ML IJ SOLN
INTRAMUSCULAR | Status: DC | PRN
  Administered 2023-10-02: 50 ug via INTRAVENOUS

## 2023-10-02 SURGICAL SUPPLY — 27 items
BAG DECANTER FOR FLEXI CONT (MISCELLANEOUS) ×1 IMPLANT
BLADE SURG 15 STRL LF DISP TIS (BLADE) ×1 IMPLANT
BLADE SURG SZ11 CARB STEEL (BLADE) ×1 IMPLANT
CLAMP SUTURE YELLOW 5 PAIRS (MISCELLANEOUS) ×1 IMPLANT
DERMABOND ADVANCED .7 DNX12 (GAUZE/BANDAGES/DRESSINGS) ×1 IMPLANT
DRAPE C-ARM XRAY 36X54 (DRAPES) ×1 IMPLANT
ELECTRODE REM PT RTRN 9FT ADLT (ELECTROSURGICAL) ×1 IMPLANT
GLOVE BIO SURGEON STRL SZ 6.5 (GLOVE) ×1 IMPLANT
GLOVE BIOGEL PI IND STRL 6.5 (GLOVE) ×1 IMPLANT
GLOVE SURG SYN 6.5 PF PI (GLOVE) ×2 IMPLANT
GOWN STRL REUS W/ TWL LRG LVL3 (GOWN DISPOSABLE) ×3 IMPLANT
IV NS 500ML BAXH (IV SOLUTION) ×1 IMPLANT
KIT PORT INFUSION SMART 8FR (Port) ×1 IMPLANT
KIT TURNOVER KIT A (KITS) ×1 IMPLANT
LABEL OR SOLS (LABEL) ×1 IMPLANT
MANIFOLD NEPTUNE II (INSTRUMENTS) ×1 IMPLANT
NDL FILTER BLUNT 18X1 1/2 (NEEDLE) ×1 IMPLANT
NEEDLE FILTER BLUNT 18X1 1/2 (NEEDLE) ×1 IMPLANT
PACK PORT-A-CATH (MISCELLANEOUS) ×1 IMPLANT
SUT MNCRL AB 4-0 PS2 18 (SUTURE) ×1 IMPLANT
SUT PROLENE 2 0 FS (SUTURE) ×1 IMPLANT
SUT VIC AB 2-0 SH 27XBRD (SUTURE) ×1 IMPLANT
SUT VIC AB 3-0 SH 27X BRD (SUTURE) ×1 IMPLANT
SYR 10ML LL (SYRINGE) ×2 IMPLANT
SYR 3ML LL SCALE MARK (SYRINGE) ×1 IMPLANT
TRAP FLUID SMOKE EVACUATOR (MISCELLANEOUS) ×1 IMPLANT
WATER STERILE IRR 500ML POUR (IV SOLUTION) ×1 IMPLANT

## 2023-10-02 NOTE — Interval H&P Note (Signed)
 History and Physical Interval Note:  10/02/2023 7:00 AM  Connie Osborne  has presented today for surgery, with the diagnosis of C50.211 Malignant neoplasm of upper-inner quadrant of rt female breast, unspecified estrogen receptor Z15.01 Z15.09 BRCA1 gene mutation positive.  The various methods of treatment have been discussed with the patient and family. After consideration of risks, benefits and other options for treatment, the patient has consented to  Procedure(s): INSERTION, TUNNELED CENTRAL VENOUS DEVICE, WITH PORT (N/A) as a surgical intervention.  The patient's history has been reviewed, patient examined, no change in status, stable for surgery.  I have reviewed the patient's chart and labs.  Questions were answered to the patient's satisfaction.     Lucas Sjogren

## 2023-10-02 NOTE — Anesthesia Preprocedure Evaluation (Addendum)
 Anesthesia Evaluation  Patient identified by MRN, date of birth, ID band Patient awake    Reviewed: Allergy & Precautions, NPO status , Patient's Chart, lab work & pertinent test results  History of Anesthesia Complications Negative for: history of anesthetic complications  Airway Mallampati: II  TM Distance: >3 FB Neck ROM: full    Dental  (+) Missing   Pulmonary neg pulmonary ROS, COPD, former smoker   Pulmonary exam normal        Cardiovascular hypertension, Pt. on medications and Pt. on home beta blockers negative cardio ROS Normal cardiovascular exam     Neuro/Psych neg Seizures PSYCHIATRIC DISORDERS Anxiety     negative neurological ROS     GI/Hepatic negative GI ROS, Neg liver ROS,GERD  Poorly Controlled,,  Endo/Other  negative endocrine ROSneg diabetes    Renal/GU      Musculoskeletal   Abdominal   Peds  Hematology negative hematology ROS (+)   Anesthesia Other Findings Past Medical History: No date: Allergy No date: Anxiety No date: Arthritis 2014: Breast cancer (HCC)     Comment:  Left- Radiation; BRCA 1 +  No date: COPD (chronic obstructive pulmonary disease) (HCC) No date: Coughing up blood No date: GERD (gastroesophageal reflux disease) No date: Hypertension No date: Lung cancer (HCC)     Comment:  Carcinoma, right upper lobe  No date: Personal history of radiation therapy No date: Pre-diabetes No date: Shortness of breath dyspnea No date: Vitamin D deficiency  Past Surgical History: No date: ABDOMINAL HYSTERECTOMY 2014: BREAST BIOPSY; Left     Comment:  + 06/11/2019: BREAST BIOPSY; Left     Comment:  stereo bx, x-clip, negative 08/30/2023: BREAST BIOPSY; Right     Comment:  US  RT BREAST BX W LOC DEV 1ST LESION IMG BX SPEC US                GUIDE 08/30/2023 [facility] 08/30/2023: BREAST BIOPSY; Right     Comment:  US  RT BREAST BX W LOC DEV EA ADD LESION IMG BX SPEC US                 GUIDE 08/30/2023 [facility] 2014: BREAST EXCISIONAL BIOPSY; Left 2014: BREAST LUMPECTOMY; Left 2014: BREAST SURGERY; Left     Comment:  lumpectomy 08/18/2015: COLONOSCOPY WITH PROPOFOL ; N/A     Comment:  Procedure: COLONOSCOPY WITH PROPOFOL ;  Surgeon:               Louanne KANDICE Muse, MD;  Location: [facility];                Service: Endoscopy;  Laterality: N/A; 10/12/2016: COLONOSCOPY WITH PROPOFOL ; N/A     Comment:  Procedure: COLONOSCOPY WITH PROPOFOL ;  Surgeon: Muse Louanne KANDICE, MD;  Location: [facility];  Service:               Endoscopy;  Laterality: N/A; 07/28/2015: ELECTROMAGNETIC NAVIGATION BROCHOSCOPY; Right     Comment:  Procedure: ELECTROMAGNETIC NAVIGATION BRONCHOSCOPY;                Surgeon: Nickolas Cellar, MD;  Location: [facility];  Service:               Cardiopulmonary;  Laterality: Right; No date: FOOT SURGERY No date: FRACTURE SURGERY No date: GANGLION CYST EXCISION 09/14/2015: THORACOTOMY/LOBECTOMY; Right     Comment:  Procedure: THORACOTOMY/LOBECTOMY;  Surgeon: Louanne  KANDICE Muse, MD;  Location: [facility];  Service: Thoracic;                Laterality: Right;  BMI    Body Mass Index: 29.54 kg/m      Reproductive/Obstetrics negative OB ROS                              Anesthesia Physical Anesthesia Plan  ASA: 3  Anesthesia Plan: General   Post-op Pain Management:    Induction: Intravenous  PONV Risk Score and Plan: 3 and Ondansetron , Dexamethasone  and Midazolam   Airway Management Planned: LMA  Additional Equipment:   Intra-op Plan:   Post-operative Plan: Extubation in OR  Informed Consent: I have reviewed the patients History and Physical, chart, labs and discussed the procedure including the risks, benefits and alternatives for the proposed anesthesia with the patient or authorized representative who has indicated his/her understanding and acceptance.     Dental  Advisory Given  Plan Discussed with: Anesthesiologist, CRNA and Surgeon  Anesthesia Plan Comments: (Patient consented for risks of anesthesia including but not limited to:  - adverse reactions to medications - damage to eyes, teeth, lips or other oral mucosa - nerve damage due to positioning  - sore throat or hoarseness - Damage to heart, brain, nerves, lungs, other parts of body or loss of life  Patient voiced understanding and assent.)         Anesthesia Quick Evaluation

## 2023-10-02 NOTE — Transfer of Care (Signed)
 Immediate Anesthesia Transfer of Care Note  Patient: Connie Osborne  Procedure(s) Performed: INSERTION, TUNNELED CENTRAL VENOUS DEVICE, WITH PORT (Chest)  Patient Location: PACU  Anesthesia Type:General  Level of Consciousness: drowsy  Airway & Oxygen Therapy: Patient Spontanous Breathing  Post-op Assessment: Report given to RN and Post -op Vital signs reviewed and stable  Post vital signs: Reviewed and stable  Last Vitals:  Vitals Value Taken Time  BP 145/125 10/02/23 08:34  Temp    Pulse 89 10/02/23 08:36  Resp 15 10/02/23 08:36  SpO2 99 % 10/02/23 08:36  Vitals shown include unfiled device data.  Last Pain:  Vitals:   10/02/23 0607  TempSrc: Temporal  PainSc: 0-No pain         Complications: There were no known notable events for this encounter.

## 2023-10-02 NOTE — Anesthesia Postprocedure Evaluation (Signed)
 Anesthesia Post Note  Patient: Connie Osborne  Procedure(s) Performed: INSERTION, TUNNELED CENTRAL VENOUS DEVICE, WITH PORT (Chest)  Patient location during evaluation: PACU Anesthesia Type: General Level of consciousness: awake and alert Pain management: pain level controlled Vital Signs Assessment: post-procedure vital signs reviewed and stable Respiratory status: spontaneous breathing, nonlabored ventilation, respiratory function stable and patient connected to nasal cannula oxygen Cardiovascular status: blood pressure returned to baseline and stable Postop Assessment: no apparent nausea or vomiting Anesthetic complications: no   There were no known notable events for this encounter.   Last Vitals:  Vitals:   10/02/23 0918 10/02/23 0933  BP:  (!) 158/79  Pulse: 71 67  Resp: 19 14  Temp:  (!) 36.2 C  SpO2: 100% 100%    Last Pain:  Vitals:   10/02/23 0933  TempSrc: Temporal  PainSc: 4                  Debby Mines

## 2023-10-02 NOTE — Anesthesia Procedure Notes (Addendum)
 Procedure Name: LMA Insertion Date/Time: 10/02/2023 7:41 AM  Performed by: Dior Dominik R, CRNAPre-anesthesia Checklist: Patient identified, Emergency Drugs available, Suction available, Patient being monitored and Timeout performed Patient Re-evaluated:Patient Re-evaluated prior to induction Oxygen Delivery Method: Circle system utilized Preoxygenation: Pre-oxygenation with 100% oxygen Induction Type: IV induction LMA: LMA inserted LMA Size: 3.0 Number of attempts: 1 Placement Confirmation: positive ETCO2 and breath sounds checked- equal and bilateral Tube secured with: Tape Dental Injury: Teeth and Oropharynx as per pre-operative assessment

## 2023-10-02 NOTE — Op Note (Signed)
 SURGICAL PROCEDURE REPORT  DATE OF PROCEDURE: 10/02/2023   SURGEON: Dr. Cesar Coe   ANESTHESIA: Local with light IV sedation   PRE-OPERATIVE DIAGNOSIS: Advanced breast cancer requiring durable central venous access for chemotherapy   POST-OPERATIVE DIAGNOSIS: Same  PROCEDURE(S): (cpt: 36561) 1.) Percutaneous access of Left internal jugular vein under ultrasound guidance 2.) Insertion of tunneled Left internal jugular central venous catheter with subcutaneous port  INTRAOPERATIVE FINDINGS: Patent easily compressible Left internal jugular vein with appropriate respiratory variations and well-secured tunneled central venous catheter with subcutaneous port at completion of the procedure  ESTIMATED BLOOD LOSS: Minimal (<20 mL)   SPECIMENS: None   IMPLANTS: 54F tunneled Bard PowerPort central venous catheter with subcutaneous port  DRAINS: None   COMPLICATIONS: None apparent   CONDITION AT COMPLETION: Hemodynamically stable, awake   DISPOSITION: PACU   INDICATION(S) FOR PROCEDURE:  Patient is a 63 y.o. female who presented with advanced breast cancer requiring durable central venous access for chemotherapy. All risks, benefits, and alternatives to above elective procedures were discussed with the patient, who elected to proceed, and informed consent was accordingly obtained at that time.  DETAILS OF PROCEDURE:  Patient was brought to the operative suite and appropriately identified. In Trendelenburg position, Left IJ venous access site was prepped and draped in the usual sterile fashion, and following a brief timeout, percutaneous Left IJ venous access was obtained under ultrasound guidance using Seldinger technique, by which local anesthetic was injected over the Left IJ vein, and access needle was inserted under direct ultrasound visualization into the Left IJ vein, through which soft guidewire was advanced, over which access needle was withdrawn. Guidewire was secured, attention  was directed to injection of local anesthetic along the planned tunnel site, 2-3 cm transverse Left chest incision was made and confirmed to accommodate the subcutaneous port, and flushed catheter was tunneled retrograde from the port site over the Left chest to the Left IJ access site with the attached port well-secured to the catheter and within the subcutaneous pocket. Insertion sheath was advanced over the guidewire, which was withdrawn along with the insertion sheath dilator. The catheter was introduced through the sheath and left on the [place] junction under fluoro guidance and catheter cut to desire lenght. Catheter connected to port and fixed to the pocket on two side to avoid twisting. Port was confirmed to withdraw blood and flush easily, after which concentrated heparin  was instilled into the port and catheter. Dermis at the subcutaneous pocket was re-approximated using buried interrupted 3-0 Vicryl suture, and 4-0 Monocryl suture was used to re-approximate skin at the insertion and subcutaneous port sites in running subcuticular fashion for the subcutaneous port and buried interrupted fashion for the insertion site. Skin was cleaned, dried, and sterile skin glue was applied. Patient was then safely transferred to PACU for a chest x-ray. Ultrasound images are available on paper chart and Fluoroscopy guidance images are available in Epic.

## 2023-10-02 NOTE — Discharge Instructions (Addendum)
  Diet: Resume home heart healthy regular diet.   Activity: Increase activity as tolerated. Light activity and walking are encouraged. Do not drive or drink alcohol if taking narcotic pain medications.  Wound care: May shower with soapy water and pat dry (do not rub incisions), but no baths or submerging incision underwater until follow-up. (no swimming)   Medications: Resume all home medications. For mild to moderate pain: acetaminophen (Tylenol) or ibuprofen (if no kidney disease). Combining Tylenol with alcohol can substantially increase your risk of causing liver disease. Narcotic pain medications, if prescribed, can be used for severe pain, though may cause nausea, constipation, and drowsiness. If you do not need the narcotic pain medication, you do not need to fill the prescription.  Call office [phone]) at any time if any questions, worsening pain, fevers/chills, bleeding, drainage from incision site, or other concerns.

## 2023-10-03 ENCOUNTER — Ambulatory Visit: Admitting: Internal Medicine

## 2023-10-03 ENCOUNTER — Inpatient Hospital Stay

## 2023-10-03 ENCOUNTER — Encounter: Payer: Self-pay | Admitting: General Surgery

## 2023-10-03 DIAGNOSIS — C50411 Malignant neoplasm of upper-outer quadrant of right female breast: Secondary | ICD-10-CM

## 2023-10-04 ENCOUNTER — Inpatient Hospital Stay

## 2023-10-04 ENCOUNTER — Inpatient Hospital Stay: Admitting: Licensed Clinical Social Worker

## 2023-10-04 NOTE — Progress Notes (Signed)
 [facility] Psychosocial Distress Screening Clinical Social Work  Connie Osborne is a 63 y.o. year old female. Clinical Social Work was referred by nurse navigator for positive distress screening. The patient scored a 6 on the Psychosocial Distress Thermometer which indicates moderate distress. Clinical Social Worker contacted patient by phone to assess for distress and other psychosocial needs.     Distress Screen:    10/03/2023    3:45 PM  ONCBCN DISTRESS SCREENING  Screening Type Initial Screening  How much distress have you been experiencing in the past week? (0-10) 6  Practical concerns type Work;Finances;Having enough food  Emotional concerns type Worry or anxiety  Physical Concerns Type  Sleep;Fatigue     Interventions: Referred patient to community resources: Programme researcher, broadcasting/film/video and [employer] card.  Patient currently lives alone and has three sisters that are supportive.  She also belongs to a church that is supportive.  She is currently employed at [employer] and had disability through her employer.  She is concerned about her financial situation.  CSW provided the phone number for [facility] Patient Customer Service.  Patient does not currently qualify for the [facility].   CSW and patient discussed common feeling and emotions when being diagnosed with cancer, and the importance of support during treatment.  CSW informed patient of the support team and support services at [facility].  CSW provided contact information and encouraged patient to call with any questions or concerns.   Follow Up Plan: CSW will follow-up with patient by phone  Patient verbalizes understanding of plan: Yes    [name], LCSW

## 2023-10-05 ENCOUNTER — Encounter: Payer: Self-pay | Admitting: Internal Medicine

## 2023-10-07 ENCOUNTER — Other Ambulatory Visit: Payer: Self-pay

## 2023-10-11 ENCOUNTER — Other Ambulatory Visit

## 2023-10-11 ENCOUNTER — Ambulatory Visit: Admitting: Internal Medicine

## 2023-10-11 ENCOUNTER — Ambulatory Visit

## 2023-10-11 MED FILL — Fosaprepitant Dimeglumine For IV Infusion 150 MG (Base Eq): INTRAVENOUS | Qty: 5 | Status: AC

## 2023-10-12 ENCOUNTER — Inpatient Hospital Stay

## 2023-10-12 ENCOUNTER — Encounter: Payer: Self-pay | Admitting: Internal Medicine

## 2023-10-12 ENCOUNTER — Encounter: Payer: Self-pay | Admitting: *Deleted

## 2023-10-12 ENCOUNTER — Ambulatory Visit

## 2023-10-12 ENCOUNTER — Inpatient Hospital Stay: Admitting: Internal Medicine

## 2023-10-12 VITALS — BP 151/88 | HR 73 | Temp 98.3°F | Resp 18 | Ht 65.0 in | Wt 177.2 lb

## 2023-10-12 VITALS — BP 123/78 | HR 78 | Temp 97.9°F | Resp 18

## 2023-10-12 DIAGNOSIS — Z5111 Encounter for antineoplastic chemotherapy: Secondary | ICD-10-CM | POA: Diagnosis not present

## 2023-10-12 DIAGNOSIS — Z170 Estrogen receptor positive status [ER+]: Secondary | ICD-10-CM

## 2023-10-12 DIAGNOSIS — C50411 Malignant neoplasm of upper-outer quadrant of right female breast: Secondary | ICD-10-CM | POA: Diagnosis not present

## 2023-10-12 DIAGNOSIS — C50911 Malignant neoplasm of unspecified site of right female breast: Secondary | ICD-10-CM

## 2023-10-12 LAB — CMP (CANCER CENTER ONLY)
ALT: 20 U/L (ref 0–44)
AST: 24 U/L (ref 15–41)
Albumin: 3.7 g/dL (ref 3.5–5.0)
Alkaline Phosphatase: 91 U/L (ref 38–126)
Anion gap: 8 (ref 5–15)
BUN: 20 mg/dL (ref 8–23)
CO2: 24 mmol/L (ref 22–32)
Calcium: 9.2 mg/dL (ref 8.9–10.3)
Chloride: 107 mmol/L (ref 98–111)
Creatinine: 0.64 mg/dL (ref 0.44–1.00)
GFR, Estimated: >60 mL/min (ref >60)
Glucose, Bld: 182 mg/dL — ABNORMAL HIGH (ref 70–99)
Potassium: 3.5 mmol/L (ref 3.5–5.1)
Sodium: 139 mmol/L (ref 135–145)
Total Bilirubin: 0.4 mg/dL (ref 0.0–1.2)
Total Protein: 7.3 g/dL (ref 6.5–8.1)

## 2023-10-12 LAB — CBC WITH DIFFERENTIAL (CANCER CENTER ONLY)
Abs Immature Granulocytes: 0.02 K/uL (ref 0.00–0.07)
Basophils Absolute: 0.1 K/uL (ref 0.0–0.1)
Basophils Relative: 1 %
Eosinophils Absolute: 0.3 K/uL (ref 0.0–0.5)
Eosinophils Relative: 3 %
HCT: 34.6 % — ABNORMAL LOW (ref 36.0–46.0)
Hemoglobin: 11.8 g/dL — ABNORMAL LOW (ref 12.0–15.0)
Immature Granulocytes: 0 %
Lymphocytes Relative: 32 %
Lymphs Abs: 2.6 K/uL (ref 0.7–4.0)
MCH: 31 pg (ref 26.0–34.0)
MCHC: 34.1 g/dL (ref 30.0–36.0)
MCV: 90.8 fL (ref 80.0–100.0)
Monocytes Absolute: 0.5 K/uL (ref 0.1–1.0)
Monocytes Relative: 7 %
Neutro Abs: 4.7 K/uL (ref 1.7–7.7)
Neutrophils Relative %: 57 %
Platelet Count: 318 K/uL (ref 150–400)
RBC: 3.81 MIL/uL — ABNORMAL LOW (ref 3.87–5.11)
RDW: 12.6 % (ref 11.5–15.5)
WBC Count: 8.2 K/uL (ref 4.0–10.5)
nRBC: 0 % (ref 0.0–0.2)

## 2023-10-12 MED ORDER — DIPHENHYDRAMINE HCL 50 MG/ML IJ SOLN
50.0000 mg | Freq: Once | INTRAMUSCULAR | Status: AC
  Administered 2023-10-12: 50 mg via INTRAVENOUS
  Filled 2023-10-12: qty 1

## 2023-10-12 MED ORDER — SODIUM CHLORIDE 0.9 % IV SOLN
150.0000 mg | Freq: Once | INTRAVENOUS | Status: AC
  Administered 2023-10-12: 150 mg via INTRAVENOUS
  Filled 2023-10-12: qty 150

## 2023-10-12 MED ORDER — FAMOTIDINE IN NACL 20-0.9 MG/50ML-% IV SOLN
20.0000 mg | Freq: Once | INTRAVENOUS | Status: AC
  Administered 2023-10-12: 20 mg via INTRAVENOUS
  Filled 2023-10-12: qty 50

## 2023-10-12 MED ORDER — DEXAMETHASONE SODIUM PHOSPHATE 10 MG/ML IJ SOLN
10.0000 mg | Freq: Once | INTRAMUSCULAR | Status: AC
  Administered 2023-10-12: 10 mg via INTRAVENOUS
  Filled 2023-10-12: qty 1

## 2023-10-12 MED ORDER — SODIUM CHLORIDE 0.9 % IV SOLN
INTRAVENOUS | Status: DC
Start: 2023-10-12 — End: 2023-10-12
  Filled 2023-10-12 (×2): qty 250

## 2023-10-12 MED ORDER — SODIUM CHLORIDE 0.9 % IV SOLN
80.0000 mg/m2 | Freq: Once | INTRAVENOUS | Status: AC
  Administered 2023-10-12: 156 mg via INTRAVENOUS
  Filled 2023-10-12: qty 26

## 2023-10-12 MED ORDER — SODIUM CHLORIDE 0.9 % IV SOLN
582.5000 mg | Freq: Once | INTRAVENOUS | Status: AC
  Administered 2023-10-12: 580 mg via INTRAVENOUS
  Filled 2023-10-12: qty 58

## 2023-10-12 MED ORDER — PALONOSETRON HCL INJECTION 0.25 MG/5ML
0.2500 mg | Freq: Once | INTRAVENOUS | Status: AC
  Administered 2023-10-12: 0.25 mg via INTRAVENOUS
  Filled 2023-10-12: qty 5

## 2023-10-12 NOTE — Progress Notes (Signed)
No questions or concerns today 

## 2023-10-12 NOTE — Progress Notes (Signed)
 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   # JULY-AUG 2025- RIGHT BREAST T2; intramammary lymph node positive- N-1- ER-PR POSITIVE: her 2 neg; Ki-67-50%; right breast cancer with T2N1- ER/PR + her 2 NEG breast cancer.   # AUG 14th, 2025-   # Colonoscopy [Dr.sankar-Neg June 2017]  # Smoker; BRCA-1 positive   Primary cancer of right upper lobe of lung (HCC)  10/26/2015 Initial Diagnosis   Primary cancer of right upper lobe of lung (HCC)   Carcinoma of upper-outer quadrant of right breast in female, estrogen receptor positive (HCC)  09/12/2023 Initial Diagnosis   Carcinoma of upper-outer quadrant of right breast in female, estrogen receptor positive (HCC)   09/12/2023 Cancer Staging   Staging form: Breast, AJCC 8th Edition - Clinical: Stage IIA (cT2, cN1, cM0, G2, ER+, PR+, HER2-) - Signed by Rennie Cindy SAUNDERS, MD on 09/29/2023 Histologic grading system: 3 grade system   10/12/2023 -  Chemotherapy   Patient is on Treatment Plan : BREAST Paclitaxel  q7d / AC q21d       HISTORY OF PRESENTING ILLNESS: Patient ambulating-independently. With family- sister.   Connie Osborne 63 y.o.  female pleasant patient with BRCA-1- left breast DCIS; and remote history of stage I lung cancer s/p surgery; and right breast cancer with T2N1- ER/PR + her 2 NEG breast cancer.   S/p port placement in interim.   Patient denies any unusual shortness of breath or bone pain.    Review of Systems  Constitutional:  Positive for malaise/fatigue. Negative for chills, diaphoresis, fever and weight loss.  HENT:  Negative for nosebleeds and sore throat.   Eyes:  Negative for double vision.  Respiratory:  Negative for cough, hemoptysis, sputum production, shortness of breath and wheezing.   Cardiovascular:  Negative for chest pain, palpitations, orthopnea and leg swelling.  Gastrointestinal:  Negative for abdominal pain, blood in stool, constipation, diarrhea, heartburn, melena, nausea and vomiting.  Genitourinary:  Negative for dysuria, frequency and urgency.  Musculoskeletal:  Positive for back pain and joint pain.  Skin: Negative.  Negative for itching and rash.  Neurological:  Negative for dizziness, tingling, focal weakness, weakness and headaches.  Endo/Heme/Allergies:  Does not bruise/bleed easily.  Psychiatric/Behavioral:  Negative for depression. The patient is not nervous/anxious and does not have insomnia.     MEDICAL HISTORY:  Past Medical History:  Diagnosis Date   Allergy    Anxiety    Arthritis    Breast cancer (HCC) 2014   Left- Radiation; BRCA 1 +    COPD (chronic obstructive pulmonary disease) (HCC)    Coughing up blood    GERD (gastroesophageal reflux disease)    Hypertension    Lung cancer (HCC)    Carcinoma, right upper lobe    Personal history of radiation therapy    Pre-diabetes    Shortness of breath dyspnea    Vitamin D deficiency     SURGICAL HISTORY: Past Surgical History:  Procedure Laterality Date  ABDOMINAL HYSTERECTOMY     BREAST BIOPSY Left 2014   +   BREAST BIOPSY Left 06/11/2019   stereo bx, x-clip, negative   BREAST BIOPSY Right 08/30/2023   US  RT BREAST BX W LOC DEV 1ST LESION IMG BX SPEC US  GUIDE 08/30/2023 [facility]   BREAST BIOPSY Right 08/30/2023   US  RT BREAST BX W LOC DEV EA ADD LESION IMG BX SPEC US  GUIDE 08/30/2023 [facility]   BREAST  EXCISIONAL BIOPSY Left 2014   BREAST LUMPECTOMY Left 2014   BREAST SURGERY Left 2014   lumpectomy   COLONOSCOPY WITH PROPOFOL  N/A 08/18/2015   Procedure: COLONOSCOPY WITH PROPOFOL ;  Surgeon: Louanne KANDICE Muse, MD;  Location: [facility];  Service: Endoscopy;  Laterality: N/A;   COLONOSCOPY WITH PROPOFOL  N/A 10/12/2016   Procedure: COLONOSCOPY WITH PROPOFOL ;  Surgeon: Muse Louanne KANDICE, MD;  Location: [facility];  Service: Endoscopy;  Laterality: N/A;   ELECTROMAGNETIC NAVIGATION BROCHOSCOPY Right 07/28/2015   Procedure: ELECTROMAGNETIC NAVIGATION BRONCHOSCOPY;  Surgeon: Nickolas Cellar, MD;  Location: [facility];  Service: Cardiopulmonary;  Laterality: Right;   FOOT SURGERY     FRACTURE SURGERY     GANGLION CYST EXCISION     PORTACATH PLACEMENT N/A 10/02/2023   Procedure: INSERTION, TUNNELED CENTRAL VENOUS DEVICE, WITH PORT;  Surgeon: Rodolph Romano, MD;  Location: [facility];  Service: General;  Laterality: N/A;   THORACOTOMY/LOBECTOMY Right 09/14/2015   Procedure: THORACOTOMY/LOBECTOMY;  Surgeon: Louanne KANDICE Muse, MD;  Location: [facility];  Service: Thoracic;  Laterality: Right;    SOCIAL HISTORY: Social History   Socioeconomic History   Marital status: Single    Spouse name: Not on file   Number of children: Not on file   Years of education: Not on file   Highest education level: Not on file  Occupational History   Not on file  Tobacco Use   Smoking status: Former    Current packs/day: 0.00    Average packs/day: 1 pack/day for 30.0 years (30.0 ttl pk-yrs)    Types: Cigarettes    Start date: 07/29/1985    Quit date: 07/30/2015    Years since quitting: 8.2   Smokeless tobacco: Never  Vaping Use   Vaping status: Never Used  Substance and Sexual Activity   Alcohol use: No    Alcohol/week: 0.0 standard drinks of alcohol   Drug use: No   Sexual activity: Not on file  Other Topics Concern   Not on file  Social History Narrative   Lives alone   Social  Drivers of Health   Financial Resource Strain: Medium Risk (09/07/2023)   Received [facility]   Overall Financial Resource Strain (CARDIA)    Difficulty of Paying Living Expenses: Somewhat hard  Food Insecurity: No Food Insecurity (09/12/2023)   Hunger Vital Sign    Worried About Running Out of Food in the Last Year: Never true    Ran Out of Food in the Last Year: Never true  Recent Concern: Food Insecurity - Food Insecurity Present (09/07/2023)   Received from [facility]   Hunger Vital Sign    Within the past 12 months, you worried that your food would run out before you got the money to buy more.: Sometimes true    Within the past 12 months, the food you bought just didn't last and you didn't have money to get more.: Never true  Transportation Needs: No Transportation Needs (09/12/2023)   PRAPARE - Administrator, Civil Service (Medical):  No    Lack of Transportation (Non-Medical): No  Physical Activity: Not on file  Stress: Not on file  Social Connections: Not on file  Intimate Partner Violence: Not At Risk (09/12/2023)   Humiliation, Afraid, Rape, and Kick questionnaire    Fear of Current or Ex-Partner: No    Emotionally Abused: No    Physically Abused: No    Sexually Abused: No    FAMILY HISTORY: Family History  Problem Relation Age of Onset   Hypertension Mother    Diabetes Mellitus II Mother    Cancer Mother        mets   Cancer Father        long cancer   Cancer Sister 35       breast   Breast cancer Sister 54   Cancer Sister 25       breast   Breast cancer Sister 11   Lung cancer Brother    Cancer Other        breast    ALLERGIES:  is allergic to accupril [quinapril hcl] and percocet [oxycodone -acetaminophen ].  MEDICATIONS:  Current Outpatient Medications  Medication Sig Dispense Refill   amLODipine  (NORVASC ) 10 MG tablet Take 10 mg by mouth daily with lunch.      Calcium Carb-Cholecalciferol (CALCIUM +  VITAMIN D3 PO) Take 1 tablet by mouth in the morning and at bedtime.     cetirizine (ZYRTEC) 10 MG tablet Take 10 mg by mouth as needed for allergies.     famotidine  (PEPCID ) 20 MG tablet Take 20 mg by mouth daily as needed for heartburn or indigestion.     lidocaine -prilocaine  (EMLA ) cream Apply on the port. 30 -45 min  prior to port access. 30 g 3   losartan  (COZAAR ) 100 MG tablet Take 100 mg by mouth daily with lunch.      metoprolol  succinate (TOPROL -XL) 25 MG 24 hr tablet Take 25 mg by mouth daily with lunch.      ondansetron  (ZOFRAN ) 8 MG tablet One pill every 8 hours as needed for nausea/vomitting. 40 tablet 1   Potassium Chloride  ER 20 MEQ TBCR Take 1 tablet by mouth daily.     prochlorperazine  (COMPAZINE ) 10 MG tablet Take 1 tablet (10 mg total) by mouth every 6 (six) hours as needed for nausea or vomiting. 30 tablet 0   traMADol  (ULTRAM ) 50 MG tablet Take 1 tablet (50 mg total) by mouth every 6 (six) hours as needed. 10 tablet 0   atorvastatin (LIPITOR) 40 MG tablet Take 40 mg by mouth daily. (Patient not taking: Reported on 10/12/2023)     No current facility-administered medications for this visit.   Facility-Administered Medications Ordered in Other Visits  Medication Dose Route Frequency Provider Last Rate Last Admin   0.9 %  sodium chloride  infusion   Intravenous Continuous Reann Dobias R, MD 10 mL/hr at 10/12/23 1049 New Bag at 10/12/23 1049   CARBOplatin  (PARAPLATIN ) 580 mg in sodium chloride  0.9 % 250 mL chemo infusion  580 mg Intravenous Once Gaberial Cada R, MD       dexamethasone  (DECADRON ) injection 10 mg  10 mg Intravenous Once Adriel Kessen R, MD       diphenhydrAMINE  (BENADRYL ) injection 50 mg  50 mg Intravenous Once Yussef Jorge R, MD       famotidine  (PEPCID ) IVPB 20 mg premix  20 mg Intravenous Once Naseem Varden R, MD       fosaprepitant  (EMEND) 150 mg in sodium chloride  0.9 % 145 mL  IVPB  150 mg Intravenous Once Crystalle Popwell R,  MD       PACLitaxel  (TAXOL ) 156 mg in sodium chloride  0.9 % 250 mL chemo infusion (</= 80mg /m2)  80 mg/m2 (Treatment Plan Recorded) Intravenous Once Hiilani Jetter R, MD       palonosetron  (ALOXI ) injection 0.25 mg  0.25 mg Intravenous Once Alauna Hayden R, MD        PHYSICAL EXAMINATION:   Vitals:   10/12/23 0934 10/12/23 0958  BP: (!) 160/86 (!) 151/88  Pulse: 73   Resp: 18   Temp: 98.3 F (36.8 C)   SpO2: 100%    Filed Weights   10/12/23 0934  Weight: 177 lb 3.2 oz (80.4 kg)   Right breast 12 o'clock position-approximately 3 to 4 cm mass-slightly mobile.  No skin changes no nipple changes.  No underarm lymphadenopathy.  Physical Exam Vitals and nursing note reviewed.  HENT:     Head: Normocephalic and atraumatic.     Mouth/Throat:     Pharynx: Oropharynx is clear.  Eyes:     Extraocular Movements: Extraocular movements intact.     Pupils: Pupils are equal, round, and reactive to light.  Cardiovascular:     Rate and Rhythm: Normal rate and regular rhythm.  Pulmonary:     Comments: Decreased breath sounds bilaterally.  Abdominal:     Palpations: Abdomen is soft.  Musculoskeletal:        General: Normal range of motion.     Cervical back: Normal range of motion.  Skin:    General: Skin is warm.  Neurological:     General: No focal deficit present.     Mental Status: She is alert and oriented to person, place, and time.  Psychiatric:        Behavior: Behavior normal.        Judgment: Judgment normal.     LABORATORY DATA:  I have reviewed the data as listed Lab Results  Component Value Date   WBC 8.2 10/12/2023   HGB 11.8 (L) 10/12/2023   HCT 34.6 (L) 10/12/2023   MCV 90.8 10/12/2023   PLT 318 10/12/2023   Recent Labs    09/12/23 1217 09/29/23 0951 10/12/23 0917  NA 143 141 139  K 3.2* 3.4* 3.5  CL 107 105 107  CO2 26 26 24   GLUCOSE 156* 115* 182*  BUN 19 10 20   CREATININE 0.59 0.73 0.64  CALCIUM 10.1 9.8 9.2  GFRNONAA >60 >60 >60   PROT 7.4  --  7.3  ALBUMIN 3.7  --  3.7  AST 18  --  24  ALT 15  --  20  ALKPHOS 98  --  91  BILITOT 0.3  --  0.4    RADIOGRAPHIC STUDIES: I have personally reviewed the radiological images as listed and agreed with the findings in the report. DG Chest Port 1 View Result Date: 10/02/2023 CLINICAL DATA:  Port-A-Cath placement EXAM: PORTABLE CHEST 1 VIEW COMPARISON:  10/01/2015 FINDINGS: Reverse apical lordotic positioning. Patient rotated minimally right. Left sided Port-A-Cath terminates over the high right atrium. Normal heart size. No pleural effusion or pneumothorax. Mild right hemidiaphragm elevation. Pleuroparenchymal scarring in the inferior right hemithorax is similar to 2017. Left base subsegmental atelectasis. IMPRESSION: Left-sided Port-A-Cath terminating over the high right atrium, without pneumothorax. Electronically Signed   By: Rockey Kilts M.D.   On: 10/02/2023 08:56   DG C-Arm 1-60 Min-No Report Result Date: 10/02/2023 Fluoroscopy was utilized by the requesting physician.  No radiographic  interpretation.   US  AXILLARY NODE CORE BIOPSY RIGHT Addendum Date: 09/22/2023 ADDENDUM REPORT: 09/22/2023 08:56 ADDENDUM: PATHOLOGY revealed: 1. Lymph node, needle/core biopsy, right axilla, butterfly clip - ONE LYMPH NODE, POSITIVE FOR METASTATIC CARCINOMA (1/1) - METASTATIC FOCUS: 4 MM - SUSPICIOUS FOR EXTRANODAL EXTENSION. Pathology results are CONCORDANT with imaging findings, per Dr. Norleen Croak. Pathology results and recommendations were discussed with patient via telephone on 09/22/2023. Patient reported biopsy site doing well with no adverse symptoms, and slight tenderness and bruising at the site. Post biopsy care instructions were reviewed, questions were answered and my direct phone number was provided. Patient was instructed to call [facility] for any additional questions or concerns related to biopsy site. Recommendation: The patient has a recent diagnosis of RIGHT breast  cancer and should follow her outlined treatment plan. The patient's care team was notified of biopsy results via secure EPIC message on September 22, 2023. Pathology results reported by Hendricks Benders, RN on 09/21/2023. Electronically Signed   By: Norleen Croak M.D.   On: 09/22/2023 08:56   Result Date: 09/22/2023 CLINICAL DATA:  Enlarged RIGHT axillary lymph node in the setting of known RIGHT breast cancer EXAM: US  AXILLARY NODE CORE BIOPSY RIGHT COMPARISON:  Previous exam(s). PROCEDURE: I met with the patient and we discussed the procedure of ultrasound-guided biopsy, including benefits and alternatives. We discussed the high likelihood of a successful procedure. We discussed the risks of the procedure, including infection, bleeding, tissue injury, clip migration, and inadequate sampling. Informed written consent was given. The usual time-out protocol was performed immediately prior to the procedure. Using sterile technique and 1% lidocaine  and 1% lidocaine  with epinephrine  as local anesthetic, under direct ultrasound visualization, a 14 gauge spring-loaded device was used to perform biopsy of the enlarged level 1 RIGHT axillary lymph node using a LATERAL approach. At the conclusion of the procedure a butterfly HydroMARK tissue marker clip was deployed into the biopsy cavity. Follow up 2 view mammogram was performed and dictated separately. IMPRESSION: Ultrasound guided biopsy of an enlarged RIGHT axillary lymph node. No apparent complications. Electronically Signed: By: Norleen Croak M.D. On: 09/20/2023 08:50   MM CLIP PLACEMENT RIGHT Result Date: 09/20/2023 CLINICAL DATA:  Status post same day ultrasound biopsy of a level 1 axillary lymph node EXAM: 3D DIAGNOSTIC RIGHT MAMMOGRAM POST ULTRASOUND BIOPSY COMPARISON:  Previous exam(s). ACR Breast Density Category b: There are scattered areas of fibroglandular density. FINDINGS: 3D Mammographic images were obtained following ultrasound guided biopsy of an enlarged level 1  RIGHT axillary lymph node. The biopsy marking clip is in expected position at the site of biopsy. Known RIGHT breast cancer and prominent intramammary lymph nodes are again seen, including biopsy-proven intramammary nodal metastasis. IMPRESSION: Appropriate positioning of the butterfly HydroMARK shaped biopsy marking clip at the site of biopsy in the RIGHT axilla. Final Assessment: Post Procedure Mammograms for Marker Placement Electronically Signed   By: Norleen Croak M.D.   On: 09/20/2023 08:52   US  AXILLA RIGHT Result Date: 09/20/2023 CLINICAL DATA:  Known RIGHT breast cancer with intramammary nodal metastases. Multiple suspicious intramammary nodes on MRI, with a single enlarged RIGHT axillary lymph node. Presents for biopsy of the axillary lymph node. EXAM: ULTRASOUND OF THE RIGHT AXILLA COMPARISON:  MRI dated September 14, 2023 FINDINGS: Ultrasound is performed, showing multiple normal lymph nodes with a single lobulated level 1 lymph node anteriorly within eccentric Leigh thickened cortex which is best demonstrated on the anti radial cine clip and on pre biopsy images (separately  performed and dictated). Its cortex measures up to 6 mm. A few prominent intramammary lymph nodes were incidentally noted in imaged, correlating with the prominent intramammary lymph nodes described as suspicious for malignancy on recent MRI. IMPRESSION: RIGHT level 1 axillary lymph node with eccentrically thickened cortex, correlating with the finding on MRI. Ultrasound-guided biopsy was subsequently performed and is separately dictated. RECOMMENDATION: Ultrasound-guided biopsy of the RIGHT axillary lymph node, performed immediately following this examination. I have discussed the findings and recommendations with the patient. If applicable, a reminder letter will be sent to the patient regarding the next appointment. BI-RADS CATEGORY  4: Suspicious. Electronically Signed   By: Norleen Croak M.D.   On: 09/20/2023 08:48   MR BREAST  BILATERAL W WO CONTRAST INC CAD Result Date: 09/15/2023 CLINICAL DATA:  Staging for recently diagnosed right breast invasive carcinoma. Patient presented with a palpable right breast mass for several months. She has a history of left breast carcinoma 2014, was followed for benign dystrophic calcifications in the lumpectomy bed and reportedly is BRCA1 gene mutation positive. Patient was found to have a 3.7 cm mass in the right breast on diagnostic imaging on 08/24/2023 as well as subcentimeter lymph nodes, which had increased in size since prior exams. Showing a biopsy of the right breast mass as well as an intramammary lymph node, both positive for carcinoma. EXAM: BILATERAL BREAST MRI WITH AND WITHOUT CONTRAST TECHNIQUE: Multiplanar, multisequence MR images of both breasts were obtained prior to and following the intravenous administration of 8 ml of Gadavist  Three-dimensional MR images were rendered by post-processing of the original MR data on an independent workstation. The three-dimensional MR images were interpreted, and findings are reported in the following complete MRI report for this study. Three dimensional images were evaluated at the independent interpreting workstation using the DynaCAD thin client. COMPARISON:  Prior exams.  No previous breast MRIs. FINDINGS: Breast composition: b. Scattered fibroglandular tissue. Background parenchymal enhancement: Mild Right breast: Large mass lies in the posterior aspect of the right breast, upper inner quadrant, heterogeneously enhancing, and containing areas of low T2 signal that do not enhance suspected to be post biopsy hemorrhage. Mass also contains a focus of susceptibility artifact consistent with the post biopsy marker clip. The mass measures 3.8 x 2.9 x 3.6 cm. The posterior aspect of the mass is in close proximity to the underlying pectoralis major muscle, but there is no evidence pectoralis invasion. Specifically, there is no abnormal T2 signal or  enhancement within the underlying pectoralis. Prominent susceptibility artifact from the coil shaped post biopsy marker clip is identified in the upper outer breast, middle depth, corresponding to the biopsy-proven metastatic intramammary lymph node. Posterior and superior to this is a second subcentimeter, but rounded intramammary lymph node. In the more inferior aspect of the lateral breast there are 2 small round enhancing masses consistent with additional lymph nodes noted on the recent diagnostic mammography and ultrasound, largest of these 4 mm. In the anterior aspect of the right axilla there is a lymph node with eccentric cortical thickening between 4 and 5 mm. There are no other masses or areas of abnormal enhancement. Left breast: No mass or abnormal enhancement. Post lumpectomy changes lie in the lateral breast. Lymph nodes: Single abnormal anterior right axillary lymph node with eccentric cortical thickening to 4-5 mm as detailed above. No other evidence of axillary lymphadenopathy. Ancillary findings:  None. IMPRESSION: 1. Biopsy-proven right breast malignancy measuring 3.8 cm in greatest dimension, closely approximating the underlying pectoralis major muscle,  but without evidence of pectoralis invasion. 2. Biopsy-proven metastatic subcentimeter right upper outer quadrant lymph node. There are 3 additional small rounded enhancing lymph nodes in the lateral right breast as detailed above, which may reflect additional metastatic disease. 3. Single abnormal anterior right axilla lymph node with an eccentric cortical thickening of 4-5 mm, suspicious for metastatic disease. 4. No evidence of left breast malignancy. Benign post lumpectomy changes on the left. RECOMMENDATION: 1. Treatment as planned for the known right breast malignancy. 2. Consider follow-up targeted ultrasound of the anterior right axilla and biopsy of the node with eccentric cortical thickening, if this would alter treatment. BI-RADS  CATEGORY  6: Known biopsy-proven malignancy. Electronically Signed   By: Alm Parkins M.D.   On: 09/15/2023 08:04     Carcinoma of upper-outer quadrant of right breast in female, estrogen receptor positive (HCC) # RIGHT breast- INVASIVE DUCTAL CARCINOMA WITH PAPILLARY FEATURES- T2 [3.7cm] & positive IMC intramammary lymph node-  cN0-positive for LVI ; grade 2 -ER 95% PR 80% HER2/neu 1+/negative. Ki-67 50%- ONE LYMPH NODE, POSITIVE FOR METASTATIC CARCINOMA (1/1); METASTATIC FOCUS: 4 MM; SUSPICIOUS FOR EXTRANODAL EXTENSION Dr.Cintron- ONCOTYPE- 28. Discussed the role of endocrine therapy-given ER/PR positive disease postsurgery; antihormone pill 1 a day for 10 years;  adjuvant Zometa. Also consider CDK- inhibitors/ PARP inhibitors post surgery. Given multifocality-large size of the tumor; given BRCA 1 positive- PLAN with mastectomy with contralateral prophylactic mastectomy.  # proceed with neo-adjuvant chemo-Recommend  Adriamycin-Cytoxan every 3  weeks x 4 cycles followed by weekly Taxol  x 12 cycles.  Given BRCA positive-recommend adding carboplatin  AUC 5 every 3 weeks.  Recommend growth factor support with Adriamycin and Cytoxan. Start with taxol  weekly first.     .  # GERD: stable.   # HTN:  stable.    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  # Remote history of lung cancer stage I-no concerns for any clinical recurrence.  # BRCA-positive- prophylactic contralateral mastectomy.  Consider bilateral prophylactic oophorectomy down the line.  # Social- FMLA/disability-    Weekly MD;labs;chemo  PS-  # DISPOSITION: # chemo today # refer to nutrition re: borderline diabetes # as per IS- Dr.B   Above plan of care was discussed with patient/family in detail.  My contact information was given to the patient/family.      Cindy JONELLE Joe, MD 10/12/2023 10:51 AM

## 2023-10-12 NOTE — Assessment & Plan Note (Addendum)
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 Ki-67 50%- ONE LYMPH NODE, POSITIVE FOR METASTATIC CARCINOMA (1/1); METASTATIC FOCUS: 4 MM; SUSPICIOUS FOR EXTRANODAL EXTENSION Dr.Cintron- ONCOTYPE- 28. Discussed the role of endocrine therapy-given ER/PR positive disease postsurgery; antihormone pill 1 a day for 10 years;  adjuvant Zometa. Also consider CDK- inhibitors/ PARP inhibitors post surgery. Given multifocality-large size of the tumor; given BRCA 1 positive- PLAN with mastectomy with contralateral prophylactic mastectomy.  # proceed with neo-adjuvant chemo-Recommend  Adriamycin-Cytoxan every 3  weeks x 4 cycles followed by weekly Taxol  x 12 cycles.  Given BRCA positive-recommend adding carboplatin  AUC 5 every 3 weeks.  Recommend growth factor support with Adriamycin and Cytoxan. Start with taxol  weekly first.     .  # GERD: stable.   # HTN:  stable.    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  # Remote history of lung cancer stage I-no concerns for any clinical recurrence.  # BRCA-positive- prophylactic contralateral mastectomy.  Consider bilateral prophylactic oophorectomy down the line.  # Social- FMLA/disability-    Weekly MD;labs;chemo  PS-  # DISPOSITION: # chemo today # refer to nutrition re: borderline diabetes # as per IS- Dr.B

## 2023-10-12 NOTE — Research (Signed)
 Trial:  Effectiveness of Out-of-Pocket Psychologist, forensic ([employer]) in Cancer Patients  Patient Connie Osborne was identified by this nurse as a potential candidate for the above listed study.  This Clinical Research Nurse met with Rinoa Garramone, [id number], on 10/12/23 in a manner and location that ensures patient privacy to discuss participation in the above listed research study.  Patient is Accompanied by her friend.  A copy of the informed consent document and separate HIPAA Authorization was provided to the patient with the protocol flyers.  Patient reads, speaks, and understands [place].   Patient was provided with the business card of this Nurse and encouraged to contact the research team with any questions.  Approximately 10 minutes were spent with the patient reviewing the informed consent documents.  Patient was provided the option of taking informed consent documents home to review and was encouraged to review at their convenience with their support network, including other care providers. Patient took the consent documents home to review.  Research nurse will meet with the patient the next appointment she has in the clinic to see if she is interested in participating in the study or not.  Reena Romans, RN 10/12/23 12:21 PM

## 2023-10-12 NOTE — Patient Instructions (Signed)

## 2023-10-16 ENCOUNTER — Telehealth: Payer: Self-pay | Admitting: *Deleted

## 2023-10-16 NOTE — Telephone Encounter (Signed)
 She wanted to hear from Dr. Rennie what she could take because she is on treatment but she has been having constipation and now she has hemorrhoids.  She would like Dr. Rennie to let her know what medicine that she could start with to help with hemorrhoids

## 2023-10-17 ENCOUNTER — Encounter
Admission: RE | Admit: 2023-10-17 | Discharge: 2023-10-17 | Disposition: A | Discharge status: Home / Self Care | Source: Ambulatory Visit | Attending: Internal Medicine | Admitting: Internal Medicine

## 2023-10-17 DIAGNOSIS — Z170 Estrogen receptor positive status [ER+]: Secondary | ICD-10-CM | POA: Insufficient documentation

## 2023-10-17 DIAGNOSIS — C50411 Malignant neoplasm of upper-outer quadrant of right female breast: Secondary | ICD-10-CM | POA: Insufficient documentation

## 2023-10-17 MED ORDER — TECHNETIUM TC 99M-LABELED RED BLOOD CELLS IV KIT
21.8500 | PACK | Freq: Once | INTRAVENOUS | Status: AC | PRN
  Administered 2023-10-17: 21.85 via INTRAVENOUS

## 2023-10-18 ENCOUNTER — Inpatient Hospital Stay

## 2023-10-18 ENCOUNTER — Ambulatory Visit

## 2023-10-18 ENCOUNTER — Other Ambulatory Visit

## 2023-10-18 ENCOUNTER — Encounter: Payer: Self-pay | Admitting: Internal Medicine

## 2023-10-18 ENCOUNTER — Inpatient Hospital Stay (HOSPITAL_BASED_OUTPATIENT_CLINIC_OR_DEPARTMENT_OTHER): Admitting: Internal Medicine

## 2023-10-18 VITALS — BP 142/96 | HR 77 | Temp 98.9°F | Resp 20 | Ht 65.0 in | Wt 177.0 lb

## 2023-10-18 VITALS — BP 138/83 | HR 76 | Resp 18

## 2023-10-18 DIAGNOSIS — C50411 Malignant neoplasm of upper-outer quadrant of right female breast: Secondary | ICD-10-CM

## 2023-10-18 DIAGNOSIS — Z170 Estrogen receptor positive status [ER+]: Secondary | ICD-10-CM

## 2023-10-18 DIAGNOSIS — Z5111 Encounter for antineoplastic chemotherapy: Secondary | ICD-10-CM | POA: Diagnosis not present

## 2023-10-18 LAB — CMP (CANCER CENTER ONLY)
ALT: 27 U/L (ref 0–44)
AST: 21 U/L (ref 15–41)
Albumin: 3.6 g/dL (ref 3.5–5.0)
Alkaline Phosphatase: 73 U/L (ref 38–126)
Anion gap: 7 (ref 5–15)
BUN: 22 mg/dL (ref 8–23)
CO2: 28 mmol/L (ref 22–32)
Calcium: 10.2 mg/dL (ref 8.9–10.3)
Chloride: 102 mmol/L (ref 98–111)
Creatinine: 0.69 mg/dL (ref 0.44–1.00)
GFR, Estimated: >60 mL/min (ref >60)
Glucose, Bld: 134 mg/dL — ABNORMAL HIGH (ref 70–99)
Potassium: 3.7 mmol/L (ref 3.5–5.1)
Sodium: 137 mmol/L (ref 135–145)
Total Bilirubin: 0.8 mg/dL (ref 0.0–1.2)
Total Protein: 7 g/dL (ref 6.5–8.1)

## 2023-10-18 LAB — CBC WITH DIFFERENTIAL (CANCER CENTER ONLY)
Abs Immature Granulocytes: 0.01 K/uL (ref 0.00–0.07)
Basophils Absolute: 0.1 K/uL (ref 0.0–0.1)
Basophils Relative: 1 %
Eosinophils Absolute: 0.2 K/uL (ref 0.0–0.5)
Eosinophils Relative: 3 %
HCT: 33.5 % — ABNORMAL LOW (ref 36.0–46.0)
Hemoglobin: 11.4 g/dL — ABNORMAL LOW (ref 12.0–15.0)
Immature Granulocytes: 0 %
Lymphocytes Relative: 33 %
Lymphs Abs: 1.9 K/uL (ref 0.7–4.0)
MCH: 30.2 pg (ref 26.0–34.0)
MCHC: 34 g/dL (ref 30.0–36.0)
MCV: 88.6 fL (ref 80.0–100.0)
Monocytes Absolute: 0.1 K/uL (ref 0.1–1.0)
Monocytes Relative: 2 %
Neutro Abs: 3.6 K/uL (ref 1.7–7.7)
Neutrophils Relative %: 61 %
Platelet Count: 302 K/uL (ref 150–400)
RBC: 3.78 MIL/uL — ABNORMAL LOW (ref 3.87–5.11)
RDW: 12.5 % (ref 11.5–15.5)
Smear Review: NORMAL
WBC Count: 5.9 K/uL (ref 4.0–10.5)
nRBC: 0 % (ref 0.0–0.2)

## 2023-10-18 MED ORDER — DEXAMETHASONE SODIUM PHOSPHATE 10 MG/ML IJ SOLN
10.0000 mg | Freq: Once | INTRAMUSCULAR | Status: AC
  Administered 2023-10-18: 10 mg via INTRAVENOUS
  Filled 2023-10-18: qty 1

## 2023-10-18 MED ORDER — DIPHENHYDRAMINE HCL 50 MG/ML IJ SOLN
50.0000 mg | Freq: Once | INTRAMUSCULAR | Status: AC
  Administered 2023-10-18: 50 mg via INTRAVENOUS
  Filled 2023-10-18: qty 1

## 2023-10-18 MED ORDER — FAMOTIDINE IN NACL 20-0.9 MG/50ML-% IV SOLN
20.0000 mg | Freq: Once | INTRAVENOUS | Status: AC
  Administered 2023-10-18: 20 mg via INTRAVENOUS
  Filled 2023-10-18: qty 50

## 2023-10-18 MED ORDER — SODIUM CHLORIDE 0.9 % IV SOLN
INTRAVENOUS | Status: DC
Start: 2023-10-18 — End: 2023-10-18
  Filled 2023-10-18: qty 250

## 2023-10-18 MED ORDER — SODIUM CHLORIDE 0.9 % IV SOLN
80.0000 mg/m2 | Freq: Once | INTRAVENOUS | Status: AC
  Administered 2023-10-18: 156 mg via INTRAVENOUS
  Filled 2023-10-18: qty 26

## 2023-10-18 NOTE — Patient Instructions (Signed)
 FOR heart burn-recommend Prilosec or Nexium over the counter- in AM prior to breakfast. 60 minutes prior to meal.  Sitting upright for 2 to 3 hours post a meal.  Also. Recommend TUMS [over the counter]

## 2023-10-18 NOTE — Assessment & Plan Note (Addendum)
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 Ki-67 50%- ONE LYMPH NODE, POSITIVE FOR METASTATIC CARCINOMA (1/1); METASTATIC FOCUS: 4 MM; SUSPICIOUS FOR EXTRANODAL EXTENSION Dr.Cintron- ONCOTYPE- 28. Discussed the role of endocrine therapy-given ER/PR positive disease postsurgery; antihormone pill 1 a day for 10 years;  adjuvant Zometa. Also consider CDK- inhibitors/ PARP inhibitors post surgery. Given multifocality-large size of the tumor; given BRCA 1 positive- PLAN with mastectomy with contralateral prophylactic mastectomy. AUG 18th, 2025- [facility] scan-ejection fraction of 56%.# ON neo-adjuvant chemo- taxol  weekly- with carbo q 3W; and followed by  Adriamycin-Cytoxan every 3  weeks x 4 cycles  # Currently- ON neo-adjuvant chemo- taxol  weekly- with carbo q 3W; Proceed with cycle #1- day-8. Labs-CBC/chemistries were reviewed with the patient.      .  # GERD: # re: heart burn-recommend Prilosec or Nexium over the counter- in AM prior to breakfast. 60 minutes prior to meal.  Sitting upright for 2 to 3 hours post a meal.  Also. Recommend TUMS [over the counter]  # HTN:  stable.    # Borderline DM-PBG- 134- awaiting  nutrition- need to monitor closely on chemotherapy.  # Remote history of lung cancer stage I-no concerns for any clinical recurrence.  # BRCA-positive- prophylactic contralateral mastectomy.  Consider bilateral prophylactic oophorectomy down the line.  # Social- FMLA/disability-    Weeklylabs;chemo- D-8- NO MD-   PS-  # DISPOSITION: # chemo today # as per IS- Dr.B

## 2023-10-18 NOTE — Patient Instructions (Signed)

## 2023-10-18 NOTE — Progress Notes (Signed)
 C/o still coughing but getting better. Having more acid reflux, can her tx cause this? Using pepcid , this helps.  10/17/23 MUGA.

## 2023-10-18 NOTE — Progress Notes (Signed)
 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   # JULY-AUG 2025- RIGHT BREAST T2; intramammary lymph node positive- N-1- ER-PR POSITIVE: her 2 neg; Ki-67-50%; right breast cancer with T2N1- ER/PR + her 2 NEG breast cancer.   # AUG 14th, 2025-   # Colonoscopy [Dr.sankar-Neg June 2017]  # Smoker; BRCA-1 positive   Primary cancer of right upper lobe of lung (HCC)  10/26/2015 Initial Diagnosis   Primary cancer of right upper lobe of lung (HCC)   Carcinoma of upper-outer quadrant of right breast in female, estrogen receptor positive (HCC)  09/12/2023 Initial Diagnosis   Carcinoma of upper-outer quadrant of right breast in female, estrogen receptor positive (HCC)   09/12/2023 Cancer Staging   Staging form: Breast, AJCC 8th Edition - Clinical: Stage IIA (cT2, cN1, cM0, G2, ER+, PR+, HER2-) - Signed by Rennie Cindy SAUNDERS, MD on 09/29/2023 Histologic grading system: 3 grade system   10/12/2023 -  Chemotherapy   Patient is on Treatment Plan : BREAST Paclitaxel  q7d / AC q21d       HISTORY OF PRESENTING ILLNESS: Patient ambulating-independently. ALONE>   Connie Osborne 63 y.o.  female pleasant patient with  remote history of stage I lung cancer s/p surgery; and BRCA-1- right breast cancer with T2N1- ER/PR + her 2 NEG breast cancer.   C/o still coughing but getting better. Having more acid reflux.  Using pepcid , this helps.   Patient denies any unusual shortness of breath or bone pain.    Review of Systems  Constitutional:  Positive for malaise/fatigue. Negative for chills, diaphoresis, fever and weight loss.  HENT:  Negative for nosebleeds and sore throat.   Eyes:  Negative for double vision.  Respiratory:  Negative for cough, hemoptysis, sputum production, shortness of breath and wheezing.   Cardiovascular:  Negative for chest pain, palpitations, orthopnea and leg swelling.  Gastrointestinal:  Negative for abdominal pain, blood in stool, constipation, diarrhea, heartburn, melena, nausea and vomiting.  Genitourinary:  Negative for dysuria, frequency and urgency.  Musculoskeletal:  Positive for back pain and joint pain.  Skin: Negative.  Negative for itching and rash.  Neurological:  Negative for dizziness, tingling, focal weakness, weakness and headaches.  Endo/Heme/Allergies:  Does not bruise/bleed easily.  Psychiatric/Behavioral:  Negative for depression. The patient is not nervous/anxious and does not have insomnia.     MEDICAL HISTORY:  Past Medical History:  Diagnosis Date   Allergy    Anxiety    Arthritis    Breast cancer (HCC) 2014   Left- Radiation; BRCA 1 +    COPD (chronic obstructive pulmonary disease) (HCC)    Coughing up blood    GERD (gastroesophageal reflux disease)    Hypertension    Lung cancer (HCC)    Carcinoma, right upper lobe    Personal history of radiation therapy    Pre-diabetes    Shortness of breath dyspnea    Vitamin D deficiency     SURGICAL HISTORY: Past Surgical  History:  Procedure Laterality Date   ABDOMINAL HYSTERECTOMY     BREAST BIOPSY Left 2014   +   BREAST BIOPSY Left 06/11/2019   stereo bx, x-clip, negative   BREAST BIOPSY Right 08/30/2023   US  RT BREAST BX W LOC DEV 1ST LESION IMG BX SPEC US  GUIDE 08/30/2023 [facility]   BREAST BIOPSY Right 08/30/2023   US  RT BREAST BX W LOC DEV EA ADD LESION IMG BX SPEC US  GUIDE 08/30/2023  [facility]   BREAST EXCISIONAL BIOPSY Left 2014   BREAST LUMPECTOMY Left 2014   BREAST SURGERY Left 2014   lumpectomy   COLONOSCOPY WITH PROPOFOL  N/A 08/18/2015   Procedure: COLONOSCOPY WITH PROPOFOL ;  Surgeon: Louanne KANDICE Muse, MD;  Location: [facility];  Service: Endoscopy;  Laterality: N/A;   COLONOSCOPY WITH PROPOFOL  N/A 10/12/2016   Procedure: COLONOSCOPY WITH PROPOFOL ;  Surgeon: Muse Louanne KANDICE, MD;  Location: [facility];  Service: Endoscopy;  Laterality: N/A;   ELECTROMAGNETIC NAVIGATION BROCHOSCOPY Right 07/28/2015   Procedure: ELECTROMAGNETIC NAVIGATION BRONCHOSCOPY;  Surgeon: Nickolas Cellar, MD;  Location: [facility];  Service: Cardiopulmonary;  Laterality: Right;   FOOT SURGERY     FRACTURE SURGERY     GANGLION CYST EXCISION     PORTACATH PLACEMENT N/A 10/02/2023   Procedure: INSERTION, TUNNELED CENTRAL VENOUS DEVICE, WITH PORT;  Surgeon: Rodolph Romano, MD;  Location: [facility];  Service: General;  Laterality: N/A;   THORACOTOMY/LOBECTOMY Right 09/14/2015   Procedure: THORACOTOMY/LOBECTOMY;  Surgeon: Louanne KANDICE Muse, MD;  Location: [facility];  Service: Thoracic;  Laterality: Right;    SOCIAL HISTORY: Social History   Socioeconomic History   Marital status: Single    Spouse name: Not on file   Number of children: Not on file   Years of education: Not on file   Highest education level: Not on file  Occupational History   Not on file  Tobacco Use   Smoking status: Former    Current packs/day: 0.00    Average packs/day: 1 pack/day for 30.0 years (30.0 ttl pk-yrs)    Types: Cigarettes    Start date: 07/29/1985    Quit date: 07/30/2015    Years since quitting: 8.2   Smokeless tobacco: Never  Vaping Use   Vaping status: Never Used  Substance and Sexual Activity   Alcohol use: No    Alcohol/week: 0.0 standard drinks of alcohol   Drug use: No   Sexual activity: Not on file  Other Topics Concern   Not on file  Social History Narrative    Lives alone   Social Drivers of Health   Financial Resource Strain: Medium Risk (09/07/2023)   Received from [facility]   Overall Financial Resource Strain (CARDIA)    Difficulty of Paying Living Expenses: Somewhat hard  Food Insecurity: No Food Insecurity (09/12/2023)   Hunger Vital Sign    Worried About Running Out of Food in the Last Year: Never true    Ran Out of Food in the Last Year: Never true  Recent Concern: Food Insecurity - Food Insecurity Present (09/07/2023)   Received from [facility]   Hunger Vital Sign    Within the past 12 months, you worried that your food would run out before you got the money to buy more.: Sometimes true    Within the past 12 months, the food you bought just didn't last and you didn't have money to get more.: Never true  Transportation Needs: No Transportation Needs (09/12/2023)   PRAPARE - Transportation  Lack of Transportation (Medical): No    Lack of Transportation (Non-Medical): No  Physical Activity: Not on file  Stress: Not on file  Social Connections: Not on file  Intimate Partner Violence: Not At Risk (09/12/2023)   Humiliation, Afraid, Rape, and Kick questionnaire    Fear of Current or Ex-Partner: No    Emotionally Abused: No    Physically Abused: No    Sexually Abused: No    FAMILY HISTORY: Family History  Problem Relation Age of Onset   Hypertension Mother    Diabetes Mellitus II Mother    Cancer Mother        mets   Cancer Father        long cancer   Cancer Sister 78       breast   Breast cancer Sister 92   Cancer Sister 64       breast   Breast cancer Sister 4   Lung cancer Brother    Cancer Other        breast    ALLERGIES:  is allergic to accupril [quinapril hcl] and percocet [oxycodone -acetaminophen ].  MEDICATIONS:  Current Outpatient Medications  Medication Sig Dispense Refill   amLODipine  (NORVASC ) 10 MG tablet Take 10 mg by mouth daily with lunch.      Calcium  Carb-Cholecalciferol (CALCIUM + VITAMIN D3 PO) Take 1 tablet by mouth in the morning and at bedtime.     cetirizine (ZYRTEC) 10 MG tablet Take 10 mg by mouth as needed for allergies.     famotidine  (PEPCID ) 20 MG tablet Take 20 mg by mouth daily as needed for heartburn or indigestion.     lidocaine -prilocaine  (EMLA ) cream Apply on the port. 30 -45 min  prior to port access. 30 g 3   losartan  (COZAAR ) 100 MG tablet Take 100 mg by mouth daily with lunch.      metoprolol  succinate (TOPROL -XL) 25 MG 24 hr tablet Take 25 mg by mouth daily with lunch.      ondansetron  (ZOFRAN ) 8 MG tablet One pill every 8 hours as needed for nausea/vomitting. 40 tablet 1   Potassium Chloride  ER 20 MEQ TBCR Take 1 tablet by mouth daily.     prochlorperazine  (COMPAZINE ) 10 MG tablet Take 1 tablet (10 mg total) by mouth every 6 (six) hours as needed for nausea or vomiting. 30 tablet 0   traMADol  (ULTRAM ) 50 MG tablet Take 1 tablet (50 mg total) by mouth every 6 (six) hours as needed. 10 tablet 0   No current facility-administered medications for this visit.   Facility-Administered Medications Ordered in Other Visits  Medication Dose Route Frequency Provider Last Rate Last Admin   0.9 %  sodium chloride  infusion   Intravenous Continuous Madysyn Hanken R, MD 10 mL/hr at 10/18/23 1036 New Bag at 10/18/23 1036   PACLitaxel  (TAXOL ) 156 mg in sodium chloride  0.9 % 250 mL chemo infusion (</= 80mg /m2)  80 mg/m2 (Treatment Plan Recorded) Intravenous Once Jenica Costilow R, MD        PHYSICAL EXAMINATION:   Vitals:   10/18/23 0913 10/18/23 0931  BP: (!) 139/91 (!) 142/96  Pulse: 77   Resp: 20   Temp: 98.9 F (37.2 C)   SpO2: 97%    Filed Weights   10/18/23 0913  Weight: 177 lb (80.3 kg)   Right breast 12 o'clock position-approximately 3 to 4 cm mass-slightly mobile.  No skin changes no nipple changes.  No underarm lymphadenopathy.  Physical Exam Vitals and nursing note reviewed.  HENT:     Head:  Normocephalic and atraumatic.     Mouth/Throat:     Pharynx: Oropharynx is clear.  Eyes:     Extraocular Movements: Extraocular movements intact.     Pupils: Pupils are equal, round, and reactive to light.  Cardiovascular:     Rate and Rhythm: Normal rate and regular rhythm.  Pulmonary:     Comments: Decreased breath sounds bilaterally.  Abdominal:     Palpations: Abdomen is soft.  Musculoskeletal:        General: Normal range of motion.     Cervical back: Normal range of motion.  Skin:    General: Skin is warm.  Neurological:     General: No focal deficit present.     Mental Status: She is alert and oriented to person, place, and time.  Psychiatric:        Behavior: Behavior normal.        Judgment: Judgment normal.     LABORATORY DATA:  I have reviewed the data as listed Lab Results  Component Value Date   WBC 5.9 10/18/2023   HGB 11.4 (L) 10/18/2023   HCT 33.5 (L) 10/18/2023   MCV 88.6 10/18/2023   PLT 302 10/18/2023   Recent Labs    09/12/23 1217 09/29/23 0951 10/12/23 0917 10/18/23 0917  NA 143 141 139 137  K 3.2* 3.4* 3.5 3.7  CL 107 105 107 102  CO2 26 26 24 28   GLUCOSE 156* 115* 182* 134*  BUN 19 10 20 22   CREATININE 0.59 0.73 0.64 0.69  CALCIUM 10.1 9.8 9.2 10.2  GFRNONAA >60 >60 >60 >60  PROT 7.4  --  7.3 7.0  ALBUMIN 3.7  --  3.7 3.6  AST 18  --  24 21  ALT 15  --  20 27  ALKPHOS 98  --  91 73  BILITOT 0.3  --  0.4 0.8    RADIOGRAPHIC STUDIES: I have personally reviewed the radiological images as listed and agreed with the findings in the report. NM Cardiac Muga Rest Result Date: 10/17/2023 CLINICAL DATA:  Breast cancer. Evaluate left ventricular function pre chemotherapy. EXAM: NUCLEAR MEDICINE CARDIAC BLOOD POOL IMAGING (MUGA) TECHNIQUE: Cardiac multi-gated acquisition was performed at rest following intravenous injection of Tc-45m labeled red blood cells. RADIOPHARMACEUTICALS:  21.85 mCi Tc-55m pertechnetate in-vitro labeled red blood cells  IV COMPARISON:  None Available. FINDINGS: Normal left ventricular wall motion. No akinetic or dyskinetic segments are identified. The ejection fraction is calculated at 56%. IMPRESSION: Normal left ventricular wall motion with ejection fraction of 56%. Electronically Signed   By: MYRTIS Stammer M.D.   On: 10/17/2023 14:34   DG Chest Port 1 View Result Date: 10/02/2023 CLINICAL DATA:  Port-A-Cath placement EXAM: PORTABLE CHEST 1 VIEW COMPARISON:  10/01/2015 FINDINGS: Reverse apical lordotic positioning. Patient rotated minimally right. Left sided Port-A-Cath terminates over the high right atrium. Normal heart size. No pleural effusion or pneumothorax. Mild right hemidiaphragm elevation. Pleuroparenchymal scarring in the inferior right hemithorax is similar to 2017. Left base subsegmental atelectasis. IMPRESSION: Left-sided Port-A-Cath terminating over the high right atrium, without pneumothorax. Electronically Signed   By: Rockey Kilts M.D.   On: 10/02/2023 08:56   DG C-Arm 1-60 Min-No Report Result Date: 10/02/2023 Fluoroscopy was utilized by the requesting physician.  No radiographic interpretation.   US  AXILLARY NODE CORE BIOPSY RIGHT Addendum Date: 09/22/2023 ADDENDUM REPORT: 09/22/2023 08:56 ADDENDUM: PATHOLOGY revealed: 1. Lymph node, needle/core biopsy, right axilla, butterfly clip - ONE LYMPH NODE,  POSITIVE FOR METASTATIC CARCINOMA (1/1) - METASTATIC FOCUS: 4 MM - SUSPICIOUS FOR EXTRANODAL EXTENSION. Pathology results are CONCORDANT with imaging findings, per Dr. Norleen Croak. Pathology results and recommendations were discussed with patient via telephone on 09/22/2023. Patient reported biopsy site doing well with no adverse symptoms, and slight tenderness and bruising at the site. Post biopsy care instructions were reviewed, questions were answered and my direct phone number was provided. Patient was instructed to call [facility] for any additional questions or concerns related to biopsy  site. Recommendation: The patient has a recent diagnosis of RIGHT breast cancer and should follow her outlined treatment plan. The patient's care team was notified of biopsy results via secure EPIC message on September 22, 2023. Pathology results reported by Hendricks Benders, RN on 09/21/2023. Electronically Signed   By: Norleen Croak M.D.   On: 09/22/2023 08:56   Result Date: 09/22/2023 CLINICAL DATA:  Enlarged RIGHT axillary lymph node in the setting of known RIGHT breast cancer EXAM: US  AXILLARY NODE CORE BIOPSY RIGHT COMPARISON:  Previous exam(s). PROCEDURE: I met with the patient and we discussed the procedure of ultrasound-guided biopsy, including benefits and alternatives. We discussed the high likelihood of a successful procedure. We discussed the risks of the procedure, including infection, bleeding, tissue injury, clip migration, and inadequate sampling. Informed written consent was given. The usual time-out protocol was performed immediately prior to the procedure. Using sterile technique and 1% lidocaine  and 1% lidocaine  with epinephrine  as local anesthetic, under direct ultrasound visualization, a 14 gauge spring-loaded device was used to perform biopsy of the enlarged level 1 RIGHT axillary lymph node using a LATERAL approach. At the conclusion of the procedure a butterfly HydroMARK tissue marker clip was deployed into the biopsy cavity. Follow up 2 view mammogram was performed and dictated separately. IMPRESSION: Ultrasound guided biopsy of an enlarged RIGHT axillary lymph node. No apparent complications. Electronically Signed: By: Norleen Croak M.D. On: 09/20/2023 08:50   MM CLIP PLACEMENT RIGHT Result Date: 09/20/2023 CLINICAL DATA:  Status post same day ultrasound biopsy of a level 1 axillary lymph node EXAM: 3D DIAGNOSTIC RIGHT MAMMOGRAM POST ULTRASOUND BIOPSY COMPARISON:  Previous exam(s). ACR Breast Density Category b: There are scattered areas of fibroglandular density. FINDINGS: 3D Mammographic  images were obtained following ultrasound guided biopsy of an enlarged level 1 RIGHT axillary lymph node. The biopsy marking clip is in expected position at the site of biopsy. Known RIGHT breast cancer and prominent intramammary lymph nodes are again seen, including biopsy-proven intramammary nodal metastasis. IMPRESSION: Appropriate positioning of the butterfly HydroMARK shaped biopsy marking clip at the site of biopsy in the RIGHT axilla. Final Assessment: Post Procedure Mammograms for Marker Placement Electronically Signed   By: Norleen Croak M.D.   On: 09/20/2023 08:52   US  AXILLA RIGHT Result Date: 09/20/2023 CLINICAL DATA:  Known RIGHT breast cancer with intramammary nodal metastases. Multiple suspicious intramammary nodes on MRI, with a single enlarged RIGHT axillary lymph node. Presents for biopsy of the axillary lymph node. EXAM: ULTRASOUND OF THE RIGHT AXILLA COMPARISON:  MRI dated September 14, 2023 FINDINGS: Ultrasound is performed, showing multiple normal lymph nodes with a single lobulated level 1 lymph node anteriorly within eccentric Leigh thickened cortex which is best demonstrated on the anti radial cine clip and on pre biopsy images (separately performed and dictated). Its cortex measures up to 6 mm. A few prominent intramammary lymph nodes were incidentally noted in imaged, correlating with the prominent intramammary lymph nodes described as suspicious for  malignancy on recent MRI. IMPRESSION: RIGHT level 1 axillary lymph node with eccentrically thickened cortex, correlating with the finding on MRI. Ultrasound-guided biopsy was subsequently performed and is separately dictated. RECOMMENDATION: Ultrasound-guided biopsy of the RIGHT axillary lymph node, performed immediately following this examination. I have discussed the findings and recommendations with the patient. If applicable, a reminder letter will be sent to the patient regarding the next appointment. BI-RADS CATEGORY  4: Suspicious.  Electronically Signed   By: Norleen Croak M.D.   On: 09/20/2023 08:48     Carcinoma of upper-outer quadrant of right breast in female, estrogen receptor positive (HCC) # RIGHT breast- INVASIVE DUCTAL CARCINOMA WITH PAPILLARY FEATURES- T2 [3.7cm] & positive IMC intramammary lymph node-  cN0-positive for LVI ; grade 2 -ER 95% PR 80% HER2/neu 1+/negative. Ki-67 50%- ONE LYMPH NODE, POSITIVE FOR METASTATIC CARCINOMA (1/1); METASTATIC FOCUS: 4 MM; SUSPICIOUS FOR EXTRANODAL EXTENSION Dr.Cintron- ONCOTYPE- 28. Discussed the role of endocrine therapy-given ER/PR positive disease postsurgery; antihormone pill 1 a day for 10 years;  adjuvant Zometa. Also consider CDK- inhibitors/ PARP inhibitors post surgery. Given multifocality-large size of the tumor; given BRCA 1 positive- PLAN with mastectomy with contralateral prophylactic mastectomy. AUG 18th, 2025- [facility] scan-ejection fraction of 56%.# ON neo-adjuvant chemo- taxol  weekly- with carbo q 3W; and followed by  Adriamycin-Cytoxan every 3  weeks x 4 cycles  # Currently- ON neo-adjuvant chemo- taxol  weekly- with carbo q 3W; Proceed with cycle #1- day-8. Labs-CBC/chemistries were reviewed with the patient.      .  # GERD: # re: heart burn-recommend Prilosec or Nexium over the counter- in AM prior to breakfast. 60 minutes prior to meal.  Sitting upright for 2 to 3 hours post a meal.  Also. Recommend TUMS [over the counter]  # HTN:  stable.    # Borderline DM-PBG- 134- awaiting  nutrition- need to monitor closely on chemotherapy.  # Remote history of lung cancer stage I-no concerns for any clinical recurrence.  # BRCA-positive- prophylactic contralateral mastectomy.  Consider bilateral prophylactic oophorectomy down the line.  # Social- FMLA/disability-    Weeklylabs;chemo- D-8- NO MD-   PS-  # DISPOSITION: # chemo today # as per [facility]- Dr.B   Above plan of care was discussed with patient/family in detail.  My contact information was given to the  patient/family.      Cindy JONELLE Joe, MD 10/18/2023 10:58 AM

## 2023-10-19 ENCOUNTER — Encounter: Payer: Self-pay | Admitting: Internal Medicine

## 2023-10-24 ENCOUNTER — Encounter: Payer: Self-pay | Admitting: Internal Medicine

## 2023-10-25 ENCOUNTER — Inpatient Hospital Stay: Admitting: Internal Medicine

## 2023-10-25 ENCOUNTER — Inpatient Hospital Stay

## 2023-10-25 ENCOUNTER — Other Ambulatory Visit: Payer: Self-pay | Admitting: *Deleted

## 2023-10-25 ENCOUNTER — Encounter: Payer: Self-pay | Admitting: Internal Medicine

## 2023-10-25 ENCOUNTER — Telehealth: Payer: Self-pay | Admitting: *Deleted

## 2023-10-25 VITALS — BP 129/75 | HR 77

## 2023-10-25 VITALS — BP 127/82 | HR 76 | Temp 98.0°F | Resp 17 | Wt 179.0 lb

## 2023-10-25 DIAGNOSIS — Z170 Estrogen receptor positive status [ER+]: Secondary | ICD-10-CM

## 2023-10-25 DIAGNOSIS — C50411 Malignant neoplasm of upper-outer quadrant of right female breast: Secondary | ICD-10-CM

## 2023-10-25 DIAGNOSIS — Z5111 Encounter for antineoplastic chemotherapy: Secondary | ICD-10-CM | POA: Diagnosis not present

## 2023-10-25 LAB — CMP (CANCER CENTER ONLY)
ALT: 30 U/L (ref 0–44)
AST: 26 U/L (ref 15–41)
Albumin: 3.6 g/dL (ref 3.5–5.0)
Alkaline Phosphatase: 77 U/L (ref 38–126)
Anion gap: 8 (ref 5–15)
BUN: 11 mg/dL (ref 8–23)
CO2: 24 mmol/L (ref 22–32)
Calcium: 9.3 mg/dL (ref 8.9–10.3)
Chloride: 107 mmol/L (ref 98–111)
Creatinine: 0.63 mg/dL (ref 0.44–1.00)
GFR, Estimated: >60 mL/min (ref >60)
Glucose, Bld: 162 mg/dL — ABNORMAL HIGH (ref 70–99)
Potassium: 3.6 mmol/L (ref 3.5–5.1)
Sodium: 139 mmol/L (ref 135–145)
Total Bilirubin: 0.3 mg/dL (ref 0.0–1.2)
Total Protein: 6.7 g/dL (ref 6.5–8.1)

## 2023-10-25 LAB — CBC WITH DIFFERENTIAL (CANCER CENTER ONLY)
Abs Immature Granulocytes: 0.02 K/uL (ref 0.00–0.07)
Basophils Absolute: 0.1 K/uL (ref 0.0–0.1)
Basophils Relative: 2 %
Eosinophils Absolute: 0.1 K/uL (ref 0.0–0.5)
Eosinophils Relative: 3 %
HCT: 31.2 % — ABNORMAL LOW (ref 36.0–46.0)
Hemoglobin: 10.6 g/dL — ABNORMAL LOW (ref 12.0–15.0)
Immature Granulocytes: 0 %
Lymphocytes Relative: 44 %
Lymphs Abs: 2 K/uL (ref 0.7–4.0)
MCH: 30.8 pg (ref 26.0–34.0)
MCHC: 34 g/dL (ref 30.0–36.0)
MCV: 90.7 fL (ref 80.0–100.0)
Monocytes Absolute: 0.3 K/uL (ref 0.1–1.0)
Monocytes Relative: 7 %
Neutro Abs: 2.1 K/uL (ref 1.7–7.7)
Neutrophils Relative %: 44 %
Platelet Count: 395 K/uL (ref 150–400)
RBC: 3.44 MIL/uL — ABNORMAL LOW (ref 3.87–5.11)
RDW: 12.3 % (ref 11.5–15.5)
WBC Count: 4.6 K/uL (ref 4.0–10.5)
nRBC: 0 % (ref 0.0–0.2)

## 2023-10-25 MED ORDER — METFORMIN HCL ER (MOD) 1000 MG PO TB24: 1000.0000 mg | ORAL_TABLET | Freq: Every day | ORAL | 1 refills | Status: DC

## 2023-10-25 MED ORDER — SODIUM CHLORIDE 0.9 % IV SOLN
80.0000 mg/m2 | Freq: Once | INTRAVENOUS | Status: AC
  Administered 2023-10-25: 156 mg via INTRAVENOUS
  Filled 2023-10-25: qty 26

## 2023-10-25 MED ORDER — METFORMIN HCL 500 MG PO TABS: 1000.0000 mg | ORAL_TABLET | Freq: Every day | ORAL | 3 refills | Status: DC

## 2023-10-25 MED ORDER — DIPHENHYDRAMINE HCL 50 MG/ML IJ SOLN
50.0000 mg | Freq: Once | INTRAMUSCULAR | Status: AC
  Administered 2023-10-25: 50 mg via INTRAVENOUS
  Filled 2023-10-25: qty 1

## 2023-10-25 MED ORDER — DEXAMETHASONE SODIUM PHOSPHATE 10 MG/ML IJ SOLN
10.0000 mg | Freq: Once | INTRAMUSCULAR | Status: AC
  Administered 2023-10-25: 10 mg via INTRAVENOUS
  Filled 2023-10-25: qty 1

## 2023-10-25 MED ORDER — FAMOTIDINE IN NACL 20-0.9 MG/50ML-% IV SOLN
20.0000 mg | Freq: Once | INTRAVENOUS | Status: AC
  Administered 2023-10-25: 20 mg via INTRAVENOUS
  Filled 2023-10-25: qty 50

## 2023-10-25 MED ORDER — METFORMIN HCL ER 500 MG PO TB24: 1000.0000 mg | ORAL_TABLET | Freq: Every day | ORAL | 3 refills | Status: DC

## 2023-10-25 MED ORDER — SODIUM CHLORIDE 0.9 % IV SOLN
INTRAVENOUS | Status: DC
  Filled 2023-10-25 (×2): qty 250

## 2023-10-25 NOTE — Progress Notes (Signed)
 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   # JULY-AUG 2025- RIGHT BREAST T2; intramammary lymph node positive- N-1- ER-PR POSITIVE: her 2 neg; Ki-67-50%; right breast cancer with T2N1- ER/PR + her 2 NEG breast cancer.   # AUG 14th, 2025-   # Colonoscopy [Dr.sankar-Neg June 2017]  # Smoker; BRCA-1 positive   Primary cancer of right upper lobe of lung (HCC)  10/26/2015 Initial Diagnosis   Primary cancer of right upper lobe of lung (HCC)   Carcinoma of upper-outer quadrant of right breast in female, estrogen receptor positive (HCC)  09/12/2023 Initial Diagnosis   Carcinoma of upper-outer quadrant of right breast in female, estrogen receptor positive (HCC)   09/12/2023 Cancer Staging   Staging form: Breast, AJCC 8th Edition - Clinical: Stage IIA (cT2, cN1, cM0, G2, ER+, PR+, HER2-) - Signed by Rennie Cindy SAUNDERS, MD on 09/29/2023 Histologic grading system: 3 grade system   10/12/2023 -  Chemotherapy   Patient is on Treatment Plan : BREAST Paclitaxel  q7d / AC q21d       HISTORY OF PRESENTING ILLNESS: Patient ambulating-independently. ALONE>   Karinda Cabriales 63 y.o.  female pleasant patient with  remote history of stage I lung cancer s/p surgery; and BRCA-1- right breast cancer with T2N1- ER/PR + her 2 NEG breast cancer on neoadjuvant chemotherapy.   Patient denies any unusual shortness of  breath or bone pain.  Nausea- mild no vomiting. Improved with anti-emetics. No tingling or numbness.    Review of Systems  Constitutional:  Positive for malaise/fatigue. Negative for chills, diaphoresis, fever and weight loss.  HENT:  Negative for nosebleeds and sore throat.   Eyes:  Negative for double vision.  Respiratory:  Negative for cough, hemoptysis, sputum production, shortness of breath and wheezing.   Cardiovascular:  Negative for chest pain, palpitations, orthopnea and leg swelling.  Gastrointestinal:  Negative for abdominal pain, blood in stool, constipation, diarrhea, heartburn, melena, nausea and vomiting.  Genitourinary:  Negative for dysuria, frequency and urgency.  Musculoskeletal:  Positive for back pain and joint pain.  Skin: Negative.  Negative for itching and rash.  Neurological:  Negative for dizziness, tingling, focal weakness, weakness and headaches.  Endo/Heme/Allergies:  Does not bruise/bleed easily.  Psychiatric/Behavioral:  Negative for depression. The patient is not nervous/anxious and does not have insomnia.     MEDICAL HISTORY:  Past Medical History:  Diagnosis Date   Allergy    Anxiety    Arthritis    Breast cancer (HCC) 2014   Left- Radiation; BRCA 1 +    COPD (chronic obstructive pulmonary disease) (HCC)    Coughing up blood    GERD (gastroesophageal reflux disease)    Hypertension    Lung cancer (HCC)    Carcinoma, right upper lobe    Personal history of radiation therapy    Pre-diabetes    Shortness of breath dyspnea    Vitamin D deficiency     SURGICAL HISTORY: Past Surgical History:  Procedure Laterality Date   ABDOMINAL HYSTERECTOMY     BREAST BIOPSY Left 2014   +   BREAST BIOPSY Left 06/11/2019   stereo bx, x-clip, negative   BREAST BIOPSY Right 08/30/2023   US  RT BREAST BX W LOC DEV 1ST LESION IMG BX SPEC US  GUIDE 08/30/2023 [facility]   BREAST BIOPSY Right 08/30/2023   US  RT BREAST BX W LOC DEV EA ADD LESION IMG BX SPEC US   GUIDE 08/30/2023 [facility]   BREAST EXCISIONAL BIOPSY Left 2014   BREAST LUMPECTOMY Left 2014   BREAST SURGERY Left 2014   lumpectomy   COLONOSCOPY WITH PROPOFOL  N/A 08/18/2015   Procedure: COLONOSCOPY WITH PROPOFOL ;  Surgeon: Louanne KANDICE Muse, MD;  Location: [facility];  Service: Endoscopy;  Laterality: N/A;   COLONOSCOPY WITH PROPOFOL  N/A 10/12/2016   Procedure: COLONOSCOPY WITH PROPOFOL ;  Surgeon: Muse Louanne KANDICE, MD;  Location: [facility];  Service: Endoscopy;  Laterality: N/A;   ELECTROMAGNETIC NAVIGATION BROCHOSCOPY Right 07/28/2015   Procedure: ELECTROMAGNETIC NAVIGATION BRONCHOSCOPY;  Surgeon: Nickolas Cellar, MD;  Location: [facility];  Service: Cardiopulmonary;  Laterality: Right;   FOOT SURGERY     FRACTURE SURGERY     GANGLION CYST EXCISION     PORTACATH PLACEMENT N/A 10/02/2023   Procedure: INSERTION, TUNNELED CENTRAL VENOUS DEVICE, WITH PORT;  Surgeon: Rodolph Romano, MD;  Location: [facility];  Service: General;  Laterality: N/A;   THORACOTOMY/LOBECTOMY Right 09/14/2015   Procedure: THORACOTOMY/LOBECTOMY;  Surgeon: Louanne KANDICE Muse, MD;  Location: [facility];  Service: Thoracic;  Laterality: Right;    SOCIAL HISTORY: Social History   Socioeconomic History   Marital status: Single    Spouse name: Not on file   Number of children: Not on file   Years of education: Not on file   Highest education level: Not on file  Occupational History   Not on file  Tobacco Use   Smoking status: Former    Current packs/day: 0.00    Average packs/day: 1 pack/day for 30.0 years (30.0 ttl pk-yrs)    Types: Cigarettes    Start date: 07/29/1985    Quit date: 07/30/2015    Years since quitting: 8.2   Smokeless tobacco: Never  Vaping Use   Vaping status: Never Used  Substance and Sexual Activity   Alcohol use: No    Alcohol/week: 0.0 standard drinks of alcohol   Drug use: No   Sexual activity: Not on file  Other Topics Concern   Not on file  Social History  Narrative   Lives alone   Social Drivers of Health   Financial Resource Strain: Medium Risk (09/07/2023)   Received from [facility]   Overall Financial Resource Strain (CARDIA)    Difficulty of Paying Living Expenses: Somewhat hard  Food Insecurity: No Food Insecurity (09/12/2023)   Hunger Vital Sign    Worried About Running Out of Food in the Last Year: Never true    Ran Out of Food in the Last Year: Never true  Recent Concern: Food Insecurity - Food Insecurity Present (09/07/2023)   Received from [facility]   Hunger Vital Sign    Within the past 12 months, you worried that your food would run out before you got the money to buy more.: Sometimes true    Within the past 12 months, the food you bought just didn't last and you didn't have money to get more.: Never true  Transportation Needs: No Transportation Needs (09/12/2023)   PRAPARE - Transportation  Lack of Transportation (Medical): No    Lack of Transportation (Non-Medical): No  Physical Activity: Not on file  Stress: Not on file  Social Connections: Not on file  Intimate Partner Violence: Not At Risk (09/12/2023)   Humiliation, Afraid, Rape, and Kick questionnaire    Fear of Current or Ex-Partner: No    Emotionally Abused: No    Physically Abused: No    Sexually Abused: No    FAMILY HISTORY: Family History  Problem Relation Age of Onset   Hypertension Mother    Diabetes Mellitus II Mother    Cancer Mother        mets   Cancer Father        long cancer   Cancer Sister 53       breast   Breast cancer Sister 62   Cancer Sister 38       breast   Breast cancer Sister 73   Lung cancer Brother    Cancer Other        breast    ALLERGIES:  is allergic to accupril [quinapril hcl] and percocet [oxycodone -acetaminophen ].  MEDICATIONS:  Current Outpatient Medications  Medication Sig Dispense Refill   amLODipine  (NORVASC ) 10 MG tablet Take 10 mg by mouth daily with lunch.       Calcium Carb-Cholecalciferol (CALCIUM + VITAMIN D3 PO) Take 1 tablet by mouth in the morning and at bedtime.     cetirizine (ZYRTEC) 10 MG tablet Take 10 mg by mouth as needed for allergies.     famotidine  (PEPCID ) 20 MG tablet Take 20 mg by mouth daily as needed for heartburn or indigestion.     lidocaine -prilocaine  (EMLA ) cream Apply on the port. 30 -45 min  prior to port access. 30 g 3   losartan  (COZAAR ) 100 MG tablet Take 100 mg by mouth daily with lunch.      metFORMIN  (GLUMETZA ) 1000 MG (MOD) 24 hr tablet Take 1 tablet (1,000 mg total) by mouth daily with breakfast. 60 tablet 1   ondansetron  (ZOFRAN ) 8 MG tablet One pill every 8 hours as needed for nausea/vomitting. 40 tablet 1   Potassium Chloride  ER 20 MEQ TBCR Take 1 tablet by mouth daily.     prochlorperazine  (COMPAZINE ) 10 MG tablet Take 1 tablet (10 mg total) by mouth every 6 (six) hours as needed for nausea or vomiting. 30 tablet 0   traMADol  (ULTRAM ) 50 MG tablet Take 1 tablet (50 mg total) by mouth every 6 (six) hours as needed. 10 tablet 0   metoprolol  succinate (TOPROL -XL) 25 MG 24 hr tablet Take 25 mg by mouth daily with lunch.  (Patient not taking: Reported on 10/25/2023)     No current facility-administered medications for this visit.   Facility-Administered Medications Ordered in Other Visits  Medication Dose Route Frequency Provider Last Rate Last Admin   0.9 %  sodium chloride  infusion   Intravenous Continuous Rennie, Reyhan Moronta R, MD       dexamethasone  (DECADRON ) injection 10 mg  10 mg Intravenous Once Sharai Overbay R, MD       diphenhydrAMINE  (BENADRYL ) injection 50 mg  50 mg Intravenous Once Shy Guallpa R, MD       famotidine  (PEPCID ) IVPB 20 mg premix  20 mg Intravenous Once Deven Furia R, MD       PACLitaxel  (TAXOL ) 156 mg in sodium chloride  0.9 % 250 mL chemo infusion (</= 80mg /m2)  80 mg/m2 (Treatment Plan Recorded) Intravenous Once Katharina Jehle R, MD  PHYSICAL  EXAMINATION:   Vitals:   10/25/23 0904  BP: 127/82  Pulse: 76  Resp: 17  Temp: 98 F (36.7 C)  SpO2: 100%   Filed Weights   10/25/23 0904  Weight: 179 lb (81.2 kg)   Right breast 12 o'clock position-approximately 3 to 4 cm mass-slightly mobile.  No skin changes no nipple changes.  No underarm lymphadenopathy.  Physical Exam Vitals and nursing note reviewed.  HENT:     Head: Normocephalic and atraumatic.     Mouth/Throat:     Pharynx: Oropharynx is clear.  Eyes:     Extraocular Movements: Extraocular movements intact.     Pupils: Pupils are equal, round, and reactive to light.  Cardiovascular:     Rate and Rhythm: Normal rate and regular rhythm.  Pulmonary:     Comments: Decreased breath sounds bilaterally.  Abdominal:     Palpations: Abdomen is soft.  Musculoskeletal:        General: Normal range of motion.     Cervical back: Normal range of motion.  Skin:    General: Skin is warm.  Neurological:     General: No focal deficit present.     Mental Status: She is alert and oriented to person, place, and time.  Psychiatric:        Behavior: Behavior normal.        Judgment: Judgment normal.     LABORATORY DATA:  I have reviewed the data as listed Lab Results  Component Value Date   WBC 4.6 10/25/2023   HGB 10.6 (L) 10/25/2023   HCT 31.2 (L) 10/25/2023   MCV 90.7 10/25/2023   PLT 395 10/25/2023   Recent Labs    10/12/23 0917 10/18/23 0917 10/25/23 0850  NA 139 137 139  K 3.5 3.7 3.6  CL 107 102 107  CO2 24 28 24   GLUCOSE 182* 134* 162*  BUN 20 22 11   CREATININE 0.64 0.69 0.63  CALCIUM 9.2 10.2 9.3  GFRNONAA >60 >60 >60  PROT 7.3 7.0 6.7  ALBUMIN 3.7 3.6 3.6  AST 24 21 26   ALT 20 27 30   ALKPHOS 91 73 77  BILITOT 0.4 0.8 0.3    RADIOGRAPHIC STUDIES: I have personally reviewed the radiological images as listed and agreed with the findings in the report. NM Cardiac Muga Rest Result Date: 10/17/2023 CLINICAL DATA:  Breast cancer. Evaluate left  ventricular function pre chemotherapy. EXAM: NUCLEAR MEDICINE CARDIAC BLOOD POOL IMAGING (MUGA) TECHNIQUE: Cardiac multi-gated acquisition was performed at rest following intravenous injection of Tc-40m labeled red blood cells. RADIOPHARMACEUTICALS:  21.85 mCi Tc-55m pertechnetate in-vitro labeled red blood cells IV COMPARISON:  None Available. FINDINGS: Normal left ventricular wall motion. No akinetic or dyskinetic segments are identified. The ejection fraction is calculated at 56%. IMPRESSION: Normal left ventricular wall motion with ejection fraction of 56%. Electronically Signed   By: MYRTIS Stammer M.D.   On: 10/17/2023 14:34   DG Chest Port 1 View Result Date: 10/02/2023 CLINICAL DATA:  Port-A-Cath placement EXAM: PORTABLE CHEST 1 VIEW COMPARISON:  10/01/2015 FINDINGS: Reverse apical lordotic positioning. Patient rotated minimally right. Left sided Port-A-Cath terminates over the high right atrium. Normal heart size. No pleural effusion or pneumothorax. Mild right hemidiaphragm elevation. Pleuroparenchymal scarring in the inferior right hemithorax is similar to 2017. Left base subsegmental atelectasis. IMPRESSION: Left-sided Port-A-Cath terminating over the high right atrium, without pneumothorax. Electronically Signed   By: Rockey Kilts M.D.   On: 10/02/2023 08:56   DG C-Arm 1-60 Min-No Report  Result Date: 10/02/2023 Fluoroscopy was utilized by the requesting physician.  No radiographic interpretation.     Carcinoma of upper-outer quadrant of right breast in female, estrogen receptor positive (HCC) # RIGHT breast- INVASIVE DUCTAL CARCINOMA WITH PAPILLARY FEATURES- T2 [3.7cm] & positive IMC intramammary lymph node-  cN0-positive for LVI ; grade 2 -ER 95% PR 80% HER2/neu 1+/negative. Ki-67 50%- ONE LYMPH NODE, POSITIVE FOR METASTATIC CARCINOMA (1/1); METASTATIC FOCUS: 4 MM; SUSPICIOUS FOR EXTRANODAL EXTENSION Dr.Cintron- ONCOTYPE- 28. Discussed the role of endocrine therapy-given ER/PR positive disease  postsurgery; antihormone pill 1 a day for 10 years;  adjuvant Zometa. Also consider CDK- inhibitors/ PARP inhibitors post surgery. Given multifocality-large size of the tumor; given BRCA 1 positive- PLAN with mastectomy with contralateral prophylactic mastectomy. AUG 18th, 2025- [facility] scan-ejection fraction of 56%.# ON neo-adjuvant chemo- taxol  weekly- with carbo q 3W; and followed by  Adriamycin-Cytoxan every 3  weeks x 4 cycles  # Currently- ON neo-adjuvant chemo- taxol  weekly- with carbo q 3W; Proceed with cycle #1- day-15. Labs-CBC/chemistries were reviewed with the patient.     .  # GERD:  on PPI- stable.   # HTN:  stable.    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   # BRCA-positive- prophylactic contralateral mastectomy.  Consider bilateral prophylactic oophorectomy down the line.  # Social- FMLA/disability-    Weeklylabs;chemo- D-8- NO MD-   PS-  # DISPOSITION: # chemo today # as per IS- Dr.B   Above plan of care was discussed with patient/family in detail.  My contact information was given to the patient/family.      Cindy JONELLE Joe, MD 10/25/2023 9:36 AM

## 2023-10-25 NOTE — Progress Notes (Signed)
 Nutrition Assessment   Reason for Assessment:  Borderline DM   ASSESSMENT:  63 year old female with right breast cancer.  Past medical history of left breast cancer and lung cancer, HTN, Vit D deficiency.  Patient receiving taxol  followed by [facility]  Met with patient during infusion.  Reports good/normal appetite.  Started on metformin  today per patient.  Says that she has been drinking regular juice to try and get bowels moving.     Medications: zofran , compazine , potassium chloride , calcium carbonate, Vit D, metformin , pepcid    Labs: glucose 162   Anthropometrics:   Height: 65 inches Weight: 177 lb  179 lb on 7/15 BMI: 29   Estimated Energy Needs  Kcals: 1700-2000 Protein: 80-96 g Fluid: 1700-2000 ml   NUTRITION DIAGNOSIS: Food and nutrition related knowledge deficit related to elevated blood glucose (pre-DM) as evidenced by consult for diet education   INTERVENTION:  Discussed Plate Method for DM. Handout provided Encouraged sugar free beverages Contact information provided    MONITORING, EVALUATION, GOAL: weight trends, intake   Next Visit: Wednesday, Sept 17 during infusion  Neko Boyajian B. Dasie SOLON, CSO, LDN Registered Dietitian [phone]

## 2023-10-25 NOTE — Assessment & Plan Note (Addendum)
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 Ki-67 50%- ONE LYMPH NODE, POSITIVE FOR METASTATIC CARCINOMA (1/1); METASTATIC FOCUS: 4 MM; SUSPICIOUS FOR EXTRANODAL EXTENSION Dr.Cintron- ONCOTYPE- 28. Discussed the role of endocrine therapy-given ER/PR positive disease postsurgery; antihormone pill 1 a day for 10 years;  adjuvant Zometa. Also consider CDK- inhibitors/ PARP inhibitors post surgery. Given multifocality-large size of the tumor; given BRCA 1 positive- PLAN with mastectomy with contralateral prophylactic mastectomy. AUG 18th, 2025- [facility] scan-ejection fraction of 56%.# ON neo-adjuvant chemo- taxol  weekly- with carbo q 3W; and followed by  Adriamycin-Cytoxan every 3  weeks x 4 cycles  # Currently- ON neo-adjuvant chemo- taxol  weekly- with carbo q 3W; Proceed with cycle #1- day-15. Labs-CBC/chemistries were reviewed with the patient.     .  # GERD:  on PPI- stable.   # HTN:  stable.    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   # BRCA-positive- prophylactic contralateral mastectomy.  Consider bilateral prophylactic oophorectomy down the line.  # Social- FMLA/disability-    Weeklylabs;chemo- D-8- NO MD-   PS-  # DISPOSITION: # chemo today # as per IS- Dr.B

## 2023-10-25 NOTE — Addendum Note (Signed)
 Addended by: LAEL BROWNING A on: 10/25/2023 10:56 AM   Modules accepted: Orders

## 2023-10-25 NOTE — Patient Instructions (Signed)
 [facility] - A DEPT OF [facility]  Discharge Instructions: Thank you for choosing [facility] to provide your oncology and hematology care.  If you have a lab appointment with the Cancer Center, please go directly to the Cancer Center and check in at the registration area.  Wear comfortable clothing and clothing appropriate for easy access to any Portacath or PICC line.   We strive to give you quality time with your provider. You may need to reschedule your appointment if you arrive late (15 or more minutes).  Arriving late affects you and other patients whose appointments are after yours.  Also, if you miss three or more appointments without notifying the office, you may be dismissed from the clinic at the provider's discretion.      For prescription refill requests, have your pharmacy contact our office and allow 72 hours for refills to be completed.    Today you received the following chemotherapy and/or immunotherapy agents taxol       To help prevent nausea and vomiting after your treatment, we encourage you to take your nausea medication as directed.  BELOW ARE SYMPTOMS THAT SHOULD BE REPORTED IMMEDIATELY: *FEVER GREATER THAN 100.4 F (38 C) OR HIGHER *CHILLS OR SWEATING *NAUSEA AND VOMITING THAT IS NOT CONTROLLED WITH YOUR NAUSEA MEDICATION *UNUSUAL SHORTNESS OF BREATH *UNUSUAL BRUISING OR BLEEDING *URINARY PROBLEMS (pain or burning when urinating, or frequent urination) *BOWEL PROBLEMS (unusual diarrhea, constipation, pain near the anus) TENDERNESS IN MOUTH AND THROAT WITH OR WITHOUT PRESENCE OF ULCERS (sore throat, sores in mouth, or a toothache) UNUSUAL RASH, SWELLING OR PAIN  UNUSUAL VAGINAL DISCHARGE OR ITCHING   Items with * indicate a potential emergency and should be followed up as soon as possible or go to the Emergency Department if any problems should occur.  Please show the CHEMOTHERAPY ALERT CARD or IMMUNOTHERAPY ALERT  CARD at check-in to the Emergency Department and triage nurse.  Should you have questions after your visit or need to cancel or reschedule your appointment, please contact [facility] - A DEPT OF [facility]  [phone] and follow the prompts.  Office hours are 8:00 a.m. to 4:30 p.m. Monday - Friday. Please note that voicemails left after 4:00 p.m. may not be returned until the following business day.  We are closed weekends and major holidays. You have access to a nurse at all times for urgent questions. Please call the main number to the clinic [phone] and follow the prompts.  For any non-urgent questions, you may also contact your provider using MyChart. We now offer e-Visits for anyone 59 and older to request care online for non-urgent symptoms. For details visit [email or web address].   Also download the MyChart app! Go to the app store, search MyChart, open the app, select [place], and log in with your MyChart username and password.

## 2023-10-25 NOTE — Progress Notes (Signed)
 Patient here for oncology follow-up appointment, expresses no complaints or concerns at this time.

## 2023-10-25 NOTE — Telephone Encounter (Signed)
 [facility].  Called over here his name is Acupuncturist.  He states that Dr. Mela had sent in a metformin  ER 1000 once a day and he says that that is very costly for that.  But the 500 mg are much cheaper and he suggested to take 2- 500 mg tablets once a day.  I called over to the [facility] pharmacy and left a message that that is okay to do the 500 mg and she just take 2 once a day. Rosaline put the rx for 2 -500 mg instead for the 1000 mg was costly

## 2023-11-01 ENCOUNTER — Other Ambulatory Visit

## 2023-11-01 ENCOUNTER — Ambulatory Visit

## 2023-11-01 MED FILL — Fosaprepitant Dimeglumine For IV Infusion 150 MG (Base Eq): INTRAVENOUS | Qty: 5 | Status: AC

## 2023-11-02 ENCOUNTER — Inpatient Hospital Stay: Attending: Internal Medicine

## 2023-11-02 ENCOUNTER — Inpatient Hospital Stay

## 2023-11-02 ENCOUNTER — Encounter: Payer: Self-pay | Admitting: Internal Medicine

## 2023-11-02 ENCOUNTER — Other Ambulatory Visit: Payer: Self-pay

## 2023-11-02 ENCOUNTER — Inpatient Hospital Stay: Admitting: Internal Medicine

## 2023-11-02 VITALS — BP 103/49 | HR 76 | Temp 98.0°F | Resp 18 | Ht 65.0 in | Wt 178.7 lb

## 2023-11-02 VITALS — BP 121/69 | HR 81

## 2023-11-02 DIAGNOSIS — C773 Secondary and unspecified malignant neoplasm of axilla and upper limb lymph nodes: Secondary | ICD-10-CM | POA: Insufficient documentation

## 2023-11-02 DIAGNOSIS — I10 Essential (primary) hypertension: Secondary | ICD-10-CM | POA: Diagnosis not present

## 2023-11-02 DIAGNOSIS — R7303 Prediabetes: Secondary | ICD-10-CM | POA: Insufficient documentation

## 2023-11-02 DIAGNOSIS — G620 Drug-induced polyneuropathy: Secondary | ICD-10-CM | POA: Insufficient documentation

## 2023-11-02 DIAGNOSIS — E876 Hypokalemia: Secondary | ICD-10-CM | POA: Diagnosis not present

## 2023-11-02 DIAGNOSIS — Z79633 Long term (current) use of mitotic inhibitor: Secondary | ICD-10-CM | POA: Insufficient documentation

## 2023-11-02 DIAGNOSIS — Z1501 Genetic susceptibility to malignant neoplasm of breast: Secondary | ICD-10-CM | POA: Insufficient documentation

## 2023-11-02 DIAGNOSIS — Z79630 Long term (current) use of alkylating agent: Secondary | ICD-10-CM | POA: Diagnosis not present

## 2023-11-02 DIAGNOSIS — C50411 Malignant neoplasm of upper-outer quadrant of right female breast: Secondary | ICD-10-CM

## 2023-11-02 DIAGNOSIS — K219 Gastro-esophageal reflux disease without esophagitis: Secondary | ICD-10-CM | POA: Diagnosis not present

## 2023-11-02 DIAGNOSIS — Z5111 Encounter for antineoplastic chemotherapy: Secondary | ICD-10-CM | POA: Diagnosis present

## 2023-11-02 DIAGNOSIS — Z170 Estrogen receptor positive status [ER+]: Secondary | ICD-10-CM

## 2023-11-02 DIAGNOSIS — T451X5A Adverse effect of antineoplastic and immunosuppressive drugs, initial encounter: Secondary | ICD-10-CM | POA: Diagnosis not present

## 2023-11-02 DIAGNOSIS — Z85118 Personal history of other malignant neoplasm of bronchus and lung: Secondary | ICD-10-CM | POA: Diagnosis not present

## 2023-11-02 DIAGNOSIS — N390 Urinary tract infection, site not specified: Secondary | ICD-10-CM | POA: Diagnosis not present

## 2023-11-02 DIAGNOSIS — Z87891 Personal history of nicotine dependence: Secondary | ICD-10-CM | POA: Diagnosis not present

## 2023-11-02 DIAGNOSIS — K921 Melena: Secondary | ICD-10-CM | POA: Diagnosis not present

## 2023-11-02 LAB — CBC WITH DIFFERENTIAL (CANCER CENTER ONLY)
Abs Immature Granulocytes: 0.05 K/uL (ref 0.00–0.07)
Basophils Absolute: 0.1 K/uL (ref 0.0–0.1)
Basophils Relative: 1 %
Eosinophils Absolute: 0.1 K/uL (ref 0.0–0.5)
Eosinophils Relative: 2 %
HCT: 29.4 % — ABNORMAL LOW (ref 36.0–46.0)
Hemoglobin: 10.2 g/dL — ABNORMAL LOW (ref 12.0–15.0)
Immature Granulocytes: 1 %
Lymphocytes Relative: 33 %
Lymphs Abs: 2 K/uL (ref 0.7–4.0)
MCH: 31.1 pg (ref 26.0–34.0)
MCHC: 34.7 g/dL (ref 30.0–36.0)
MCV: 89.6 fL (ref 80.0–100.0)
Monocytes Absolute: 0.3 K/uL (ref 0.1–1.0)
Monocytes Relative: 5 %
Neutro Abs: 3.6 K/uL (ref 1.7–7.7)
Neutrophils Relative %: 58 %
Platelet Count: 345 K/uL (ref 150–400)
RBC: 3.28 MIL/uL — ABNORMAL LOW (ref 3.87–5.11)
RDW: 12.5 % (ref 11.5–15.5)
WBC Count: 6.2 K/uL (ref 4.0–10.5)
nRBC: 0 % (ref 0.0–0.2)

## 2023-11-02 LAB — MAGNESIUM: Magnesium: 1.6 mg/dL — ABNORMAL LOW (ref 1.7–2.4)

## 2023-11-02 LAB — CMP (CANCER CENTER ONLY)
ALT: 24 U/L (ref 0–44)
AST: 20 U/L (ref 15–41)
Albumin: 3.6 g/dL (ref 3.5–5.0)
Alkaline Phosphatase: 70 U/L (ref 38–126)
Anion gap: 9 (ref 5–15)
BUN: 15 mg/dL (ref 8–23)
CO2: 25 mmol/L (ref 22–32)
Calcium: 9.1 mg/dL (ref 8.9–10.3)
Chloride: 104 mmol/L (ref 98–111)
Creatinine: 0.54 mg/dL (ref 0.44–1.00)
GFR, Estimated: >60 mL/min (ref >60)
Glucose, Bld: 167 mg/dL — ABNORMAL HIGH (ref 70–99)
Potassium: 2.9 mmol/L — ABNORMAL LOW (ref 3.5–5.1)
Sodium: 138 mmol/L (ref 135–145)
Total Bilirubin: 0.4 mg/dL (ref 0.0–1.2)
Total Protein: 6.6 g/dL (ref 6.5–8.1)

## 2023-11-02 MED ORDER — SODIUM CHLORIDE 0.9 % IV SOLN
582.5000 mg | Freq: Once | INTRAVENOUS | Status: AC
  Administered 2023-11-02: 580 mg via INTRAVENOUS
  Filled 2023-11-02: qty 58

## 2023-11-02 MED ORDER — SODIUM CHLORIDE 0.9 % IV SOLN
80.0000 mg/m2 | Freq: Once | INTRAVENOUS | Status: AC
  Administered 2023-11-02: 156 mg via INTRAVENOUS
  Filled 2023-11-02: qty 26

## 2023-11-02 MED ORDER — PALONOSETRON HCL INJECTION 0.25 MG/5ML
0.2500 mg | Freq: Once | INTRAVENOUS | Status: AC
  Administered 2023-11-02: 0.25 mg via INTRAVENOUS
  Filled 2023-11-02: qty 5

## 2023-11-02 MED ORDER — FAMOTIDINE IN NACL 20-0.9 MG/50ML-% IV SOLN
20.0000 mg | Freq: Once | INTRAVENOUS | Status: AC
  Administered 2023-11-02: 20 mg via INTRAVENOUS
  Filled 2023-11-02: qty 50

## 2023-11-02 MED ORDER — DIPHENHYDRAMINE HCL 50 MG/ML IJ SOLN
50.0000 mg | Freq: Once | INTRAMUSCULAR | Status: AC
  Administered 2023-11-02: 50 mg via INTRAVENOUS
  Filled 2023-11-02: qty 1

## 2023-11-02 MED ORDER — SODIUM CHLORIDE 0.9 % IV SOLN
150.0000 mg | Freq: Once | INTRAVENOUS | Status: AC
  Administered 2023-11-02: 150 mg via INTRAVENOUS
  Filled 2023-11-02: qty 150

## 2023-11-02 MED ORDER — POTASSIUM CHLORIDE 10 MEQ/100ML IV SOLN
10.0000 meq | Freq: Once | INTRAVENOUS | Status: AC
  Administered 2023-11-02: 10 meq via INTRAVENOUS
  Filled 2023-11-02: qty 100

## 2023-11-02 MED ORDER — POTASSIUM CHLORIDE ER 20 MEQ PO TBCR: 1.0000 | EXTENDED_RELEASE_TABLET | Freq: Two times a day (BID) | ORAL | 1 refills | Status: AC

## 2023-11-02 MED ORDER — DEXAMETHASONE SODIUM PHOSPHATE 10 MG/ML IJ SOLN
10.0000 mg | Freq: Once | INTRAMUSCULAR | Status: AC
  Administered 2023-11-02: 10 mg via INTRAVENOUS
  Filled 2023-11-02: qty 1

## 2023-11-02 MED ORDER — SODIUM CHLORIDE 0.9 % IV SOLN
INTRAVENOUS | Status: DC
  Filled 2023-11-02: qty 250

## 2023-11-02 NOTE — Patient Instructions (Signed)
 recommend clairitn D over-the-counter x7 days. Also recommend Robitussin DM as needed for cough.

## 2023-11-02 NOTE — Progress Notes (Signed)
 Patient is doing okay. She states she think she may have a little head cold or either an allerfy flare up.

## 2023-11-02 NOTE — Patient Instructions (Signed)
 [facility] - A DEPT OF [facility]  Discharge Instructions: Thank you for choosing [facility] to provide your oncology and hematology care.  If you have a lab appointment with the Cancer Center, please go directly to the Cancer Center and check in at the registration area.  Wear comfortable clothing and clothing appropriate for easy access to any Portacath or PICC line.   We strive to give you quality time with your provider. You may need to reschedule your appointment if you arrive late (15 or more minutes).  Arriving late affects you and other patients whose appointments are after yours.  Also, if you miss three or more appointments without notifying the office, you may be dismissed from the clinic at the provider's discretion.      For prescription refill requests, have your pharmacy contact our office and allow 72 hours for refills to be completed.    Today you received the following chemotherapy and/or immunotherapy agents : Taxol , Carboplatin  T      To help prevent nausea and vomiting after your treatment, we encourage you to take your nausea medication as directed.  BELOW ARE SYMPTOMS THAT SHOULD BE REPORTED IMMEDIATELY: *FEVER GREATER THAN 100.4 F (38 C) OR HIGHER *CHILLS OR SWEATING *NAUSEA AND VOMITING THAT IS NOT CONTROLLED WITH YOUR NAUSEA MEDICATION *UNUSUAL SHORTNESS OF BREATH *UNUSUAL BRUISING OR BLEEDING *URINARY PROBLEMS (pain or burning when urinating, or frequent urination) *BOWEL PROBLEMS (unusual diarrhea, constipation, pain near the anus) TENDERNESS IN MOUTH AND THROAT WITH OR WITHOUT PRESENCE OF ULCERS (sore throat, sores in mouth, or a toothache) UNUSUAL RASH, SWELLING OR PAIN  UNUSUAL VAGINAL DISCHARGE OR ITCHING   Items with * indicate a potential emergency and should be followed up as soon as possible or go to the Emergency Department if any problems should occur.  Please show the CHEMOTHERAPY ALERT CARD or  IMMUNOTHERAPY ALERT CARD at check-in to the Emergency Department and triage nurse.  Should you have questions after your visit or need to cancel or reschedule your appointment, please contact [facility] - A DEPT OF [facility]  [phone] and follow the prompts.  Office hours are 8:00 a.m. to 4:30 p.m. Monday - Friday. Please note that voicemails left after 4:00 p.m. may not be returned until the following business day.  We are closed weekends and major holidays. You have access to a nurse at all times for urgent questions. Please call the main number to the clinic [phone] and follow the prompts.  For any non-urgent questions, you may also contact your provider using MyChart. We now offer e-Visits for anyone 81 and older to request care online for non-urgent symptoms. For details visit [email or web address].   Also download the MyChart app! Go to the app store, search MyChart, open the app, select , and log in with your MyChart username and password.

## 2023-11-02 NOTE — Progress Notes (Signed)
 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   # JULY-AUG 2025- RIGHT BREAST T2; intramammary lymph node positive- N-1- ER-PR POSITIVE: her 2 neg; Ki-67-50%; right breast cancer with T2N1- ER/PR + her 2 NEG breast cancer.   # AUG 14th, 2025-   # Colonoscopy [Dr.sankar-Neg June 2017]  # Smoker; BRCA-1 positive   Primary cancer of right upper lobe of lung (HCC)  10/26/2015 Initial Diagnosis   Primary cancer of right upper lobe of lung (HCC)   Carcinoma of upper-outer quadrant of right breast in female, estrogen receptor positive (HCC)  09/12/2023 Initial Diagnosis   Carcinoma of upper-outer quadrant of right breast in female, estrogen receptor positive (HCC)   09/12/2023 Cancer Staging   Staging form: Breast, AJCC 8th Edition - Clinical: Stage IIA (cT2, cN1, cM0, G2, ER+, PR+, HER2-) - Signed by Rennie Cindy SAUNDERS, MD on 09/29/2023 Histologic grading system: 3 grade system   10/12/2023 -  Chemotherapy   Patient is on Treatment Plan : BREAST Paclitaxel  q7d / AC q21d       HISTORY OF PRESENTING ILLNESS: Patient ambulating-independently. ALONE>   Connie Osborne 63 y.o.  female pleasant patient with  remote history of stage I lung cancer s/p surgery; and BRCA-1- right breast cancer with T2N1- ER/PR + her 2 NEG breast cancer on neoadjuvant chemotherapy.   Patient had diarrhea multiple loose  stools. Resolved of its own. NO Nausea vomiting.   Notes a mild cough-no shortness of breath no fevers.  Nasal congestion.SABRA Richard morning morning you are you are busy watching Facebook say hello to me  Patient denies any unusual shortness of breath or bone pain.  No tingling or numbness.    Review of Systems  Constitutional:  Positive for malaise/fatigue. Negative for chills, diaphoresis, fever and weight loss.  HENT:  Negative for nosebleeds and sore throat.   Eyes:  Negative for double vision.  Respiratory:  Negative for cough, hemoptysis, sputum production, shortness of breath and wheezing.   Cardiovascular:  Negative for chest pain, palpitations, orthopnea and leg swelling.  Gastrointestinal:  Negative for abdominal pain, blood in stool, constipation, diarrhea, heartburn, melena, nausea and vomiting.  Genitourinary:  Negative for dysuria, frequency and urgency.  Musculoskeletal:  Positive for back pain and joint pain.  Skin: Negative.  Negative for itching and rash.  Neurological:  Negative for dizziness, tingling, focal weakness, weakness and headaches.  Endo/Heme/Allergies:  Does not bruise/bleed easily.  Psychiatric/Behavioral:  Negative for depression. The patient is not nervous/anxious and does not have insomnia.     MEDICAL HISTORY:  Past Medical History:  Diagnosis Date   Allergy    Anxiety    Arthritis    Breast cancer (HCC) 2014   Left- Radiation; BRCA 1 +    COPD (chronic obstructive pulmonary disease) (HCC)    Coughing up blood    GERD (gastroesophageal reflux disease)    Hypertension    Lung cancer (HCC)    Carcinoma, right  upper lobe    Personal history of radiation therapy    Pre-diabetes    Shortness of breath dyspnea    Vitamin D deficiency     SURGICAL HISTORY: Past Surgical History:  Procedure Laterality Date   ABDOMINAL HYSTERECTOMY     BREAST BIOPSY Left 2014   +   BREAST BIOPSY Left 06/11/2019   stereo bx, x-clip, negative   BREAST BIOPSY  Right 08/30/2023   US  RT BREAST BX W LOC DEV 1ST LESION IMG BX SPEC US  GUIDE 08/30/2023 [facility]   BREAST BIOPSY Right 08/30/2023   US  RT BREAST BX W LOC DEV EA ADD LESION IMG BX SPEC US  GUIDE 08/30/2023 [facility]   BREAST EXCISIONAL BIOPSY Left 2014   BREAST LUMPECTOMY Left 2014   BREAST SURGERY Left 2014   lumpectomy   COLONOSCOPY WITH PROPOFOL  N/A 08/18/2015   Procedure: COLONOSCOPY WITH PROPOFOL ;  Surgeon: Louanne KANDICE Muse, MD;  Location: [facility];  Service: Endoscopy;  Laterality: N/A;   COLONOSCOPY WITH PROPOFOL  N/A 10/12/2016   Procedure: COLONOSCOPY WITH PROPOFOL ;  Surgeon: Muse Louanne KANDICE, MD;  Location: [facility];  Service: Endoscopy;  Laterality: N/A;   ELECTROMAGNETIC NAVIGATION BROCHOSCOPY Right 07/28/2015   Procedure: ELECTROMAGNETIC NAVIGATION BRONCHOSCOPY;  Surgeon: Nickolas Cellar, MD;  Location: [facility];  Service: Cardiopulmonary;  Laterality: Right;   FOOT SURGERY     FRACTURE SURGERY     GANGLION CYST EXCISION     PORTACATH PLACEMENT N/A 10/02/2023   Procedure: INSERTION, TUNNELED CENTRAL VENOUS DEVICE, WITH PORT;  Surgeon: Rodolph Romano, MD;  Location: [facility];  Service: General;  Laterality: N/A;   THORACOTOMY/LOBECTOMY Right 09/14/2015   Procedure: THORACOTOMY/LOBECTOMY;  Surgeon: Louanne KANDICE Muse, MD;  Location: [facility];  Service: Thoracic;  Laterality: Right;    SOCIAL HISTORY: Social History   Socioeconomic History   Marital status: Single    Spouse name: Not on file   Number of children: Not on file   Years of education: Not on file   Highest education level: Not on file  Occupational History   Not on file  Tobacco Use   Smoking status: Former    Current packs/day: 0.00    Average packs/day: 1 pack/day for 30.0 years (30.0 ttl pk-yrs)    Types: Cigarettes    Start date: 07/29/1985    Quit date: 07/30/2015    Years since quitting: 8.2   Smokeless tobacco: Never  Vaping Use   Vaping status: Never Used  Substance  and Sexual Activity   Alcohol use: No    Alcohol/week: 0.0 standard drinks of alcohol   Drug use: No   Sexual activity: Not on file  Other Topics Concern   Not on file  Social History Narrative   Lives alone   Social Drivers of Health   Financial Resource Strain: Medium Risk (09/07/2023)   Received from [facility]   Overall Financial Resource Strain (CARDIA)    Difficulty of Paying Living Expenses: Somewhat hard  Food Insecurity: No Food Insecurity (09/12/2023)   Hunger Vital Sign    Worried About Running Out of Food in the Last Year: Never true    Ran Out of Food in the Last Year: Never true  Recent Concern: Food Insecurity - Food Insecurity Present (09/07/2023)   Received from [facility]   Hunger Vital Sign    Within the past 12 months, you worried that your food would run out before you got the money to buy more.: Sometimes true  Within the past 12 months, the food you bought just didn't last and you didn't have money to get more.: Never true  Transportation Needs: No Transportation Needs (09/12/2023)   PRAPARE - Administrator, Civil Service (Medical): No    Lack of Transportation (Non-Medical): No  Physical Activity: Not on file  Stress: Not on file  Social Connections: Not on file  Intimate Partner Violence: Not At Risk (09/12/2023)   Humiliation, Afraid, Rape, and Kick questionnaire    Fear of Current or Ex-Partner: No    Emotionally Abused: No    Physically Abused: No    Sexually Abused: No    FAMILY HISTORY: Family History  Problem Relation Age of Onset   Hypertension Mother    Diabetes Mellitus II Mother    Cancer Mother        mets   Cancer Father        long cancer   Cancer Sister 58       breast   Breast cancer Sister 39   Cancer Sister 31       breast   Breast cancer Sister 7   Lung cancer Brother    Cancer Other        breast    ALLERGIES:  is allergic to accupril [quinapril hcl] and percocet  [oxycodone -acetaminophen ].  MEDICATIONS:  Current Outpatient Medications  Medication Sig Dispense Refill   amLODipine  (NORVASC ) 10 MG tablet Take 10 mg by mouth daily with lunch.      Calcium Carb-Cholecalciferol (CALCIUM + VITAMIN D3 PO) Take 1 tablet by mouth in the morning and at bedtime.     cetirizine (ZYRTEC) 10 MG tablet Take 10 mg by mouth as needed for allergies.     famotidine  (PEPCID ) 20 MG tablet Take 20 mg by mouth daily as needed for heartburn or indigestion.     lidocaine -prilocaine  (EMLA ) cream Apply on the port. 30 -45 min  prior to port access. 30 g 3   losartan  (COZAAR ) 100 MG tablet Take 100 mg by mouth daily with lunch.      metFORMIN  (GLUCOPHAGE -XR) 500 MG 24 hr tablet Take 2 tablets (1,000 mg total) by mouth daily with breakfast. 60 tablet 3   metoprolol  succinate (TOPROL -XL) 25 MG 24 hr tablet Take 25 mg by mouth daily with lunch.      ondansetron  (ZOFRAN ) 8 MG tablet One pill every 8 hours as needed for nausea/vomitting. 40 tablet 1   prochlorperazine  (COMPAZINE ) 10 MG tablet Take 1 tablet (10 mg total) by mouth every 6 (six) hours as needed for nausea or vomiting. 30 tablet 0   traMADol  (ULTRAM ) 50 MG tablet Take 1 tablet (50 mg total) by mouth every 6 (six) hours as needed. 10 tablet 0   Potassium Chloride  ER 20 MEQ TBCR Take 1 tablet (20 mEq total) by mouth 2 (two) times daily. 120 tablet 1   No current facility-administered medications for this visit.   Facility-Administered Medications Ordered in Other Visits  Medication Dose Route Frequency Provider Last Rate Last Admin   0.9 %  sodium chloride  infusion   Intravenous Continuous Jermaine Neuharth R, MD 10 mL/hr at 11/02/23 0933 New Bag at 11/02/23 0933   CARBOplatin  (PARAPLATIN ) 580 mg in sodium chloride  0.9 % 250 mL chemo infusion  580 mg Intravenous Once Malene Blaydes R, MD       famotidine  (PEPCID ) IVPB 20 mg premix  20 mg Intravenous Once Kisean Rollo R, MD       fosaprepitant  (  EMEND) 150 mg  in sodium chloride  0.9 % 145 mL IVPB  150 mg Intravenous Once Kalei Mckillop R, MD       PACLitaxel  (TAXOL ) 156 mg in sodium chloride  0.9 % 250 mL chemo infusion (</= 80mg /m2)  80 mg/m2 (Treatment Plan Recorded) Intravenous Once Manaia Samad R, MD       potassium chloride  10 mEq in 100 mL IVPB  10 mEq Intravenous Once Arlan Birks R, MD        PHYSICAL EXAMINATION:   Vitals:   11/02/23 0838  BP: (!) 103/49  Pulse: 76  Resp: 18  Temp: 98 F (36.7 C)  SpO2: 100%   Filed Weights   11/02/23 0838  Weight: 178 lb 11.2 oz (81.1 kg)   I have all things marked out  Physical Exam Vitals and nursing note reviewed.  HENT:     Head: Normocephalic and atraumatic.     Mouth/Throat:     Pharynx: Oropharynx is clear.  Eyes:     Extraocular Movements: Extraocular movements intact.     Pupils: Pupils are equal, round, and reactive to light.  Cardiovascular:     Rate and Rhythm: Normal rate and regular rhythm.  Pulmonary:     Comments: Decreased breath sounds bilaterally.  Abdominal:     Palpations: Abdomen is soft.  Musculoskeletal:        General: Normal range of motion.     Cervical back: Normal range of motion.  Skin:    General: Skin is warm.  Neurological:     General: No focal deficit present.     Mental Status: She is alert and oriented to person, place, and time.  Psychiatric:        Behavior: Behavior normal.        Judgment: Judgment normal.     LABORATORY DATA:  I have reviewed the data as listed Lab Results  Component Value Date   WBC 6.2 11/02/2023   HGB 10.2 (L) 11/02/2023   HCT 29.4 (L) 11/02/2023   MCV 89.6 11/02/2023   PLT 345 11/02/2023   Recent Labs    10/18/23 0917 10/25/23 0850 11/02/23 0813  NA 137 139 138  K 3.7 3.6 2.9*  CL 102 107 104  CO2 28 24 25   GLUCOSE 134* 162* 167*  BUN 22 11 15   CREATININE 0.69 0.63 0.54  CALCIUM 10.2 9.3 9.1  GFRNONAA >60 >60 >60  PROT 7.0 6.7 6.6  ALBUMIN 3.6 3.6 3.6  AST 21 26 20   ALT  27 30 24   ALKPHOS 73 77 70  BILITOT 0.8 0.3 0.4    RADIOGRAPHIC STUDIES: I have personally reviewed the radiological images as listed and agreed with the findings in the report. NM Cardiac Muga Rest Result Date: 10/17/2023 CLINICAL DATA:  Breast cancer. Evaluate left ventricular function pre chemotherapy. EXAM: NUCLEAR MEDICINE CARDIAC BLOOD POOL IMAGING (MUGA) TECHNIQUE: Cardiac multi-gated acquisition was performed at rest following intravenous injection of Tc-74m labeled red blood cells. RADIOPHARMACEUTICALS:  21.85 mCi Tc-72m pertechnetate in-vitro labeled red blood cells IV COMPARISON:  None Available. FINDINGS: Normal left ventricular wall motion. No akinetic or dyskinetic segments are identified. The ejection fraction is calculated at 56%. IMPRESSION: Normal left ventricular wall motion with ejection fraction of 56%. Electronically Signed   By: MYRTIS Stammer M.D.   On: 10/17/2023 14:34     Carcinoma of upper-outer quadrant of right breast in female, estrogen receptor positive (HCC) # RIGHT breast- INVASIVE DUCTAL CARCINOMA WITH PAPILLARY FEATURES- T2 [3.7cm] & positive  [facility] intramammary lymph node-  cN0-positive for LVI ; grade 2 -ER 95% PR 80% HER2/neu 1+/negative. Ki-67 50%- ONE LYMPH NODE, POSITIVE FOR METASTATIC CARCINOMA (1/1); METASTATIC FOCUS: 4 MM; SUSPICIOUS FOR EXTRANODAL EXTENSION Dr.Cintron- ONCOTYPE- 28. Discussed the role of endocrine therapy-given ER/PR positive disease postsurgery; antihormone pill 1 a day for 10 years;  adjuvant Zometa. Also consider CDK- inhibitors/ PARP inhibitors post surgery. Given multifocality-large size of the tumor; given BRCA 1 positive- PLAN with mastectomy with contralateral prophylactic mastectomy. AUG 18th, 2025- [facility] scan-ejection fraction of 56%.# ON neo-adjuvant chemo- taxol  weekly- with carbo q 3W; and followed by  Adriamycin-Cytoxan every 3  weeks x 4 cycles  # Currently- ON neo-adjuvant chemo- taxol  weekly- with carbo q 3W; Proceed with cycle  #2- day-1 Labs-CBC/chemistries were reviewed with the patient- ok except hypokalemia.   # hypokalemia-likely from diarrhea.-Recommend K-Dur twice a day and also add 10 of KCl with the treatment  # Cough-  URI/seasonal allergies- no concerns for Lower respiratory tract infection. HOLD CXR. Recommend claritin  D; and recommend Robitussin- DM over the counter.   # diarrhea -G-1-2. Likely from chemo. UNLIKELY metformin . Imodium prn.      .  # GERD:  on PPI- stable.   # HTN:  stable.    # Hx of Borderline DM-  s/p nutrition-  continue metformin  1000 mg ER/day.   # BRCA-positive- prophylactic contralateral mastectomy.  Consider bilateral prophylactic oophorectomy down the line.  # Social- FMLA/disability-    Weeklylabs;chemo- D-8- NO MD-   PS-  # DISPOSITION: # add mag to labs today;  # chemo today; add 10 kcl today # add 10 kcl to weekly chemo appts.  # as per IS- Dr.B      Cindy JONELLE Joe, MD 11/02/2023 9:45 AM

## 2023-11-02 NOTE — Assessment & Plan Note (Addendum)
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 Ki-67 50%- ONE LYMPH NODE, POSITIVE FOR METASTATIC CARCINOMA (1/1); METASTATIC FOCUS: 4 MM; SUSPICIOUS FOR EXTRANODAL EXTENSION Dr.Cintron- ONCOTYPE- 28. Discussed the role of endocrine therapy-given ER/PR positive disease postsurgery; antihormone pill 1 a day for 10 years;  adjuvant Zometa. Also consider CDK- inhibitors/ PARP inhibitors post surgery. Given multifocality-large size of the tumor; given BRCA 1 positive- PLAN with mastectomy with contralateral prophylactic mastectomy. AUG 18th, 2025- [facility] scan-ejection fraction of 56%.# ON neo-adjuvant chemo- taxol  weekly- with carbo q 3W; and followed by  Adriamycin-Cytoxan every 3  weeks x 4 cycles  # Currently- ON neo-adjuvant chemo- taxol  weekly- with carbo q 3W; Proceed with cycle #2- day-1 Labs-CBC/chemistries were reviewed with the patient- ok except hypokalemia.   # hypokalemia-likely from diarrhea.-Recommend K-Dur twice a day and also add 10 of KCl with the treatment  # Cough-  URI/seasonal allergies- no concerns for Lower respiratory tract infection. HOLD CXR. Recommend claritin  D; and recommend Robitussin- DM over the counter.   # diarrhea -G-1-2. Likely from chemo. UNLIKELY metformin . Imodium prn.      .  # GERD:  on PPI- stable.   # HTN:  stable.    # Hx of Borderline DM-  s/p nutrition-  continue metformin  1000 mg ER/day.   # BRCA-positive- prophylactic contralateral mastectomy.  Consider bilateral prophylactic oophorectomy down the line.  # Social- FMLA/disability-    Weeklylabs;chemo- D-8- NO MD-   PS-  # DISPOSITION: # add mag to labs today;  # chemo today; add 10 kcl today # add 10 kcl to weekly chemo appts.  # as per IS- Dr.B

## 2023-11-06 ENCOUNTER — Encounter: Payer: Self-pay | Admitting: Internal Medicine

## 2023-11-08 ENCOUNTER — Inpatient Hospital Stay

## 2023-11-08 VITALS — BP 129/73 | HR 89 | Temp 97.3°F | Resp 18 | Wt 177.5 lb

## 2023-11-08 DIAGNOSIS — Z5111 Encounter for antineoplastic chemotherapy: Secondary | ICD-10-CM | POA: Diagnosis not present

## 2023-11-08 DIAGNOSIS — C50411 Malignant neoplasm of upper-outer quadrant of right female breast: Secondary | ICD-10-CM

## 2023-11-08 LAB — CBC WITH DIFFERENTIAL (CANCER CENTER ONLY)
Abs Immature Granulocytes: 0.02 K/uL (ref 0.00–0.07)
Basophils Absolute: 0 K/uL (ref 0.0–0.1)
Basophils Relative: 1 %
Eosinophils Absolute: 0.1 K/uL (ref 0.0–0.5)
Eosinophils Relative: 2 %
HCT: 29.6 % — ABNORMAL LOW (ref 36.0–46.0)
Hemoglobin: 10 g/dL — ABNORMAL LOW (ref 12.0–15.0)
Immature Granulocytes: 1 %
Lymphocytes Relative: 40 %
Lymphs Abs: 1.7 K/uL (ref 0.7–4.0)
MCH: 30.4 pg (ref 26.0–34.0)
MCHC: 33.8 g/dL (ref 30.0–36.0)
MCV: 90 fL (ref 80.0–100.0)
Monocytes Absolute: 0.1 K/uL (ref 0.1–1.0)
Monocytes Relative: 3 %
Neutro Abs: 2.3 K/uL (ref 1.7–7.7)
Neutrophils Relative %: 53 %
Platelet Count: 290 K/uL (ref 150–400)
RBC: 3.29 MIL/uL — ABNORMAL LOW (ref 3.87–5.11)
RDW: 13 % (ref 11.5–15.5)
WBC Count: 4.4 K/uL (ref 4.0–10.5)
nRBC: 0 % (ref 0.0–0.2)

## 2023-11-08 LAB — CMP (CANCER CENTER ONLY)
ALT: 23 U/L (ref 0–44)
AST: 22 U/L (ref 15–41)
Albumin: 3.6 g/dL (ref 3.5–5.0)
Alkaline Phosphatase: 66 U/L (ref 38–126)
Anion gap: 9 (ref 5–15)
BUN: 17 mg/dL (ref 8–23)
CO2: 25 mmol/L (ref 22–32)
Calcium: 9.5 mg/dL (ref 8.9–10.3)
Chloride: 103 mmol/L (ref 98–111)
Creatinine: 0.62 mg/dL (ref 0.44–1.00)
GFR, Estimated: >60 mL/min (ref >60)
Glucose, Bld: 126 mg/dL — ABNORMAL HIGH (ref 70–99)
Potassium: 3.6 mmol/L (ref 3.5–5.1)
Sodium: 137 mmol/L (ref 135–145)
Total Bilirubin: 0.4 mg/dL (ref 0.0–1.2)
Total Protein: 6.4 g/dL — ABNORMAL LOW (ref 6.5–8.1)

## 2023-11-08 MED ORDER — DEXAMETHASONE SODIUM PHOSPHATE 10 MG/ML IJ SOLN
10.0000 mg | Freq: Once | INTRAMUSCULAR | Status: AC
  Administered 2023-11-08: 10 mg via INTRAVENOUS
  Filled 2023-11-08: qty 1

## 2023-11-08 MED ORDER — SODIUM CHLORIDE 0.9 % IV SOLN
INTRAVENOUS | Status: DC
  Filled 2023-11-08: qty 250

## 2023-11-08 MED ORDER — SODIUM CHLORIDE 0.9 % IV SOLN
80.0000 mg/m2 | Freq: Once | INTRAVENOUS | Status: AC
  Administered 2023-11-08: 156 mg via INTRAVENOUS
  Filled 2023-11-08: qty 26

## 2023-11-08 MED ORDER — FAMOTIDINE IN NACL 20-0.9 MG/50ML-% IV SOLN
20.0000 mg | Freq: Once | INTRAVENOUS | Status: AC
  Administered 2023-11-08: 20 mg via INTRAVENOUS
  Filled 2023-11-08: qty 50

## 2023-11-08 MED ORDER — DIPHENHYDRAMINE HCL 50 MG/ML IJ SOLN
50.0000 mg | Freq: Once | INTRAMUSCULAR | Status: AC
  Administered 2023-11-08: 50 mg via INTRAVENOUS
  Filled 2023-11-08: qty 1

## 2023-11-08 NOTE — Patient Instructions (Signed)

## 2023-11-15 ENCOUNTER — Inpatient Hospital Stay

## 2023-11-15 ENCOUNTER — Inpatient Hospital Stay (HOSPITAL_BASED_OUTPATIENT_CLINIC_OR_DEPARTMENT_OTHER): Admitting: Internal Medicine

## 2023-11-15 ENCOUNTER — Encounter: Payer: Self-pay | Admitting: Internal Medicine

## 2023-11-15 VITALS — BP 124/86 | HR 81 | Temp 98.4°F | Resp 18 | Ht 65.0 in | Wt 175.8 lb

## 2023-11-15 VITALS — BP 128/79 | HR 80

## 2023-11-15 DIAGNOSIS — C50411 Malignant neoplasm of upper-outer quadrant of right female breast: Secondary | ICD-10-CM

## 2023-11-15 DIAGNOSIS — Z170 Estrogen receptor positive status [ER+]: Secondary | ICD-10-CM

## 2023-11-15 DIAGNOSIS — Z5111 Encounter for antineoplastic chemotherapy: Secondary | ICD-10-CM | POA: Diagnosis not present

## 2023-11-15 LAB — CMP (CANCER CENTER ONLY)
ALT: 21 U/L (ref 0–44)
AST: 20 U/L (ref 15–41)
Albumin: 3.7 g/dL (ref 3.5–5.0)
Alkaline Phosphatase: 66 U/L (ref 38–126)
Anion gap: 8 (ref 5–15)
BUN: 12 mg/dL (ref 8–23)
CO2: 24 mmol/L (ref 22–32)
Calcium: 9.2 mg/dL (ref 8.9–10.3)
Chloride: 108 mmol/L (ref 98–111)
Creatinine: 0.5 mg/dL (ref 0.44–1.00)
GFR, Estimated: >60 mL/min (ref >60)
Glucose, Bld: 126 mg/dL — ABNORMAL HIGH (ref 70–99)
Potassium: 3.5 mmol/L (ref 3.5–5.1)
Sodium: 140 mmol/L (ref 135–145)
Total Bilirubin: 0.6 mg/dL (ref 0.0–1.2)
Total Protein: 6.5 g/dL (ref 6.5–8.1)

## 2023-11-15 LAB — CBC WITH DIFFERENTIAL (CANCER CENTER ONLY)
Abs Immature Granulocytes: 0.06 K/uL (ref 0.00–0.07)
Basophils Absolute: 0.1 K/uL (ref 0.0–0.1)
Basophils Relative: 1 %
Eosinophils Absolute: 0.1 K/uL (ref 0.0–0.5)
Eosinophils Relative: 1 %
HCT: 28 % — ABNORMAL LOW (ref 36.0–46.0)
Hemoglobin: 9.5 g/dL — ABNORMAL LOW (ref 12.0–15.0)
Immature Granulocytes: 1 %
Lymphocytes Relative: 34 %
Lymphs Abs: 2.1 K/uL (ref 0.7–4.0)
MCH: 30.7 pg (ref 26.0–34.0)
MCHC: 33.9 g/dL (ref 30.0–36.0)
MCV: 90.6 fL (ref 80.0–100.0)
Monocytes Absolute: 0.4 K/uL (ref 0.1–1.0)
Monocytes Relative: 7 %
Neutro Abs: 3.4 K/uL (ref 1.7–7.7)
Neutrophils Relative %: 56 %
Platelet Count: 319 K/uL (ref 150–400)
RBC: 3.09 MIL/uL — ABNORMAL LOW (ref 3.87–5.11)
RDW: 13.6 % (ref 11.5–15.5)
WBC Count: 6.1 K/uL (ref 4.0–10.5)
nRBC: 0.5 % — ABNORMAL HIGH (ref 0.0–0.2)

## 2023-11-15 MED ORDER — GLIPIZIDE ER 5 MG PO TB24: 5.0000 mg | ORAL_TABLET | Freq: Every day | ORAL | 6 refills | Status: AC

## 2023-11-15 MED ORDER — DIPHENHYDRAMINE HCL 50 MG/ML IJ SOLN
50.0000 mg | Freq: Once | INTRAMUSCULAR | Status: AC
  Administered 2023-11-15: 50 mg via INTRAVENOUS
  Filled 2023-11-15: qty 1

## 2023-11-15 MED ORDER — SODIUM CHLORIDE 0.9 % IV SOLN
80.0000 mg/m2 | Freq: Once | INTRAVENOUS | Status: AC
  Administered 2023-11-15: 156 mg via INTRAVENOUS
  Filled 2023-11-15: qty 26

## 2023-11-15 MED ORDER — DIPHENOXYLATE-ATROPINE 2.5-0.025 MG PO TABS: 1.0000 | ORAL_TABLET | Freq: Four times a day (QID) | ORAL | 0 refills | Status: AC | PRN

## 2023-11-15 MED ORDER — DEXAMETHASONE SODIUM PHOSPHATE 10 MG/ML IJ SOLN
10.0000 mg | Freq: Once | INTRAMUSCULAR | Status: AC
  Administered 2023-11-15: 10 mg via INTRAVENOUS
  Filled 2023-11-15: qty 1

## 2023-11-15 MED ORDER — SODIUM CHLORIDE 0.9 % IV SOLN
INTRAVENOUS | Status: DC
  Filled 2023-11-15: qty 250

## 2023-11-15 MED ORDER — FAMOTIDINE IN NACL 20-0.9 MG/50ML-% IV SOLN
20.0000 mg | Freq: Once | INTRAVENOUS | Status: AC
  Administered 2023-11-15: 20 mg via INTRAVENOUS
  Filled 2023-11-15: qty 50

## 2023-11-15 NOTE — Progress Notes (Signed)
 Nutrition  Planning to see patient today during infusion but infusion cancelled for today.  Will reschedule.   Aleck Locklin B. Dasie SOLON, CSO, LDN Registered Dietitian [phone]

## 2023-11-15 NOTE — Progress Notes (Signed)
 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   # JULY-AUG 2025- RIGHT BREAST T2; intramammary lymph node positive- N-1- ER-PR POSITIVE: her 2 neg; Ki-67-50%; right breast cancer with T2N1- ER/PR + her 2 NEG breast cancer.   # AUG 14th, 2025-   # Colonoscopy [Dr.sankar-Neg June 2017]  # Smoker; BRCA-1 positive   Primary cancer of right upper lobe of lung (HCC)  10/26/2015 Initial Diagnosis   Primary cancer of right upper lobe of lung (HCC)   Carcinoma of upper-outer quadrant of right breast in female, estrogen receptor positive (HCC)  09/12/2023 Initial Diagnosis   Carcinoma of upper-outer quadrant of right breast in female, estrogen receptor positive (HCC)   09/12/2023 Cancer Staging   Staging form: Breast, AJCC 8th Edition - Clinical: Stage IIA (cT2, cN1, cM0, G2, ER+, PR+, HER2-) - Signed by Rennie Cindy SAUNDERS, MD on 09/29/2023 Histologic grading system: 3 grade system   10/12/2023 -  Chemotherapy   Patient is on Treatment Plan : BREAST Paclitaxel  q7d / AC q21d       HISTORY OF PRESENTING ILLNESS: Patient ambulating-independently. ALONE>   Connie Osborne 63 y.o.  female pleasant patient with  remote history of stage I lung cancer s/p surgery; and BRCA-1- right breast cancer with T2N1- ER/PR + her 2 NEG breast cancer on neoadjuvant chemotherapy.   C/o diarrhea 3-4 times a day. Taking  lomotil . Thinking about stopping metformin  to see if that helps. NO Nausea vomiting.    Patient noted to have decreased size of the breast mass.   C/o nose bleeding for a week, just a little when she blows her nose.  Otherwise no gum bleeding no rectal bleeding.   Complains of mild tingling and numbness in extremities.   Review of Systems  Constitutional:  Positive for malaise/fatigue. Negative for chills, diaphoresis, fever and weight loss.  HENT:  Negative for nosebleeds and sore throat.   Eyes:  Negative for double vision.  Respiratory:  Negative for cough, hemoptysis, sputum production, shortness of breath and wheezing.   Cardiovascular:  Negative for chest pain, palpitations, orthopnea and leg swelling.  Gastrointestinal:  Negative for abdominal pain, blood in stool, constipation, diarrhea, heartburn, melena, nausea and vomiting.  Genitourinary:  Negative for dysuria, frequency and urgency.  Musculoskeletal:  Positive for back pain and joint pain.  Skin: Negative.  Negative for itching and rash.  Neurological:  Negative for dizziness, tingling, focal weakness, weakness and headaches.  Endo/Heme/Allergies:  Does not bruise/bleed easily.  Psychiatric/Behavioral:  Negative for depression. The patient is not nervous/anxious and does not have insomnia.     MEDICAL HISTORY:  Past Medical History:  Diagnosis Date   Allergy    Anxiety    Arthritis    Breast cancer (HCC) 2014   Left- Radiation; BRCA 1 +    COPD (chronic obstructive pulmonary disease) (HCC)    Coughing up blood    GERD (gastroesophageal reflux disease)    Hypertension  Lung cancer (HCC)    Carcinoma, right upper lobe    Personal history of radiation therapy    Pre-diabetes    Shortness of breath dyspnea    Vitamin D deficiency     SURGICAL HISTORY: Past Surgical History:  Procedure Laterality Date   ABDOMINAL HYSTERECTOMY     BREAST BIOPSY Left 2014   +   BREAST BIOPSY Left 06/11/2019   stereo bx,  x-clip, negative   BREAST BIOPSY Right 08/30/2023   US  RT BREAST BX W LOC DEV 1ST LESION IMG BX SPEC US  GUIDE 08/30/2023 [facility]   BREAST BIOPSY Right 08/30/2023   US  RT BREAST BX W LOC DEV EA ADD LESION IMG BX SPEC US  GUIDE 08/30/2023 [facility]   BREAST EXCISIONAL BIOPSY Left 2014   BREAST LUMPECTOMY Left 2014   BREAST SURGERY Left 2014   lumpectomy   COLONOSCOPY WITH PROPOFOL  N/A 08/18/2015   Procedure: COLONOSCOPY WITH PROPOFOL ;  Surgeon: Louanne KANDICE Muse, MD;  Location: [facility];  Service: Endoscopy;  Laterality: N/A;   COLONOSCOPY WITH PROPOFOL  N/A 10/12/2016   Procedure: COLONOSCOPY WITH PROPOFOL ;  Surgeon: Muse Louanne KANDICE, MD;  Location: [facility];  Service: Endoscopy;  Laterality: N/A;   ELECTROMAGNETIC NAVIGATION BROCHOSCOPY Right 07/28/2015   Procedure: ELECTROMAGNETIC NAVIGATION BRONCHOSCOPY;  Surgeon: Nickolas Cellar, MD;  Location: [facility];  Service: Cardiopulmonary;  Laterality: Right;   FOOT SURGERY     FRACTURE SURGERY     GANGLION CYST EXCISION     PORTACATH PLACEMENT N/A 10/02/2023   Procedure: INSERTION, TUNNELED CENTRAL VENOUS DEVICE, WITH PORT;  Surgeon: Rodolph Romano, MD;  Location: [facility];  Service: General;  Laterality: N/A;   THORACOTOMY/LOBECTOMY Right 09/14/2015   Procedure: THORACOTOMY/LOBECTOMY;  Surgeon: Louanne KANDICE Muse, MD;  Location: [facility];  Service: Thoracic;  Laterality: Right;    SOCIAL HISTORY: Social History   Socioeconomic History   Marital status: Single    Spouse name: Not on file   Number of children: Not on file   Years of education: Not on file   Highest education level: Not on file  Occupational History   Not on file  Tobacco Use   Smoking status: Former    Current packs/day: 0.00    Average packs/day: 1 pack/day for 30.0 years (30.0 ttl pk-yrs)    Types: Cigarettes    Start date: 07/29/1985    Quit date: 07/30/2015    Years since quitting: 8.3   Smokeless tobacco: Never  Vaping Use    Vaping status: Never Used  Substance and Sexual Activity   Alcohol use: No    Alcohol/week: 0.0 standard drinks of alcohol   Drug use: No   Sexual activity: Not on file  Other Topics Concern   Not on file  Social History Narrative   Lives alone   Social Drivers of Health   Financial Resource Strain: Medium Risk (09/07/2023)   Received from [facility]   Overall Financial Resource Strain (CARDIA)    Difficulty of Paying Living Expenses: Somewhat hard  Food Insecurity: No Food Insecurity (09/12/2023)   Hunger Vital Sign    Worried About Running Out of Food in the Last Year: Never true    Ran Out of Food in the Last Year: Never true  Recent Concern: Food Insecurity - Food Insecurity Present (09/07/2023)   Received from [facility]   Hunger Vital Sign    Within the past 12 months, you worried that your food would run out before you got the  money to buy more.: Sometimes true    Within the past 12 months, the food you bought just didn't last and you didn't have money to get more.: Never true  Transportation Needs: No Transportation Needs (09/12/2023)   PRAPARE - Administrator, Civil Service (Medical): No    Lack of Transportation (Non-Medical): No  Physical Activity: Not on file  Stress: Not on file  Social Connections: Not on file  Intimate Partner Violence: Not At Risk (09/12/2023)   Humiliation, Afraid, Rape, and Kick questionnaire    Fear of Current or Ex-Partner: No    Emotionally Abused: No    Physically Abused: No    Sexually Abused: No    FAMILY HISTORY: Family History  Problem Relation Age of Onset   Hypertension Mother    Diabetes Mellitus II Mother    Cancer Mother        mets   Cancer Father        long cancer   Cancer Sister 94       breast   Breast cancer Sister 3   Cancer Sister 82       breast   Breast cancer Sister 55   Lung cancer Brother    Cancer Other        breast    ALLERGIES:  is allergic to  accupril [quinapril hcl] and percocet [oxycodone -acetaminophen ].  MEDICATIONS:  Current Outpatient Medications  Medication Sig Dispense Refill   amLODipine  (NORVASC ) 10 MG tablet Take 10 mg by mouth daily with lunch.      Calcium Carb-Cholecalciferol (CALCIUM + VITAMIN D3 PO) Take 1 tablet by mouth in the morning and at bedtime.     cetirizine (ZYRTEC) 10 MG tablet Take 10 mg by mouth as needed for allergies.     diphenoxylate -atropine  (LOMOTIL ) 2.5-0.025 MG tablet Take 1 tablet by mouth 4 (four) times daily as needed for diarrhea or loose stools. Take it along with immodium 60 tablet 0   famotidine  (PEPCID ) 20 MG tablet Take 20 mg by mouth daily as needed for heartburn or indigestion.     glipiZIDE  (GLUCOTROL  XL) 5 MG 24 hr tablet Take 1 tablet (5 mg total) by mouth daily with breakfast. 30 tablet 6   lidocaine -prilocaine  (EMLA ) cream Apply on the port. 30 -45 min  prior to port access. 30 g 3   losartan  (COZAAR ) 100 MG tablet Take 100 mg by mouth daily with lunch.      metoprolol  succinate (TOPROL -XL) 25 MG 24 hr tablet Take 25 mg by mouth daily with lunch.      ondansetron  (ZOFRAN ) 8 MG tablet One pill every 8 hours as needed for nausea/vomitting. 40 tablet 1   Potassium Chloride  ER 20 MEQ TBCR Take 1 tablet (20 mEq total) by mouth 2 (two) times daily. 120 tablet 1   prochlorperazine  (COMPAZINE ) 10 MG tablet Take 1 tablet (10 mg total) by mouth every 6 (six) hours as needed for nausea or vomiting. 30 tablet 0   traMADol  (ULTRAM ) 50 MG tablet Take 1 tablet (50 mg total) by mouth every 6 (six) hours as needed. 10 tablet 0   No current facility-administered medications for this visit.   Facility-Administered Medications Ordered in Other Visits  Medication Dose Route Frequency Provider Last Rate Last Admin   0.9 %  sodium chloride  infusion   Intravenous Continuous Jaymi Tinner R, MD 10 mL/hr at 11/15/23 0909 New Bag at 11/15/23 0909   diphenhydrAMINE  (BENADRYL ) injection 50 mg  50 mg  Intravenous Once Shoichi Mielke R, MD       famotidine  (PEPCID ) IVPB 20 mg premix  20 mg Intravenous Once Jacson Rapaport R, MD       PACLitaxel  (TAXOL ) 156 mg in sodium chloride  0.9 % 250 mL chemo infusion (</= 80mg /m2)  80 mg/m2 (Treatment Plan Recorded) Intravenous Once Marilena Trevathan R, MD        PHYSICAL EXAMINATION:   Vitals:   11/15/23 0818  BP: 124/86  Pulse: 81  Resp: 18  Temp: 98.4 F (36.9 C)  SpO2: 100%   Filed Weights   11/15/23 0818  Weight: 175 lb 12.8 oz (79.7 kg)   I have all things marked out  Physical Exam Vitals and nursing note reviewed.  HENT:     Head: Normocephalic and atraumatic.     Mouth/Throat:     Pharynx: Oropharynx is clear.  Eyes:     Extraocular Movements: Extraocular movements intact.     Pupils: Pupils are equal, round, and reactive to light.  Cardiovascular:     Rate and Rhythm: Normal rate and regular rhythm.  Pulmonary:     Comments: Decreased breath sounds bilaterally.  Abdominal:     Palpations: Abdomen is soft.  Musculoskeletal:        General: Normal range of motion.     Cervical back: Normal range of motion.  Skin:    General: Skin is warm.  Neurological:     General: No focal deficit present.     Mental Status: She is alert and oriented to person, place, and time.  Psychiatric:        Behavior: Behavior normal.        Judgment: Judgment normal.     LABORATORY DATA:  I have reviewed the data as listed Lab Results  Component Value Date   WBC 6.1 11/15/2023   HGB 9.5 (L) 11/15/2023   HCT 28.0 (L) 11/15/2023   MCV 90.6 11/15/2023   PLT 319 11/15/2023   Recent Labs    11/02/23 0813 11/08/23 1125 11/15/23 0758  NA 138 137 140  K 2.9* 3.6 3.5  CL 104 103 108  CO2 25 25 24   GLUCOSE 167* 126* 126*  BUN 15 17 12   CREATININE 0.54 0.62 0.50  CALCIUM 9.1 9.5 9.2  GFRNONAA >60 >60 >60  PROT 6.6 6.4* 6.5  ALBUMIN 3.6 3.6 3.7  AST 20 22 20   ALT 24 23 21   ALKPHOS 70 66 66  BILITOT 0.4 0.4 0.6     RADIOGRAPHIC STUDIES: I have personally reviewed the radiological images as listed and agreed with the findings in the report. NM Cardiac Muga Rest Result Date: 10/17/2023 CLINICAL DATA:  Breast cancer. Evaluate left ventricular function pre chemotherapy. EXAM: NUCLEAR MEDICINE CARDIAC BLOOD POOL IMAGING (MUGA) TECHNIQUE: Cardiac multi-gated acquisition was performed at rest following intravenous injection of Tc-46m labeled red blood cells. RADIOPHARMACEUTICALS:  21.85 mCi Tc-52m pertechnetate in-vitro labeled red blood cells IV COMPARISON:  None Available. FINDINGS: Normal left ventricular wall motion. No akinetic or dyskinetic segments are identified. The ejection fraction is calculated at 56%. IMPRESSION: Normal left ventricular wall motion with ejection fraction of 56%. Electronically Signed   By: MYRTIS Stammer M.D.   On: 10/17/2023 14:34     Carcinoma of upper-outer quadrant of right breast in female, estrogen receptor positive (HCC) # RIGHT breast- INVASIVE DUCTAL CARCINOMA WITH PAPILLARY FEATURES- T2 [3.7cm] & positive IMC intramammary lymph node-  cN0-positive for LVI ; grade 2 -ER 95% PR 80% HER2/neu 1+/negative. Ki-67  50%- ONE LYMPH NODE, POSITIVE FOR METASTATIC CARCINOMA (1/1); METASTATIC FOCUS: 4 MM; SUSPICIOUS FOR EXTRANODAL EXTENSION Dr.Cintron- ONCOTYPE- 28. Discussed the role of endocrine therapy-given ER/PR positive disease postsurgery; antihormone pill 1 a day for 10 years;  adjuvant Zometa. Also consider CDK- inhibitors/ PARP inhibitors post surgery. Given multifocality-large size of the tumor; given BRCA 1 positive- PLAN with mastectomy with contralateral prophylactic mastectomy. AUG 18th, 2025- [facility] scan-ejection fraction of 56%.# ON neo-adjuvant chemo- taxol  weekly- with carbo q 3W; and followed by  Adriamycin-Cytoxan every 3  weeks x 4 cycles  # Currently- ON neo-adjuvant chemo- taxol  weekly- with carbo q 3W; Proceed with cycle #2- day-15 Labs-CBC/chemistries were reviewed  with the patient.  Will plan to do an ultrasound/mammogram after cycle #3.  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 Recommend Icepack.   # hypokalemia-likely from diarrhea.-Recommend K-Dur twice a day and also add 10 of KCl with the treatment  # diarrhea -G-1-2- from chemo- metformin - STOPPED metformin -  Imodium prn/ ADDlomotil prn.      .  # GERD:  on PPI- stable.   # HTN:  stable.    # Hx of Borderline DM-  s/p nutrition- STOPPED  metformin  1000 mg ER/ [sec to Diarrhea]- add glipizide  5 mg XL /day  # BRCA-positive- prophylactic contralateral mastectomy.  Consider bilateral prophylactic oophorectomy down the line.  # Social- FMLA/disability-    Weeklylabs;chemo- D-8- NO MD- # add 10 kcl to weekly chemo appts.   PS-  # DISPOSITION: # chemo today; HOLD 10 kcl today # as per IS- Dr.B      Cindy JONELLE Joe, MD 11/15/2023 9:19 AM

## 2023-11-15 NOTE — Progress Notes (Signed)
 C/o diarrhea 3-4 times a day. Taking lomotil . Thinking about stopping metformin  to see if that helps.  C/o nose bleeding for a week, just a little when she blows her nose.

## 2023-11-15 NOTE — Patient Instructions (Signed)
 [facility] - A DEPT OF [facility]  Discharge Instructions: Thank you for choosing [facility] to provide your oncology and hematology care.  If you have a lab appointment with the Cancer Center, please go directly to the Cancer Center and check in at the registration area.  Wear comfortable clothing and clothing appropriate for easy access to any Portacath or PICC line.   We strive to give you quality time with your provider. You may need to reschedule your appointment if you arrive late (15 or more minutes).  Arriving late affects you and other patients whose appointments are after yours.  Also, if you miss three or more appointments without notifying the office, you may be dismissed from the clinic at the provider's discretion.      For prescription refill requests, have your pharmacy contact our office and allow 72 hours for refills to be completed.    Today you received the following chemotherapy and/or immunotherapy agents Taxol.      To help prevent nausea and vomiting after your treatment, we encourage you to take your nausea medication as directed.  BELOW ARE SYMPTOMS THAT SHOULD BE REPORTED IMMEDIATELY: *FEVER GREATER THAN 100.4 F (38 C) OR HIGHER *CHILLS OR SWEATING *NAUSEA AND VOMITING THAT IS NOT CONTROLLED WITH YOUR NAUSEA MEDICATION *UNUSUAL SHORTNESS OF BREATH *UNUSUAL BRUISING OR BLEEDING *URINARY PROBLEMS (pain or burning when urinating, or frequent urination) *BOWEL PROBLEMS (unusual diarrhea, constipation, pain near the anus) TENDERNESS IN MOUTH AND THROAT WITH OR WITHOUT PRESENCE OF ULCERS (sore throat, sores in mouth, or a toothache) UNUSUAL RASH, SWELLING OR PAIN  UNUSUAL VAGINAL DISCHARGE OR ITCHING   Items with * indicate a potential emergency and should be followed up as soon as possible or go to the Emergency Department if any problems should occur.  Please show the CHEMOTHERAPY ALERT CARD or IMMUNOTHERAPY ALERT  CARD at check-in to the Emergency Department and triage nurse.  Should you have questions after your visit or need to cancel or reschedule your appointment, please contact [facility] - A DEPT OF [facility]  [phone] and follow the prompts.  Office hours are 8:00 a.m. to 4:30 p.m. Monday - Friday. Please note that voicemails left after 4:00 p.m. may not be returned until the following business day.  We are closed weekends and major holidays. You have access to a nurse at all times for urgent questions. Please call the main number to the clinic [phone] and follow the prompts.  For any non-urgent questions, you may also contact your provider using MyChart. We now offer e-Visits for anyone 82 and older to request care online for non-urgent symptoms. For details visit [email or web address].   Also download the MyChart app! Go to the app store, search "MyChart", open the app, select [facility], and log in with your MyChart username and password.

## 2023-11-15 NOTE — Assessment & Plan Note (Addendum)
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 Ki-67 50%- ONE LYMPH NODE, POSITIVE FOR METASTATIC CARCINOMA (1/1); METASTATIC FOCUS: 4 MM; SUSPICIOUS FOR EXTRANODAL EXTENSION Dr.Cintron- ONCOTYPE- 28. Discussed the role of endocrine therapy-given ER/PR positive disease postsurgery; antihormone pill 1 a day for 10 years;  adjuvant Zometa. Also consider CDK- inhibitors/ PARP inhibitors post surgery. Given multifocality-large size of the tumor; given BRCA 1 positive- PLAN with mastectomy with contralateral prophylactic mastectomy. AUG 18th, 2025- [facility] scan-ejection fraction of 56%.# ON neo-adjuvant chemo- taxol  weekly- with carbo q 3W; and followed by  Adriamycin-Cytoxan every 3  weeks x 4 cycles  # Currently- ON neo-adjuvant chemo- taxol  weekly- with carbo q 3W; Proceed with cycle #2- day-15 Labs-CBC/chemistries were reviewed with the patient.  Will plan to do an ultrasound/mammogram after cycle #3.  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 Recommend Icepack.   # hypokalemia-likely from diarrhea.-Recommend K-Dur twice a day and also add 10 of KCl with the treatment  # diarrhea -G-1-2- from chemo- metformin - STOPPED metformin -  Imodium prn/ ADDlomotil prn.      .  # GERD:  on PPI- stable.   # HTN:  stable.    # Hx of Borderline DM-  s/p nutrition- STOPPED  metformin  1000 mg ER/ [sec to Diarrhea]- add glipizide  5 mg XL /day  # BRCA-positive- prophylactic contralateral mastectomy.  Consider bilateral prophylactic oophorectomy down the line.  # Social- FMLA/disability-    Weeklylabs;chemo- D-8- NO MD- # add 10 kcl to weekly chemo appts.   PS-  # DISPOSITION: # chemo today; HOLD 10 kcl today # as per IS- Dr.B

## 2023-11-21 MED FILL — Fosaprepitant Dimeglumine For IV Infusion 150 MG (Base Eq): INTRAVENOUS | Qty: 5 | Status: AC

## 2023-11-22 ENCOUNTER — Inpatient Hospital Stay

## 2023-11-22 ENCOUNTER — Inpatient Hospital Stay (HOSPITAL_BASED_OUTPATIENT_CLINIC_OR_DEPARTMENT_OTHER): Admitting: Internal Medicine

## 2023-11-22 ENCOUNTER — Encounter: Payer: Self-pay | Admitting: Internal Medicine

## 2023-11-22 VITALS — BP 137/84 | HR 80 | Temp 98.1°F | Resp 18 | Ht 65.0 in | Wt 175.4 lb

## 2023-11-22 DIAGNOSIS — Z170 Estrogen receptor positive status [ER+]: Secondary | ICD-10-CM

## 2023-11-22 DIAGNOSIS — Z5111 Encounter for antineoplastic chemotherapy: Secondary | ICD-10-CM | POA: Diagnosis not present

## 2023-11-22 DIAGNOSIS — C50411 Malignant neoplasm of upper-outer quadrant of right female breast: Secondary | ICD-10-CM

## 2023-11-22 LAB — CBC WITH DIFFERENTIAL (CANCER CENTER ONLY)
Abs Immature Granulocytes: 0.02 K/uL (ref 0.00–0.07)
Basophils Absolute: 0.1 K/uL (ref 0.0–0.1)
Basophils Relative: 1 %
Eosinophils Absolute: 0 K/uL (ref 0.0–0.5)
Eosinophils Relative: 1 %
HCT: 26.7 % — ABNORMAL LOW (ref 36.0–46.0)
Hemoglobin: 9.1 g/dL — ABNORMAL LOW (ref 12.0–15.0)
Immature Granulocytes: 1 %
Lymphocytes Relative: 46 %
Lymphs Abs: 2 K/uL (ref 0.7–4.0)
MCH: 31.3 pg (ref 26.0–34.0)
MCHC: 34.1 g/dL (ref 30.0–36.0)
MCV: 91.8 fL (ref 80.0–100.0)
Monocytes Absolute: 0.2 K/uL (ref 0.1–1.0)
Monocytes Relative: 6 %
Neutro Abs: 2 K/uL (ref 1.7–7.7)
Neutrophils Relative %: 45 %
Platelet Count: 275 K/uL (ref 150–400)
RBC: 2.91 MIL/uL — ABNORMAL LOW (ref 3.87–5.11)
RDW: 14.7 % (ref 11.5–15.5)
WBC Count: 4.4 K/uL (ref 4.0–10.5)
nRBC: 0.5 % — ABNORMAL HIGH (ref 0.0–0.2)

## 2023-11-22 LAB — CMP (CANCER CENTER ONLY)
ALT: 24 U/L (ref 0–44)
AST: 22 U/L (ref 15–41)
Albumin: 3.6 g/dL (ref 3.5–5.0)
Alkaline Phosphatase: 58 U/L (ref 38–126)
Anion gap: 6 (ref 5–15)
BUN: 12 mg/dL (ref 8–23)
CO2: 26 mmol/L (ref 22–32)
Calcium: 9.8 mg/dL (ref 8.9–10.3)
Chloride: 109 mmol/L (ref 98–111)
Creatinine: 0.54 mg/dL (ref 0.44–1.00)
GFR, Estimated: >60 mL/min (ref >60)
Glucose, Bld: 133 mg/dL — ABNORMAL HIGH (ref 70–99)
Potassium: 3.7 mmol/L (ref 3.5–5.1)
Sodium: 141 mmol/L (ref 135–145)
Total Bilirubin: 0.5 mg/dL (ref 0.0–1.2)
Total Protein: 6.5 g/dL (ref 6.5–8.1)

## 2023-11-22 NOTE — Progress Notes (Signed)
 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   # JULY-AUG 2025- RIGHT BREAST T2; intramammary lymph node positive- N-1- ER-PR POSITIVE: her 2 neg; Ki-67-50%; right breast cancer with T2N1- ER/PR + her 2 NEG breast cancer.   # AUG 14th, 2025-   # Colonoscopy [Dr.sankar-Neg June 2017]  # Smoker; BRCA-1 positive   Primary cancer of right upper lobe of lung (HCC)  10/26/2015 Initial Diagnosis   Primary cancer of right upper lobe of lung (HCC)   Carcinoma of upper-outer quadrant of right breast in female, estrogen receptor positive (HCC)  09/12/2023 Initial Diagnosis   Carcinoma of upper-outer quadrant of right breast in female, estrogen receptor positive (HCC)   09/12/2023 Cancer Staging   Staging form: Breast, AJCC 8th Edition - Clinical: Stage IIA (cT2, cN1, cM0, G2, ER+, PR+, HER2-) - Signed by Rennie Cindy SAUNDERS, MD on 09/29/2023 Histologic grading system: 3 grade system   10/12/2023 -  Chemotherapy   Patient is on Treatment Plan : BREAST Paclitaxel  q7d / AC q21d       HISTORY OF PRESENTING ILLNESS: Patient ambulating-independently. ALONE>   Connie Osborne 63 y.o.  female pleasant patient with  remote history of stage I lung cancer s/p surgery; and BRCA-1- right breast cancer with T2N1- ER/PR + her 2 NEG breast cancer on neoadjuvant chemotherapy.   Being treated for a UTI with  nitrofuratoin. Feeling abdominal and bowel discomfort today.  Also noted to have some blood in stool- this AM- with cramping in abdomen.  NO Nausea vomiting.   Complains of mild tingling and numbness in extremities.   Patient noted to have decreased size of the breast mass.   Review of Systems  Constitutional:  Positive for malaise/fatigue. Negative for chills, diaphoresis, fever and weight loss.  HENT:  Negative for nosebleeds and sore throat.   Eyes:  Negative for double vision.  Respiratory:  Negative for cough, hemoptysis, sputum production, shortness of breath and wheezing.   Cardiovascular:  Negative for chest pain, palpitations, orthopnea and leg swelling.  Gastrointestinal:  Negative for abdominal pain, blood in stool, constipation, diarrhea, heartburn, melena, nausea and vomiting.  Genitourinary:  Negative for dysuria, frequency and urgency.  Musculoskeletal:  Positive for back pain and joint pain.  Skin: Negative.  Negative for itching and rash.  Neurological:  Negative for dizziness, tingling, focal weakness, weakness and headaches.  Endo/Heme/Allergies:  Does not bruise/bleed easily.  Psychiatric/Behavioral:  Negative for depression. The patient is not nervous/anxious and does not have insomnia.     MEDICAL HISTORY:  Past Medical History:  Diagnosis Date   Allergy    Anxiety    Arthritis    Breast cancer (HCC) 2014   Left- Radiation; BRCA 1 +    COPD (chronic obstructive pulmonary disease) (HCC)    Coughing up blood    GERD (gastroesophageal reflux disease)    Hypertension    Lung cancer (HCC)    Carcinoma, right upper lobe  Personal history of radiation therapy    Pre-diabetes    Shortness of breath dyspnea    Vitamin D deficiency     SURGICAL HISTORY: Past Surgical History:  Procedure Laterality Date   ABDOMINAL HYSTERECTOMY     BREAST BIOPSY Left 2014   +   BREAST BIOPSY Left 06/11/2019   stereo bx, x-clip, negative   BREAST BIOPSY Right 08/30/2023    US  RT BREAST BX W LOC DEV 1ST LESION IMG BX SPEC US  GUIDE 08/30/2023 [facility]   BREAST BIOPSY Right 08/30/2023   US  RT BREAST BX W LOC DEV EA ADD LESION IMG BX SPEC US  GUIDE 08/30/2023 [facility]   BREAST EXCISIONAL BIOPSY Left 2014   BREAST LUMPECTOMY Left 2014   BREAST SURGERY Left 2014   lumpectomy   COLONOSCOPY WITH PROPOFOL  N/A 08/18/2015   Procedure: COLONOSCOPY WITH PROPOFOL ;  Surgeon: Louanne KANDICE Muse, MD;  Location: [facility];  Service: Endoscopy;  Laterality: N/A;   COLONOSCOPY WITH PROPOFOL  N/A 10/12/2016   Procedure: COLONOSCOPY WITH PROPOFOL ;  Surgeon: Muse Louanne KANDICE, MD;  Location: [facility];  Service: Endoscopy;  Laterality: N/A;   ELECTROMAGNETIC NAVIGATION BROCHOSCOPY Right 07/28/2015   Procedure: ELECTROMAGNETIC NAVIGATION BRONCHOSCOPY;  Surgeon: Nickolas Cellar, MD;  Location: [facility];  Service: Cardiopulmonary;  Laterality: Right;   FOOT SURGERY     FRACTURE SURGERY     GANGLION CYST EXCISION     PORTACATH PLACEMENT N/A 10/02/2023   Procedure: INSERTION, TUNNELED CENTRAL VENOUS DEVICE, WITH PORT;  Surgeon: Rodolph Romano, MD;  Location: [facility];  Service: General;  Laterality: N/A;   THORACOTOMY/LOBECTOMY Right 09/14/2015   Procedure: THORACOTOMY/LOBECTOMY;  Surgeon: Louanne KANDICE Muse, MD;  Location: [facility];  Service: Thoracic;  Laterality: Right;    SOCIAL HISTORY: Social History   Socioeconomic History   Marital status: Single    Spouse name: Not on file   Number of children: Not on file   Years of education: Not on file   Highest education level: Not on file  Occupational History   Not on file  Tobacco Use   Smoking status: Former    Current packs/day: 0.00    Average packs/day: 1 pack/day for 30.0 years (30.0 ttl pk-yrs)    Types: Cigarettes    Start date: 07/29/1985    Quit date: 07/30/2015    Years since quitting: 8.3   Smokeless tobacco: Never  Vaping Use   Vaping status: Never Used  Substance and Sexual  Activity   Alcohol use: No    Alcohol/week: 0.0 standard drinks of alcohol   Drug use: No   Sexual activity: Not on file  Other Topics Concern   Not on file  Social History Narrative   Lives alone   Social Drivers of Health   Financial Resource Strain: Medium Risk (09/07/2023)   Received from [facility]   Overall Financial Resource Strain (CARDIA)    Difficulty of Paying Living Expenses: Somewhat hard  Food Insecurity: No Food Insecurity (09/12/2023)   Hunger Vital Sign    Worried About Running Out of Food in the Last Year: Never true    Ran Out of Food in the Last Year: Never true  Recent Concern: Food Insecurity - Food Insecurity Present (09/07/2023)   Received from [facility]   Hunger Vital Sign    Within the past 12 months, you worried that your food would run out before you got the money to buy more.: Sometimes true    Within the past 12  months, the food you bought just didn't last and you didn't have money to get more.: Never true  Transportation Needs: No Transportation Needs (09/12/2023)   PRAPARE - Administrator, Civil Service (Medical): No    Lack of Transportation (Non-Medical): No  Physical Activity: Not on file  Stress: Not on file  Social Connections: Not on file  Intimate Partner Violence: Not At Risk (09/12/2023)   Humiliation, Afraid, Rape, and Kick questionnaire    Fear of Current or Ex-Partner: No    Emotionally Abused: No    Physically Abused: No    Sexually Abused: No    FAMILY HISTORY: Family History  Problem Relation Age of Onset   Hypertension Mother    Diabetes Mellitus II Mother    Cancer Mother        mets   Cancer Father        long cancer   Cancer Sister 68       breast   Breast cancer Sister 33   Cancer Sister 19       breast   Breast cancer Sister 22   Lung cancer Brother    Cancer Other        breast    ALLERGIES:  is allergic to accupril [quinapril hcl] and percocet  [oxycodone -acetaminophen ].  MEDICATIONS:  Current Outpatient Medications  Medication Sig Dispense Refill   amLODipine  (NORVASC ) 10 MG tablet Take 10 mg by mouth daily with lunch.      Calcium Carb-Cholecalciferol (CALCIUM + VITAMIN D3 PO) Take 1 tablet by mouth in the morning and at bedtime.     cetirizine (ZYRTEC) 10 MG tablet Take 10 mg by mouth as needed for allergies.     diphenoxylate -atropine  (LOMOTIL ) 2.5-0.025 MG tablet Take 1 tablet by mouth 4 (four) times daily as needed for diarrhea or loose stools. Take it along with immodium 60 tablet 0   famotidine  (PEPCID ) 20 MG tablet Take 20 mg by mouth daily as needed for heartburn or indigestion.     glipiZIDE  (GLUCOTROL  XL) 5 MG 24 hr tablet Take 1 tablet (5 mg total) by mouth daily with breakfast. 30 tablet 6   lidocaine -prilocaine  (EMLA ) cream Apply on the port. 30 -45 min  prior to port access. 30 g 3   losartan  (COZAAR ) 100 MG tablet Take 100 mg by mouth daily with lunch.      metoprolol  succinate (TOPROL -XL) 25 MG 24 hr tablet Take 25 mg by mouth daily with lunch.      ondansetron  (ZOFRAN ) 8 MG tablet One pill every 8 hours as needed for nausea/vomitting. 40 tablet 1   Potassium Chloride  ER 20 MEQ TBCR Take 1 tablet (20 mEq total) by mouth 2 (two) times daily. 120 tablet 1   prochlorperazine  (COMPAZINE ) 10 MG tablet Take 1 tablet (10 mg total) by mouth every 6 (six) hours as needed for nausea or vomiting. 30 tablet 0   traMADol  (ULTRAM ) 50 MG tablet Take 1 tablet (50 mg total) by mouth every 6 (six) hours as needed. 10 tablet 0   No current facility-administered medications for this visit.    PHYSICAL EXAMINATION:   Vitals:   11/22/23 0823  BP: 137/84  Pulse: 80  Resp: 18  Temp: 98.1 F (36.7 C)  SpO2: 100%   Filed Weights   11/22/23 0823  Weight: 175 lb 6.4 oz (79.6 kg)   I have all things marked out  Physical Exam Vitals and nursing note reviewed.  HENT:  Head: Normocephalic and atraumatic.     Mouth/Throat:      Pharynx: Oropharynx is clear.  Eyes:     Extraocular Movements: Extraocular movements intact.     Pupils: Pupils are equal, round, and reactive to light.  Cardiovascular:     Rate and Rhythm: Normal rate and regular rhythm.  Pulmonary:     Comments: Decreased breath sounds bilaterally.  Abdominal:     Palpations: Abdomen is soft.  Musculoskeletal:        General: Normal range of motion.     Cervical back: Normal range of motion.  Skin:    General: Skin is warm.  Neurological:     General: No focal deficit present.     Mental Status: She is alert and oriented to person, place, and time.  Psychiatric:        Behavior: Behavior normal.        Judgment: Judgment normal.     LABORATORY DATA:  I have reviewed the data as listed Lab Results  Component Value Date   WBC 4.4 11/22/2023   HGB 9.1 (L) 11/22/2023   HCT 26.7 (L) 11/22/2023   MCV 91.8 11/22/2023   PLT 275 11/22/2023   Recent Labs    11/08/23 1125 11/15/23 0758 11/22/23 0822  NA 137 140 141  K 3.6 3.5 3.7  CL 103 108 109  CO2 25 24 26   GLUCOSE 126* 126* 133*  BUN 17 12 12   CREATININE 0.62 0.50 0.54  CALCIUM 9.5 9.2 9.8  GFRNONAA >60 >60 >60  PROT 6.4* 6.5 6.5  ALBUMIN 3.6 3.7 3.6  AST 22 20 22   ALT 23 21 24   ALKPHOS 66 66 58  BILITOT 0.4 0.6 0.5    RADIOGRAPHIC STUDIES: I have personally reviewed the radiological images as listed and agreed with the findings in the report. No results found.    Carcinoma of upper-outer quadrant of right breast in female, estrogen receptor positive (HCC) # RIGHT breast- INVASIVE DUCTAL CARCINOMA WITH PAPILLARY FEATURES- T2 [3.7cm] & positive IMC intramammary lymph node-  cN0-positive for LVI ; grade 2 -ER 95% PR 80% HER2/neu 1+/negative. Ki-67 50%- ONE LYMPH NODE, POSITIVE FOR METASTATIC CARCINOMA (1/1); METASTATIC FOCUS: 4 MM; SUSPICIOUS FOR EXTRANODAL EXTENSION Dr.Cintron- ONCOTYPE- 28. Discussed the role of endocrine therapy-given ER/PR positive disease  postsurgery; antihormone pill 1 a day for 10 years;  adjuvant Zometa. Also consider CDK- inhibitors/ PARP inhibitors post surgery. Given multifocality-large size of the tumor; given BRCA 1 positive- PLAN with mastectomy with contralateral prophylactic mastectomy. AUG 18th, 2025- [facility] scan-ejection fraction of 56%.# ON neo-adjuvant chemo- taxol  weekly- with carbo q 3W; and followed by  Adriamycin-Cytoxan every 3  weeks x 4 cycles  # CURRENTLY ON neo-adjuvant chemo- taxol  weekly- with carbo q 3W; GIVEN UTI- HOLD  with cycle #3- day-1 Labs-CBC/chemistries were reviewed with the patient.  Will plan to do an ultrasound/mammogram after cycle #3.  # UTI- [as per PCP]- no flank pain- with dysuria/increased frequency- currently on macrobid started 9/23 x5 days-   # blood per rectum- with stool- sec to hemorrhoids monitor for now.   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 Recommend Icepack-stable.  # hypokalemia-likely from diarrhea.-continue  K-Dur twice a day and also prn 10 of KCl with the treatment  # diarrhea -G-1-2- from chemo- metformin - STOPPED metformin -  Imodium prn/ ADDlomotil prn.      .  # GERD:  on PPI- stable.   # HTN:  stable.    # Hx of  Borderline DM-  s/p nutrition- STOPPED  metformin  1000 mg ER/ [sec to Diarrhea]- add glipizide  5 mg XL /day- stable.  # BRCA-positive- prophylactic contralateral mastectomy.  Consider bilateral prophylactic oophorectomy down the line.  # Social- FMLA/disability-    Weeklylabs;chemo- D-8- NO MD- # add 10 kcl to weekly chemo appts.   PS-  # DISPOSITION: # HOLD chemo; HOLD 10 kcl today; de-access.  #  OK to double book next week- MD; and then as per IS-  # cancel OCT 8th MD appt- keep others- #  Dr.B      Cindy JONELLE Joe, MD 11/22/2023 9:34 AM

## 2023-11-22 NOTE — Progress Notes (Unsigned)
 No treatment today, port needle deaccessed.

## 2023-11-22 NOTE — Progress Notes (Signed)
 Nutrition  RD planning to see patient during infusion but cancelled today. Will follow-up on 10/8  Athen Riel B. Dasie SOLON, CSO, LDN Registered Dietitian [phone]

## 2023-11-22 NOTE — Progress Notes (Signed)
 Being treated for a UTI with nitrofuratoin. Feeling abdominal and bowel discomfort today.

## 2023-11-22 NOTE — Assessment & Plan Note (Addendum)
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 Ki-67 50%- ONE LYMPH NODE, POSITIVE FOR METASTATIC CARCINOMA (1/1); METASTATIC FOCUS: 4 MM; SUSPICIOUS FOR EXTRANODAL EXTENSION Dr.Cintron- ONCOTYPE- 28. Discussed the role of endocrine therapy-given ER/PR positive disease postsurgery; antihormone pill 1 a day for 10 years;  adjuvant Zometa. Also consider CDK- inhibitors/ PARP inhibitors post surgery. Given multifocality-large size of the tumor; given BRCA 1 positive- PLAN with mastectomy with contralateral prophylactic mastectomy. AUG 18th, 2025- [facility] scan-ejection fraction of 56%.# ON neo-adjuvant chemo- taxol  weekly- with carbo q 3W; and followed by  Adriamycin-Cytoxan every 3  weeks x 4 cycles  # CURRENTLY ON neo-adjuvant chemo- taxol  weekly- with carbo q 3W; GIVEN UTI- HOLD  with cycle #3- day-1 Labs-CBC/chemistries were reviewed with the patient.  Will plan to do an ultrasound/mammogram after cycle #3.  # UTI- [as per PCP]- no flank pain- with dysuria/increased frequency- currently on macrobid started 9/23 x5 days-   # blood per rectum- with stool- sec to hemorrhoids monitor for now.   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 Recommend Icepack-stable.  # hypokalemia-likely from diarrhea.-continue  K-Dur twice a day and also prn 10 of KCl with the treatment  # diarrhea -G-1-2- from chemo- metformin - STOPPED metformin -  Imodium prn/ ADDlomotil prn.      .  # GERD:  on PPI- stable.   # HTN:  stable.    # Hx of Borderline DM-  s/p nutrition- STOPPED  metformin  1000 mg ER/ [sec to Diarrhea]- add glipizide  5 mg XL /day- stable.  # BRCA-positive- prophylactic contralateral mastectomy.  Consider bilateral prophylactic oophorectomy down the line.  # Social- FMLA/disability-    Weeklylabs;chemo- D-8- NO MD- # add 10 kcl to weekly chemo appts.   PS-  #  DISPOSITION: # HOLD chemo; HOLD 10 kcl today; de-access.  #  OK to double book next week- MD; and then as per IS-  # cancel OCT 8th MD appt- keep others- #  Dr.B

## 2023-11-23 ENCOUNTER — Other Ambulatory Visit: Payer: Self-pay

## 2023-11-28 ENCOUNTER — Other Ambulatory Visit: Payer: Self-pay

## 2023-11-28 MED FILL — Fosaprepitant Dimeglumine For IV Infusion 150 MG (Base Eq): INTRAVENOUS | Qty: 5 | Status: AC

## 2023-11-29 ENCOUNTER — Ambulatory Visit: Admitting: Internal Medicine

## 2023-11-29 ENCOUNTER — Ambulatory Visit

## 2023-11-29 ENCOUNTER — Inpatient Hospital Stay: Attending: Internal Medicine

## 2023-11-29 ENCOUNTER — Inpatient Hospital Stay

## 2023-11-29 ENCOUNTER — Inpatient Hospital Stay: Admitting: Internal Medicine

## 2023-11-29 ENCOUNTER — Encounter: Payer: Self-pay | Admitting: Internal Medicine

## 2023-11-29 ENCOUNTER — Other Ambulatory Visit

## 2023-11-29 VITALS — BP 123/68 | HR 93 | Temp 98.3°F | Resp 16 | Ht 65.0 in | Wt 176.6 lb

## 2023-11-29 VITALS — BP 111/73 | HR 82 | Resp 18

## 2023-11-29 DIAGNOSIS — Z79633 Long term (current) use of mitotic inhibitor: Secondary | ICD-10-CM | POA: Diagnosis not present

## 2023-11-29 DIAGNOSIS — C773 Secondary and unspecified malignant neoplasm of axilla and upper limb lymph nodes: Secondary | ICD-10-CM | POA: Insufficient documentation

## 2023-11-29 DIAGNOSIS — C50411 Malignant neoplasm of upper-outer quadrant of right female breast: Secondary | ICD-10-CM | POA: Insufficient documentation

## 2023-11-29 DIAGNOSIS — Z170 Estrogen receptor positive status [ER+]: Secondary | ICD-10-CM | POA: Diagnosis not present

## 2023-11-29 DIAGNOSIS — Z79630 Long term (current) use of alkylating agent: Secondary | ICD-10-CM | POA: Insufficient documentation

## 2023-11-29 DIAGNOSIS — Z5111 Encounter for antineoplastic chemotherapy: Secondary | ICD-10-CM | POA: Insufficient documentation

## 2023-11-29 DIAGNOSIS — Z5189 Encounter for other specified aftercare: Secondary | ICD-10-CM | POA: Diagnosis not present

## 2023-11-29 LAB — CBC WITH DIFFERENTIAL (CANCER CENTER ONLY)
Abs Immature Granulocytes: 0.02 K/uL (ref 0.00–0.07)
Basophils Absolute: 0 K/uL (ref 0.0–0.1)
Basophils Relative: 1 %
Eosinophils Absolute: 0.1 K/uL (ref 0.0–0.5)
Eosinophils Relative: 2 %
HCT: 29.7 % — ABNORMAL LOW (ref 36.0–46.0)
Hemoglobin: 9.9 g/dL — ABNORMAL LOW (ref 12.0–15.0)
Immature Granulocytes: 0 %
Lymphocytes Relative: 34 %
Lymphs Abs: 1.8 K/uL (ref 0.7–4.0)
MCH: 31 pg (ref 26.0–34.0)
MCHC: 33.3 g/dL (ref 30.0–36.0)
MCV: 93.1 fL (ref 80.0–100.0)
Monocytes Absolute: 0.4 K/uL (ref 0.1–1.0)
Monocytes Relative: 9 %
Neutro Abs: 2.8 K/uL (ref 1.7–7.7)
Neutrophils Relative %: 54 %
Platelet Count: 222 K/uL (ref 150–400)
RBC: 3.19 MIL/uL — ABNORMAL LOW (ref 3.87–5.11)
RDW: 16.8 % — ABNORMAL HIGH (ref 11.5–15.5)
WBC Count: 5.2 K/uL (ref 4.0–10.5)
nRBC: 0 % (ref 0.0–0.2)

## 2023-11-29 LAB — CMP (CANCER CENTER ONLY)
ALT: 17 U/L (ref 0–44)
AST: 26 U/L (ref 15–41)
Albumin: 3.6 g/dL (ref 3.5–5.0)
Alkaline Phosphatase: 65 U/L (ref 38–126)
Anion gap: 7 (ref 5–15)
BUN: 12 mg/dL (ref 8–23)
CO2: 24 mmol/L (ref 22–32)
Calcium: 9.5 mg/dL (ref 8.9–10.3)
Chloride: 110 mmol/L (ref 98–111)
Creatinine: 0.67 mg/dL (ref 0.44–1.00)
GFR, Estimated: >60 mL/min (ref >60)
Glucose, Bld: 185 mg/dL — ABNORMAL HIGH (ref 70–99)
Potassium: 3.6 mmol/L (ref 3.5–5.1)
Sodium: 141 mmol/L (ref 135–145)
Total Bilirubin: 0.7 mg/dL (ref 0.0–1.2)
Total Protein: 6.5 g/dL (ref 6.5–8.1)

## 2023-11-29 MED ORDER — SODIUM CHLORIDE 0.9 % IV SOLN
582.5000 mg | Freq: Once | INTRAVENOUS | Status: AC
  Administered 2023-11-29: 580 mg via INTRAVENOUS
  Filled 2023-11-29: qty 58

## 2023-11-29 MED ORDER — SODIUM CHLORIDE 0.9 % IV SOLN
150.0000 mg | Freq: Once | INTRAVENOUS | Status: AC
  Administered 2023-11-29: 150 mg via INTRAVENOUS
  Filled 2023-11-29: qty 150
  Filled 2023-11-29: qty 5

## 2023-11-29 MED ORDER — DIPHENHYDRAMINE HCL 50 MG/ML IJ SOLN
50.0000 mg | Freq: Once | INTRAMUSCULAR | Status: AC
  Administered 2023-11-29: 50 mg via INTRAVENOUS
  Filled 2023-11-29: qty 1

## 2023-11-29 MED ORDER — FAMOTIDINE IN NACL 20-0.9 MG/50ML-% IV SOLN
20.0000 mg | Freq: Once | INTRAVENOUS | Status: AC
  Administered 2023-11-29: 20 mg via INTRAVENOUS
  Filled 2023-11-29: qty 50

## 2023-11-29 MED ORDER — PALONOSETRON HCL INJECTION 0.25 MG/5ML
0.2500 mg | Freq: Once | INTRAVENOUS | Status: AC
  Administered 2023-11-29: 0.25 mg via INTRAVENOUS
  Filled 2023-11-29: qty 5

## 2023-11-29 MED ORDER — SODIUM CHLORIDE 0.9 % IV SOLN
INTRAVENOUS | Status: DC
  Filled 2023-11-29: qty 250

## 2023-11-29 MED ORDER — SODIUM CHLORIDE 0.9 % IV SOLN
80.0000 mg/m2 | Freq: Once | INTRAVENOUS | Status: AC
  Administered 2023-11-29: 156 mg via INTRAVENOUS
  Filled 2023-11-29: qty 26

## 2023-11-29 MED ORDER — DEXAMETHASONE SODIUM PHOSPHATE 10 MG/ML IJ SOLN
10.0000 mg | Freq: Once | INTRAMUSCULAR | Status: AC
  Administered 2023-11-29: 10 mg via INTRAVENOUS
  Filled 2023-11-29: qty 1

## 2023-11-29 NOTE — Assessment & Plan Note (Addendum)
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 Ki-67 50%- ONE LYMPH NODE, POSITIVE FOR METASTATIC CARCINOMA (1/1); METASTATIC FOCUS: 4 MM; SUSPICIOUS FOR EXTRANODAL EXTENSION Dr.Cintron- ONCOTYPE- 28. Discussed the role of endocrine therapy-given ER/PR positive disease postsurgery; antihormone pill 1 a day for 10 years;  adjuvant Zometa. Also consider CDK- inhibitors/ PARP inhibitors post surgery. Given multifocality-large size of the tumor; given BRCA 1 positive- PLAN with mastectomy with contralateral prophylactic mastectomy. AUG 18th, 2025- [facility] scan-ejection fraction of 56%.# ON neo-adjuvant chemo- taxol  weekly- with carbo q 3W; and followed by  Adriamycin-Cytoxan every 3  weeks x 4 cycles.  # Clinical response noted after 2 cycles of chemotherapy.  Will plan to do an ultrasound/mammogram after cycle #3. Ordered today mammo/Diagnostic right breast-ultrasound  # CURRENTLY ON neo-adjuvant chemo- taxol  weekly- with carbo q 3W; Proceed with  with cycle #3- day-1 Labs-CBC/chemistries were reviewed with the patient.   # UTI- [as per PCP]- s/p  on macrobid started 9/23 x5 days- Improved.   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 Recommend getting- compliance Icepack-stable.  # hypokalemia-]likely from diarrhea].-continue  K-Dur twice a day --stable.  # diarrhea -G-1-2- f[rom chemo- metformin - STOPPED metformin ]-IMPROVED  Imodium prn/ ADDlomotil prn.      .  # GERD:  on PPI- stable.   # HTN:  stable.    # Hx of Borderline DM-  s/p nutrition- STOPPED  metformin  1000 mg ER/ [sec to Diarrhea]- add glipizide  5 mg XL /day- stable.  # BRCA-positive- prophylactic contralateral mastectomy.  Consider bilateral prophylactic oophorectomy down the line.  # Social- FMLA/disability-     Weeklylabs;chemo- D-8- NO MD- # add 10 kcl to weekly chemo appts.   PS-  # DISPOSITION: # order mammo/Diagnostic right breast-ultrasound re: right breast cancer on neoadjuvant chemotherapy in next 3 weeks or so #  OK with chemo  # as per IS- Dr.B

## 2023-11-29 NOTE — Progress Notes (Signed)
 No concerns today

## 2023-11-29 NOTE — Patient Instructions (Signed)
 [facility] - A DEPT OF [facility]  Discharge Instructions: Thank you for choosing [facility] to provide your oncology and hematology care.  If you have a lab appointment with the Cancer Center, please go directly to the Cancer Center and check in at the registration area.  Wear comfortable clothing and clothing appropriate for easy access to any Portacath or PICC line.   We strive to give you quality time with your provider. You may need to reschedule your appointment if you arrive late (15 or more minutes).  Arriving late affects you and other patients whose appointments are after yours.  Also, if you miss three or more appointments without notifying the office, you may be dismissed from the clinic at the provider's discretion.      For prescription refill requests, have your pharmacy contact our office and allow 72 hours for refills to be completed.    Today you received the following chemotherapy and/or immunotherapy agents TAXOL and CARBOPLATIN       To help prevent nausea and vomiting after your treatment, we encourage you to take your nausea medication as directed.  BELOW ARE SYMPTOMS THAT SHOULD BE REPORTED IMMEDIATELY: *FEVER GREATER THAN 100.4 F (38 C) OR HIGHER *CHILLS OR SWEATING *NAUSEA AND VOMITING THAT IS NOT CONTROLLED WITH YOUR NAUSEA MEDICATION *UNUSUAL SHORTNESS OF BREATH *UNUSUAL BRUISING OR BLEEDING *URINARY PROBLEMS (pain or burning when urinating, or frequent urination) *BOWEL PROBLEMS (unusual diarrhea, constipation, pain near the anus) TENDERNESS IN MOUTH AND THROAT WITH OR WITHOUT PRESENCE OF ULCERS (sore throat, sores in mouth, or a toothache) UNUSUAL RASH, SWELLING OR PAIN  UNUSUAL VAGINAL DISCHARGE OR ITCHING   Items with * indicate a potential emergency and should be followed up as soon as possible or go to the Emergency Department if any problems should occur.  Please show the CHEMOTHERAPY ALERT CARD or  IMMUNOTHERAPY ALERT CARD at check-in to the Emergency Department and triage nurse.  Should you have questions after your visit or need to cancel or reschedule your appointment, please contact [facility] - A DEPT OF [facility]  [phone] and follow the prompts.  Office hours are 8:00 a.m. to 4:30 p.m. Monday - Friday. Please note that voicemails left after 4:00 p.m. may not be returned until the following business day.  We are closed weekends and major holidays. You have access to a nurse at all times for urgent questions. Please call the main number to the clinic [phone] and follow the prompts.  For any non-urgent questions, you may also contact your provider using MyChart. We now offer e-Visits for anyone 83 and older to request care online for non-urgent symptoms. For details visit [email or web address].   Also download the MyChart app! Go to the app store, search "MyChart", open the app, select [facility], and log in with your MyChart username and password.  Paclitaxel Injection What is this medication? PACLITAXEL (PAK li TAX el) treats some types of cancer. It works by slowing down the growth of cancer cells. This medicine may be used for other purposes; ask your health care provider or pharmacist if you have questions. COMMON BRAND NAME(S): Onxol, Taxol What should I tell my care team before I take this medication? They need to know if you have any of these conditions: Heart disease Liver disease Low white blood cell levels An unusual or allergic reaction to paclitaxel, other medications, foods, dyes, or preservatives  If you or your partner are pregnant or trying to get pregnant Breast-feeding How should I use this medication? This medication is injected into a vein. It is given by your care team in a hospital or clinic setting. Talk to your care team about the use of this medication in children. While it may be given to children for selected  conditions, precautions do apply. Overdosage: If you think you have taken too much of this medicine contact a poison control center or emergency room at once. NOTE: This medicine is only for you. Do not share this medicine with others. What if I miss a dose? Keep appointments for follow-up doses. It is important not to miss your dose. Call your care team if you are unable to keep an appointment. What may interact with this medication? Do not take this medication with any of the following: Live virus vaccines Other medications may affect the way this medication works. Talk with your care team about all of the medications you take. They may suggest changes to your treatment plan to lower the risk of side effects and to make sure your medications work as intended. This list may not describe all possible interactions. Give your health care provider a list of all the medicines, herbs, non-prescription drugs, or dietary supplements you use. Also tell them if you smoke, drink alcohol, or use illegal drugs. Some items may interact with your medicine. What should I watch for while using this medication? Your condition will be monitored carefully while you are receiving this medication. You may need blood work while taking this medication. This medication may make you feel generally unwell. This is not uncommon as chemotherapy can affect healthy cells as well as cancer cells. Report any side effects. Continue your course of treatment even though you feel ill unless your care team tells you to stop. This medication can cause serious allergic reactions. To reduce the risk, your care team may give you other medications to take before receiving this one. Be sure to follow the directions from your care team. This medication may increase your risk of getting an infection. Call your care team for advice if you get a fever, chills, sore throat, or other symptoms of a cold or flu. Do not treat yourself. Try to avoid  being around people who are sick. This medication may increase your risk to bruise or bleed. Call your care team if you notice any unusual bleeding. Be careful brushing or flossing your teeth or using a toothpick because you may get an infection or bleed more easily. If you have any dental work done, tell your dentist you are receiving this medication. Talk to your care team if you may be pregnant. Serious birth defects can occur if you take this medication during pregnancy. Talk to your care team before breastfeeding. Changes to your treatment plan may be needed. What side effects may I notice from receiving this medication? Side effects that you should report to your care team as soon as possible: Allergic reactions--skin rash, itching, hives, swelling of the face, lips, tongue, or throat Heart rhythm changes--fast or irregular heartbeat, dizziness, feeling faint or lightheaded, chest pain, trouble breathing Increase in blood pressure Infection--fever, chills, cough, sore throat, wounds that don't heal, pain or trouble when passing urine, general feeling of discomfort or being unwell Low blood pressure--dizziness, feeling faint or lightheaded, blurry vision Low red blood cell level--unusual weakness or fatigue, dizziness, headache, trouble breathing Painful swelling, warmth, or redness of the skin, blisters  or sores at the infusion site Pain, tingling, or numbness in the hands or feet Slow heartbeat--dizziness, feeling faint or lightheaded, confusion, trouble breathing, unusual weakness or fatigue Unusual bruising or bleeding Side effects that usually do not require medical attention (report to your care team if they continue or are bothersome): Diarrhea Hair loss Joint pain Loss of appetite Muscle pain Nausea Vomiting This list may not describe all possible side effects. Call your doctor for medical advice about side effects. You may report side effects to FDA at 1-800-FDA-1088. Where  should I keep my medication? This medication is given in a hospital or clinic. It will not be stored at home. NOTE: This sheet is a summary. It may not cover all possible information. If you have questions about this medicine, talk to your doctor, pharmacist, or health care provider.  2024 Elsevier/Gold Standard (2021-07-06 00:00:00)  Carboplatin Injection What is this medication? CARBOPLATIN (KAR boe pla tin) treats some types of cancer. It works by slowing down the growth of cancer cells. This medicine may be used for other purposes; ask your health care provider or pharmacist if you have questions. COMMON BRAND NAME(S): Paraplatin What should I tell my care team before I take this medication? They need to know if you have any of these conditions: Blood disorders Hearing problems Kidney disease Recent or ongoing radiation therapy An unusual or allergic reaction to carboplatin, cisplatin, other medications, foods, dyes, or preservatives Pregnant or trying to get pregnant Breast-feeding How should I use this medication? This medication is injected into a vein. It is given by your care team in a hospital or clinic setting. Talk to your care team about the use of this medication in children. Special care may be needed. Overdosage: If you think you have taken too much of this medicine contact a poison control center or emergency room at once. NOTE: This medicine is only for you. Do not share this medicine with others. What if I miss a dose? Keep appointments for follow-up doses. It is important not to miss your dose. Call your care team if you are unable to keep an appointment. What may interact with this medication? Medications for seizures Some antibiotics, such as amikacin, gentamicin, neomycin, streptomycin, tobramycin Vaccines This list may not describe all possible interactions. Give your health care provider a list of all the medicines, herbs, non-prescription drugs, or dietary  supplements you use. Also tell them if you smoke, drink alcohol, or use illegal drugs. Some items may interact with your medicine. What should I watch for while using this medication? Your condition will be monitored carefully while you are receiving this medication. You may need blood work while taking this medication. This medication may make you feel generally unwell. This is not uncommon, as chemotherapy can affect healthy cells as well as cancer cells. Report any side effects. Continue your course of treatment even though you feel ill unless your care team tells you to stop. In some cases, you may be given additional medications to help with side effects. Follow all directions for their use. This medication may increase your risk of getting an infection. Call your care team for advice if you get a fever, chills, sore throat, or other symptoms of a cold or flu. Do not treat yourself. Try to avoid being around people who are sick. Avoid taking medications that contain aspirin, acetaminophen, ibuprofen, naproxen, or ketoprofen unless instructed by your care team. These medications may hide a fever. Be careful brushing or flossing your  teeth or using a toothpick because you may get an infection or bleed more easily. If you have any dental work done, tell your dentist you are receiving this medication. Talk to your care team if you wish to become pregnant or think you might be pregnant. This medication can cause serious birth defects. Talk to your care team about effective forms of contraception. Do not breast-feed while taking this medication. What side effects may I notice from receiving this medication? Side effects that you should report to your care team as soon as possible: Allergic reactions--skin rash, itching, hives, swelling of the face, lips, tongue, or throat Infection--fever, chills, cough, sore throat, wounds that don't heal, pain or trouble when passing urine, general feeling of  discomfort or being unwell Low red blood cell level--unusual weakness or fatigue, dizziness, headache, trouble breathing Pain, tingling, or numbness in the hands or feet, muscle weakness, change in vision, confusion or trouble speaking, loss of balance or coordination, trouble walking, seizures Unusual bruising or bleeding Side effects that usually do not require medical attention (report to your care team if they continue or are bothersome): Hair loss Nausea Unusual weakness or fatigue Vomiting This list may not describe all possible side effects. Call your doctor for medical advice about side effects. You may report side effects to FDA at 1-800-FDA-1088. Where should I keep my medication? This medication is given in a hospital or clinic. It will not be stored at home. NOTE: This sheet is a summary. It may not cover all possible information. If you have questions about this medicine, talk to your doctor, pharmacist, or health care provider.  2024 Elsevier/Gold Standard (2021-06-08 00:00:00)

## 2023-11-29 NOTE — Progress Notes (Signed)
 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   # JULY-AUG 2025- RIGHT BREAST T2; intramammary lymph node positive- N-1- ER-PR POSITIVE: her 2 neg; Ki-67-50%; right breast cancer with T2N1- ER/PR + her 2 NEG breast cancer.   # AUG 14th, 2025-   # Colonoscopy [Dr.sankar-Neg June 2017]  # Smoker; BRCA-1 positive   Primary cancer of right upper lobe of lung (HCC)  10/26/2015 Initial Diagnosis   Primary cancer of right upper lobe of lung (HCC)   Carcinoma of upper-outer quadrant of right breast in female, estrogen receptor positive (HCC)  09/12/2023 Initial Diagnosis   Carcinoma of upper-outer quadrant of right breast in female, estrogen receptor positive (HCC)   09/12/2023 Cancer Staging   Staging form: Breast, AJCC 8th Edition - Clinical: Stage IIA (cT2, cN1, cM0, G2, ER+, PR+, HER2-) - Signed by Rennie Cindy SAUNDERS, MD on 09/29/2023 Histologic grading system: 3 grade system   10/12/2023 -  Chemotherapy   Patient is on Treatment Plan : BREAST Paclitaxel  q7d / AC q21d       HISTORY OF PRESENTING ILLNESS: Patient ambulating-independently. ALONE>   Connie Osborne 63 y.o.  female pleasant patient with  remote history of stage I lung cancer s/p surgery; and BRCA-1- right breast cancer with T2N1- ER/PR + her 2 NEG breast cancer on neoadjuvant chemotherapy.   Last week chemotherapy was held because  of recent UTI currently status post nitrofuratoin.  Noted to have significant improvement in symptoms.  No fever no chills.  Complains of mild tingling and numbness in extremities.   Patient noted to have decreased size of the breast mass.   Review of Systems  Constitutional:  Positive for malaise/fatigue. Negative for chills, diaphoresis, fever and weight loss.  HENT:  Negative for nosebleeds and sore throat.   Eyes:  Negative for double vision.  Respiratory:  Negative for cough, hemoptysis, sputum production, shortness of breath and wheezing.   Cardiovascular:  Negative for chest pain, palpitations, orthopnea and leg swelling.  Gastrointestinal:  Negative for abdominal pain, blood in stool, constipation, diarrhea, heartburn, melena, nausea and vomiting.  Genitourinary:  Negative for dysuria, frequency and urgency.  Musculoskeletal:  Positive for back pain and joint pain.  Skin: Negative.  Negative for itching and rash.  Neurological:  Negative for dizziness, tingling, focal weakness, weakness and headaches.  Endo/Heme/Allergies:  Does not bruise/bleed easily.  Psychiatric/Behavioral:  Negative for depression. The patient is not nervous/anxious and does not have insomnia.     MEDICAL HISTORY:  Past Medical History:  Diagnosis Date   Allergy    Anxiety    Arthritis    Breast cancer (HCC) 2014   Left- Radiation; BRCA 1 +    COPD (chronic obstructive pulmonary disease) (HCC)    Coughing up blood    GERD (gastroesophageal reflux disease)    Hypertension    Lung cancer (HCC)    Carcinoma, right upper lobe    Personal history of radiation therapy  Pre-diabetes    Shortness of breath dyspnea    Vitamin D deficiency     SURGICAL HISTORY: Past Surgical History:  Procedure Laterality Date   ABDOMINAL HYSTERECTOMY     BREAST BIOPSY Left 2014   +   BREAST BIOPSY Left 06/11/2019   stereo bx, x-clip, negative   BREAST BIOPSY Right 08/30/2023   US  RT BREAST BX W LOC DEV 1ST LESION  IMG BX SPEC US  GUIDE 08/30/2023 [facility]   BREAST BIOPSY Right 08/30/2023   US  RT BREAST BX W LOC DEV EA ADD LESION IMG BX SPEC US  GUIDE 08/30/2023 [facility]   BREAST EXCISIONAL BIOPSY Left 2014   BREAST LUMPECTOMY Left 2014   BREAST SURGERY Left 2014   lumpectomy   COLONOSCOPY WITH PROPOFOL  N/A 08/18/2015   Procedure: COLONOSCOPY WITH PROPOFOL ;  Surgeon: Louanne KANDICE Muse, MD;  Location: [facility];  Service: Endoscopy;  Laterality: N/A;   COLONOSCOPY WITH PROPOFOL  N/A 10/12/2016   Procedure: COLONOSCOPY WITH PROPOFOL ;  Surgeon: Muse Louanne KANDICE, MD;  Location: [facility];  Service: Endoscopy;  Laterality: N/A;   ELECTROMAGNETIC NAVIGATION BROCHOSCOPY Right 07/28/2015   Procedure: ELECTROMAGNETIC NAVIGATION BRONCHOSCOPY;  Surgeon: Nickolas Cellar, MD;  Location: [facility];  Service: Cardiopulmonary;  Laterality: Right;   FOOT SURGERY     FRACTURE SURGERY     GANGLION CYST EXCISION     PORTACATH PLACEMENT N/A 10/02/2023   Procedure: INSERTION, TUNNELED CENTRAL VENOUS DEVICE, WITH PORT;  Surgeon: Rodolph Romano, MD;  Location: [facility];  Service: General;  Laterality: N/A;   THORACOTOMY/LOBECTOMY Right 09/14/2015   Procedure: THORACOTOMY/LOBECTOMY;  Surgeon: Louanne KANDICE Muse, MD;  Location: [facility];  Service: Thoracic;  Laterality: Right;    SOCIAL HISTORY: Social History   Socioeconomic History   Marital status: Single    Spouse name: Not on file   Number of children: Not on file   Years of education: Not on file   Highest education level: Not on file  Occupational History   Not on file  Tobacco Use   Smoking status: Former    Current packs/day: 0.00    Average packs/day: 1 pack/day for 30.0 years (30.0 ttl pk-yrs)    Types: Cigarettes    Start date: 07/29/1985    Quit date: 07/30/2015    Years since quitting: 8.3   Smokeless tobacco: Never  Vaping Use   Vaping status: Never Used  Substance and Sexual Activity   Alcohol use: No     Alcohol/week: 0.0 standard drinks of alcohol   Drug use: No   Sexual activity: Not on file  Other Topics Concern   Not on file  Social History Narrative   Lives alone   Social Drivers of Health   Financial Resource Strain: Medium Risk (09/07/2023)   Received from [facility]   Overall Financial Resource Strain (CARDIA)    Difficulty of Paying Living Expenses: Somewhat hard  Food Insecurity: No Food Insecurity (09/12/2023)   Hunger Vital Sign    Worried About Running Out of Food in the Last Year: Never true    Ran Out of Food in the Last Year: Never true  Recent Concern: Food Insecurity - Food Insecurity Present (09/07/2023)   Received from [facility]   Hunger Vital Sign    Within the past 12 months, you worried that your food would run out before you got the money to buy more.: Sometimes true    Within the past 12 months, the food you bought just didn't last  and you didn't have money to get more.: Never true  Transportation Needs: No Transportation Needs (09/12/2023)   PRAPARE - Administrator, Civil Service (Medical): No    Lack of Transportation (Non-Medical): No  Physical Activity: Not on file  Stress: Not on file  Social Connections: Not on file  Intimate Partner Violence: Not At Risk (09/12/2023)   Humiliation, Afraid, Rape, and Kick questionnaire    Fear of Current or Ex-Partner: No    Emotionally Abused: No    Physically Abused: No    Sexually Abused: No    FAMILY HISTORY: Family History  Problem Relation Age of Onset   Hypertension Mother    Diabetes Mellitus II Mother    Cancer Mother        mets   Cancer Father        long cancer   Cancer Sister 20       breast   Breast cancer Sister 62   Cancer Sister 37       breast   Breast cancer Sister 58   Lung cancer Brother    Cancer Other        breast    ALLERGIES:  is allergic to accupril [quinapril hcl] and percocet [oxycodone -acetaminophen ].  MEDICATIONS:   Current Outpatient Medications  Medication Sig Dispense Refill   amLODipine  (NORVASC ) 10 MG tablet Take 10 mg by mouth daily with lunch.      Calcium Carb-Cholecalciferol (CALCIUM + VITAMIN D3 PO) Take 1 tablet by mouth in the morning and at bedtime.     cetirizine (ZYRTEC) 10 MG tablet Take 10 mg by mouth as needed for allergies.     diphenoxylate -atropine  (LOMOTIL ) 2.5-0.025 MG tablet Take 1 tablet by mouth 4 (four) times daily as needed for diarrhea or loose stools. Take it along with immodium 60 tablet 0   famotidine  (PEPCID ) 20 MG tablet Take 20 mg by mouth daily as needed for heartburn or indigestion.     glipiZIDE  (GLUCOTROL  XL) 5 MG 24 hr tablet Take 1 tablet (5 mg total) by mouth daily with breakfast. 30 tablet 6   lidocaine -prilocaine  (EMLA ) cream Apply on the port. 30 -45 min  prior to port access. 30 g 3   losartan  (COZAAR ) 100 MG tablet Take 100 mg by mouth daily with lunch.      metoprolol  succinate (TOPROL -XL) 25 MG 24 hr tablet Take 25 mg by mouth daily with lunch.      ondansetron  (ZOFRAN ) 8 MG tablet One pill every 8 hours as needed for nausea/vomitting. 40 tablet 1   Potassium Chloride  ER 20 MEQ TBCR Take 1 tablet (20 mEq total) by mouth 2 (two) times daily. 120 tablet 1   prochlorperazine  (COMPAZINE ) 10 MG tablet Take 1 tablet (10 mg total) by mouth every 6 (six) hours as needed for nausea or vomiting. 30 tablet 0   traMADol  (ULTRAM ) 50 MG tablet Take 1 tablet (50 mg total) by mouth every 6 (six) hours as needed. 10 tablet 0   No current facility-administered medications for this visit.   Facility-Administered Medications Ordered in Other Visits  Medication Dose Route Frequency Provider Last Rate Last Admin   0.9 %  sodium chloride  infusion   Intravenous Continuous Hinley Brimage R, MD 10 mL/hr at 11/29/23 0913 New Bag at 11/29/23 0913   CARBOplatin  (PARAPLATIN ) 580 mg in sodium chloride  0.9 % 250 mL chemo infusion  580 mg Intravenous Once Shaw Dobek R, MD        dexamethasone  (  DECADRON ) injection 10 mg  10 mg Intravenous Once Abdullah Rizzi R, MD       diphenhydrAMINE  (BENADRYL ) injection 50 mg  50 mg Intravenous Once Eleni Frank R, MD       famotidine  (PEPCID ) IVPB 20 mg premix  20 mg Intravenous Once Whitney Hillegass R, MD       fosaprepitant  (EMEND) 150 mg in sodium chloride  0.9 % 145 mL IVPB  150 mg Intravenous Once Jolynn Bajorek R, MD 450 mL/hr at 11/29/23 0914 150 mg at 11/29/23 9085   PACLitaxel  (TAXOL ) 156 mg in sodium chloride  0.9 % 250 mL chemo infusion (</= 80mg /m2)  80 mg/m2 (Treatment Plan Recorded) Intravenous Once Dorine Duffey R, MD       palonosetron  (ALOXI ) injection 0.25 mg  0.25 mg Intravenous Once Alim Cattell R, MD        PHYSICAL EXAMINATION:   Vitals:   11/29/23 0825 11/29/23 0835  BP: (!) 126/96 123/68  Pulse: 93   Resp: 16   Temp: 98.3 F (36.8 C)   SpO2: 100%    Filed Weights   11/29/23 0825  Weight: 176 lb 9.6 oz (80.1 kg)   10/1-improvement of the right breast mass.  Vaguely felt-   Physical Exam Vitals and nursing note reviewed.  HENT:     Head: Normocephalic and atraumatic.     Mouth/Throat:     Pharynx: Oropharynx is clear.  Eyes:     Extraocular Movements: Extraocular movements intact.     Pupils: Pupils are equal, round, and reactive to light.  Cardiovascular:     Rate and Rhythm: Normal rate and regular rhythm.  Pulmonary:     Comments: Decreased breath sounds bilaterally.  Abdominal:     Palpations: Abdomen is soft.  Musculoskeletal:        General: Normal range of motion.     Cervical back: Normal range of motion.  Skin:    General: Skin is warm.  Neurological:     General: No focal deficit present.     Mental Status: She is alert and oriented to person, place, and time.  Psychiatric:        Behavior: Behavior normal.        Judgment: Judgment normal.     LABORATORY DATA:  I have reviewed the data as listed Lab Results  Component Value Date    WBC 5.2 11/29/2023   HGB 9.9 (L) 11/29/2023   HCT 29.7 (L) 11/29/2023   MCV 93.1 11/29/2023   PLT 222 11/29/2023   Recent Labs    11/15/23 0758 11/22/23 0822 11/29/23 0813  NA 140 141 141  K 3.5 3.7 3.6  CL 108 109 110  CO2 24 26 24   GLUCOSE 126* 133* 185*  BUN 12 12 12   CREATININE 0.50 0.54 0.67  CALCIUM 9.2 9.8 9.5  GFRNONAA >60 >60 >60  PROT 6.5 6.5 6.5  ALBUMIN 3.7 3.6 3.6  AST 20 22 26   ALT 21 24 17   ALKPHOS 66 58 65  BILITOT 0.6 0.5 0.7    RADIOGRAPHIC STUDIES: I have personally reviewed the radiological images as listed and agreed with the findings in the report. No results found.    Carcinoma of upper-outer quadrant of right breast in female, estrogen receptor positive (HCC) # RIGHT breast- INVASIVE DUCTAL CARCINOMA WITH PAPILLARY FEATURES- T2 [3.7cm] & positive IMC intramammary lymph node-  cN0-positive for LVI ; grade 2 -ER 95% PR 80% HER2/neu 1+/negative. Ki-67 50%- ONE LYMPH NODE, POSITIVE FOR METASTATIC CARCINOMA (1/1); METASTATIC  FOCUS: 4 MM; SUSPICIOUS FOR EXTRANODAL EXTENSION Dr.Cintron- ONCOTYPE- 28. Discussed the role of endocrine therapy-given ER/PR positive disease postsurgery; antihormone pill 1 a day for 10 years;  adjuvant Zometa. Also consider CDK- inhibitors/ PARP inhibitors post surgery. Given multifocality-large size of the tumor; given BRCA 1 positive- PLAN with mastectomy with contralateral prophylactic mastectomy. AUG 18th, 2025- [facility] scan-ejection fraction of 56%.# ON neo-adjuvant chemo- taxol  weekly- with carbo q 3W; and followed by  Adriamycin-Cytoxan every 3  weeks x 4 cycles.  # Clinical response noted after 2 cycles of chemotherapy.  Will plan to do an ultrasound/mammogram after cycle #3. Ordered today mammo/Diagnostic right breast-ultrasound  # CURRENTLY ON neo-adjuvant chemo- taxol  weekly- with carbo q 3W; Proceed with  with cycle #3- day-1 Labs-CBC/chemistries were reviewed with the patient.   # UTI- [as per PCP]- s/p  on macrobid  started 9/23 x5 days- Improved.   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 Recommend getting- compliance Icepack-stable.  # hypokalemia-]likely from diarrhea].-continue  K-Dur twice a day --stable.  # diarrhea -G-1-2- f[rom chemo- metformin - STOPPED metformin ]-IMPROVED  Imodium prn/ ADDlomotil prn.      .  # GERD:  on PPI- stable.   # HTN:  stable.    # Hx of Borderline DM-  s/p nutrition- STOPPED  metformin  1000 mg ER/ [sec to Diarrhea]- add glipizide  5 mg XL /day- stable.  # BRCA-positive- prophylactic contralateral mastectomy.  Consider bilateral prophylactic oophorectomy down the line.  # Social- FMLA/disability-    Weeklylabs;chemo- D-8- NO MD- # add 10 kcl to weekly chemo appts.   PS-  # DISPOSITION: # order mammo/Diagnostic right breast-ultrasound re: right breast cancer on neoadjuvant chemotherapy in next 3 weeks or so #  OK with chemo  # as per IS- Dr.B      Cindy JONELLE Joe, MD 11/29/2023 9:27 AM

## 2023-12-01 ENCOUNTER — Other Ambulatory Visit: Payer: Self-pay

## 2023-12-06 ENCOUNTER — Other Ambulatory Visit

## 2023-12-06 ENCOUNTER — Ambulatory Visit: Admitting: Internal Medicine

## 2023-12-06 ENCOUNTER — Inpatient Hospital Stay

## 2023-12-06 VITALS — BP 141/83 | HR 87 | Temp 99.2°F | Resp 19 | Wt 174.9 lb

## 2023-12-06 DIAGNOSIS — C50411 Malignant neoplasm of upper-outer quadrant of right female breast: Secondary | ICD-10-CM

## 2023-12-06 DIAGNOSIS — Z5111 Encounter for antineoplastic chemotherapy: Secondary | ICD-10-CM | POA: Diagnosis not present

## 2023-12-06 LAB — CMP (CANCER CENTER ONLY)
ALT: 19 U/L (ref 0–44)
AST: 19 U/L (ref 15–41)
Albumin: 3.5 g/dL (ref 3.5–5.0)
Alkaline Phosphatase: 59 U/L (ref 38–126)
Anion gap: 5 (ref 5–15)
BUN: 19 mg/dL (ref 8–23)
CO2: 25 mmol/L (ref 22–32)
Calcium: 9.5 mg/dL (ref 8.9–10.3)
Chloride: 108 mmol/L (ref 98–111)
Creatinine: 0.56 mg/dL (ref 0.44–1.00)
GFR, Estimated: >60 mL/min (ref >60)
Glucose, Bld: 151 mg/dL — ABNORMAL HIGH (ref 70–99)
Potassium: 3.4 mmol/L — ABNORMAL LOW (ref 3.5–5.1)
Sodium: 138 mmol/L (ref 135–145)
Total Bilirubin: 0.4 mg/dL (ref 0.0–1.2)
Total Protein: 6.4 g/dL — ABNORMAL LOW (ref 6.5–8.1)

## 2023-12-06 LAB — CBC WITH DIFFERENTIAL (CANCER CENTER ONLY)
Abs Immature Granulocytes: 0.03 K/uL (ref 0.00–0.07)
Basophils Absolute: 0 K/uL (ref 0.0–0.1)
Basophils Relative: 1 %
Eosinophils Absolute: 0.1 K/uL (ref 0.0–0.5)
Eosinophils Relative: 1 %
HCT: 27.7 % — ABNORMAL LOW (ref 36.0–46.0)
Hemoglobin: 9.2 g/dL — ABNORMAL LOW (ref 12.0–15.0)
Immature Granulocytes: 1 %
Lymphocytes Relative: 29 %
Lymphs Abs: 1.6 K/uL (ref 0.7–4.0)
MCH: 30.6 pg (ref 26.0–34.0)
MCHC: 33.2 g/dL (ref 30.0–36.0)
MCV: 92 fL (ref 80.0–100.0)
Monocytes Absolute: 0.2 K/uL (ref 0.1–1.0)
Monocytes Relative: 4 %
Neutro Abs: 3.5 K/uL (ref 1.7–7.7)
Neutrophils Relative %: 64 %
Platelet Count: 271 K/uL (ref 150–400)
RBC: 3.01 MIL/uL — ABNORMAL LOW (ref 3.87–5.11)
RDW: 15.4 % (ref 11.5–15.5)
WBC Count: 5.5 K/uL (ref 4.0–10.5)
nRBC: 0 % (ref 0.0–0.2)

## 2023-12-06 MED ORDER — DIPHENHYDRAMINE HCL 50 MG/ML IJ SOLN
50.0000 mg | Freq: Once | INTRAMUSCULAR | Status: AC
  Administered 2023-12-06: 50 mg via INTRAVENOUS
  Filled 2023-12-06: qty 1

## 2023-12-06 MED ORDER — SODIUM CHLORIDE 0.9 % IV SOLN
INTRAVENOUS | Status: DC
  Filled 2023-12-06: qty 250

## 2023-12-06 MED ORDER — SODIUM CHLORIDE 0.9 % IV SOLN
80.0000 mg/m2 | Freq: Once | INTRAVENOUS | Status: AC
  Administered 2023-12-06: 156 mg via INTRAVENOUS
  Filled 2023-12-06: qty 26

## 2023-12-06 MED ORDER — DEXAMETHASONE SODIUM PHOSPHATE 10 MG/ML IJ SOLN
10.0000 mg | Freq: Once | INTRAMUSCULAR | Status: AC
  Administered 2023-12-06: 10 mg via INTRAVENOUS
  Filled 2023-12-06: qty 1

## 2023-12-06 MED ORDER — FAMOTIDINE IN NACL 20-0.9 MG/50ML-% IV SOLN
20.0000 mg | Freq: Once | INTRAVENOUS | Status: AC
  Administered 2023-12-06: 20 mg via INTRAVENOUS
  Filled 2023-12-06: qty 50

## 2023-12-06 NOTE — Progress Notes (Signed)
 Nutrition Follow-up:  Patient with right breast cancer.  Receiving taxol  and then followed by [facility]  Met with patient during infusion. Reports that she has an appetite but foods have no taste. Yesterday able to eat cereal for breakfast.  Lunch was [place] necks and greens.  Patient has been drinking mostly water  Monitoring blood glucose   Medications: reviewed  Labs: K 3.4, glucose 151  Anthropometrics:   Weight 174 lb 15 oz 177 lb on 10/15 179 lb on 7/15   NUTRITION DIAGNOSIS: Food and nutrition knowledge deficit improved   INTERVENTION:  Discussed strategies to help with taste change.  Recommend trying Metaqil rinse.   Handout given on Taste Change    MONITORING, EVALUATION, GOAL: weight trends, intake   NEXT VISIT: Wed, Nov 5 during infusion  [place] Oborn B. Dasie SOLON, CSO, LDN Registered Dietitian [phone]

## 2023-12-13 ENCOUNTER — Other Ambulatory Visit

## 2023-12-13 ENCOUNTER — Ambulatory Visit

## 2023-12-14 ENCOUNTER — Encounter: Payer: Self-pay | Admitting: Internal Medicine

## 2023-12-14 ENCOUNTER — Inpatient Hospital Stay

## 2023-12-14 ENCOUNTER — Inpatient Hospital Stay: Admitting: Nurse Practitioner

## 2023-12-14 ENCOUNTER — Encounter: Payer: Self-pay | Admitting: Nurse Practitioner

## 2023-12-14 ENCOUNTER — Telehealth: Payer: Self-pay

## 2023-12-14 VITALS — BP 117/73 | HR 85 | Temp 98.4°F | Resp 16 | Ht 65.0 in | Wt 176.7 lb

## 2023-12-14 VITALS — BP 133/83 | HR 90 | Temp 97.4°F | Resp 17

## 2023-12-14 DIAGNOSIS — C50411 Malignant neoplasm of upper-outer quadrant of right female breast: Secondary | ICD-10-CM

## 2023-12-14 DIAGNOSIS — E876 Hypokalemia: Secondary | ICD-10-CM | POA: Diagnosis not present

## 2023-12-14 DIAGNOSIS — G620 Drug-induced polyneuropathy: Secondary | ICD-10-CM | POA: Diagnosis not present

## 2023-12-14 DIAGNOSIS — Z170 Estrogen receptor positive status [ER+]: Secondary | ICD-10-CM

## 2023-12-14 DIAGNOSIS — T451X5A Adverse effect of antineoplastic and immunosuppressive drugs, initial encounter: Secondary | ICD-10-CM

## 2023-12-14 DIAGNOSIS — Z5111 Encounter for antineoplastic chemotherapy: Secondary | ICD-10-CM

## 2023-12-14 LAB — CBC WITH DIFFERENTIAL (CANCER CENTER ONLY)
Abs Immature Granulocytes: 0.06 K/uL (ref 0.00–0.07)
Basophils Absolute: 0 K/uL (ref 0.0–0.1)
Basophils Relative: 1 %
Eosinophils Absolute: 0 K/uL (ref 0.0–0.5)
Eosinophils Relative: 1 %
HCT: 25.8 % — ABNORMAL LOW (ref 36.0–46.0)
Hemoglobin: 9 g/dL — ABNORMAL LOW (ref 12.0–15.0)
Immature Granulocytes: 1 %
Lymphocytes Relative: 38 %
Lymphs Abs: 2 K/uL (ref 0.7–4.0)
MCH: 32.5 pg (ref 26.0–34.0)
MCHC: 34.9 g/dL (ref 30.0–36.0)
MCV: 93.1 fL (ref 80.0–100.0)
Monocytes Absolute: 0.4 K/uL (ref 0.1–1.0)
Monocytes Relative: 8 %
Neutro Abs: 2.8 K/uL (ref 1.7–7.7)
Neutrophils Relative %: 51 %
Platelet Count: 380 K/uL (ref 150–400)
RBC: 2.77 MIL/uL — ABNORMAL LOW (ref 3.87–5.11)
RDW: 15.7 % — ABNORMAL HIGH (ref 11.5–15.5)
WBC Count: 5.4 K/uL (ref 4.0–10.5)
nRBC: 2.4 % — ABNORMAL HIGH (ref 0.0–0.2)

## 2023-12-14 LAB — CMP (CANCER CENTER ONLY)
ALT: 16 U/L (ref 0–44)
AST: 26 U/L (ref 15–41)
Albumin: 3.4 g/dL — ABNORMAL LOW (ref 3.5–5.0)
Alkaline Phosphatase: 62 U/L (ref 38–126)
Anion gap: 10 (ref 5–15)
BUN: 11 mg/dL (ref 8–23)
CO2: 24 mmol/L (ref 22–32)
Calcium: 9.1 mg/dL (ref 8.9–10.3)
Chloride: 109 mmol/L (ref 98–111)
Creatinine: 0.62 mg/dL (ref 0.44–1.00)
GFR, Estimated: >60 mL/min (ref >60)
Glucose, Bld: 181 mg/dL — ABNORMAL HIGH (ref 70–99)
Potassium: 3.2 mmol/L — ABNORMAL LOW (ref 3.5–5.1)
Sodium: 143 mmol/L (ref 135–145)
Total Bilirubin: 0.5 mg/dL (ref 0.0–1.2)
Total Protein: 6.6 g/dL (ref 6.5–8.1)

## 2023-12-14 MED ORDER — DIPHENHYDRAMINE HCL 50 MG/ML IJ SOLN
50.0000 mg | Freq: Once | INTRAMUSCULAR | Status: AC
  Administered 2023-12-14: 50 mg via INTRAVENOUS
  Filled 2023-12-14: qty 1

## 2023-12-14 MED ORDER — FAMOTIDINE IN NACL 20-0.9 MG/50ML-% IV SOLN
20.0000 mg | Freq: Once | INTRAVENOUS | Status: AC
  Administered 2023-12-14: 20 mg via INTRAVENOUS
  Filled 2023-12-14: qty 50

## 2023-12-14 MED ORDER — POTASSIUM CHLORIDE 10 MEQ/100ML IV SOLN
10.0000 meq | Freq: Once | INTRAVENOUS | Status: AC
  Administered 2023-12-14: 10 meq via INTRAVENOUS
  Filled 2023-12-14: qty 100

## 2023-12-14 MED ORDER — SODIUM CHLORIDE 0.9 % IV SOLN
80.0000 mg/m2 | Freq: Once | INTRAVENOUS | Status: AC
  Administered 2023-12-14: 156 mg via INTRAVENOUS
  Filled 2023-12-14: qty 26

## 2023-12-14 MED ORDER — DEXAMETHASONE SOD PHOSPHATE PF 10 MG/ML IJ SOLN
10.0000 mg | Freq: Once | INTRAMUSCULAR | Status: AC
  Administered 2023-12-14: 10 mg via INTRAVENOUS

## 2023-12-14 MED ORDER — SODIUM CHLORIDE 0.9 % IV SOLN
INTRAVENOUS | Status: DC
  Filled 2023-12-14: qty 250

## 2023-12-14 NOTE — Progress Notes (Signed)
 Having mammo tomorrow. No concerns today.

## 2023-12-14 NOTE — Progress Notes (Signed)
 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   # JULY-AUG 2025- RIGHT BREAST T2; intramammary lymph node positive- N-1- ER-PR POSITIVE: her 2 neg; Ki-67-50%; right breast cancer with T2N1- ER/PR + her 2 NEG breast cancer.   # AUG 14th, 2025-   # Colonoscopy [Dr.sankar-Neg June 2017]  # Smoker; BRCA-1 positive   Primary cancer of right upper lobe of lung (HCC)  10/26/2015 Initial Diagnosis   Primary cancer of right upper lobe of lung (HCC)   Carcinoma of upper-outer quadrant of right breast in female, estrogen receptor positive (HCC)  09/12/2023 Initial Diagnosis   Carcinoma of upper-outer quadrant of right breast in female, estrogen receptor positive (HCC)   09/12/2023 Cancer Staging   Staging form: Breast, AJCC 8th Edition - Clinical: Stage IIA (cT2, cN1, cM0, G2, ER+, PR+, HER2-) - Signed by Rennie Cindy SAUNDERS, MD on 09/29/2023 Histologic grading system: 3 grade system   10/12/2023 -  Chemotherapy   Patient is on Treatment Plan : BREAST Paclitaxel  q7d / AC q21d       HISTORY OF PRESENTING ILLNESS: Patient ambulating-independently. Alone  Connie Osborne 63 y.o. female pleasant patient with remote history of stage I lung cancer s/p surgery, BRCA 1, right breast cancer T2 N1 ER/PR positive negative breast cancer returns to clinic for consideration of continuation of treatment.  Chemotherapy  was previously held due to UTI which is now resolved.  Diarrhea has improved since stopping metformin .  She complains of mild numbness and tingling, particularly of her fingertips, not limiting ADLs, and not bothersome.  She has not yet started cryotherapy.  Decrease in size of the breast mass.  No fevers or chills.  Using Imodium regularly.  No nausea or vomiting.  Endorses taste changes but weight is stable.  Otherwise denies complaints.  Review of Systems  Constitutional:  Positive for malaise/fatigue. Negative for chills, diaphoresis, fever and weight loss.  HENT:  Negative for nosebleeds and sore throat.   Eyes:  Negative for double vision.  Respiratory:  Negative for cough, hemoptysis, sputum production, shortness of breath and wheezing.   Cardiovascular:  Negative for chest pain, palpitations, orthopnea and leg swelling.  Gastrointestinal:  Negative for abdominal pain, blood in stool, constipation, diarrhea, heartburn, melena, nausea and vomiting.  Genitourinary:  Negative for dysuria, frequency and urgency.  Musculoskeletal:  Positive for back pain and joint pain. Negative for falls.  Skin:  Negative for itching and rash.  Neurological:  Positive for tingling. Negative for dizziness, focal weakness, weakness and headaches.  Endo/Heme/Allergies:  Does not bruise/bleed easily.  Psychiatric/Behavioral:  Negative for depression. The patient is not nervous/anxious and does not have insomnia.     MEDICAL HISTORY:  Past Medical History:  Diagnosis Date   Allergy    Anxiety    Arthritis    Breast cancer (HCC) 2014   Left- Radiation; BRCA 1 +    COPD (chronic obstructive pulmonary disease) (HCC)    Coughing up blood  GERD (gastroesophageal reflux disease)    Hypertension    Lung cancer (HCC)    Carcinoma, right upper lobe    Personal history of radiation therapy    Pre-diabetes    Shortness of breath dyspnea    Vitamin D deficiency     SURGICAL HISTORY: Past Surgical History:   Procedure Laterality Date   ABDOMINAL HYSTERECTOMY     BREAST BIOPSY Left 2014   +   BREAST BIOPSY Left 06/11/2019   stereo bx, x-clip, negative   BREAST BIOPSY Right 08/30/2023   US  RT BREAST BX W LOC DEV 1ST LESION IMG BX SPEC US  GUIDE 08/30/2023 [facility]   BREAST BIOPSY Right 08/30/2023   US  RT BREAST BX W LOC DEV EA ADD LESION IMG BX SPEC US  GUIDE 08/30/2023 [facility]   BREAST EXCISIONAL BIOPSY Left 2014   BREAST LUMPECTOMY Left 2014   BREAST SURGERY Left 2014   lumpectomy   COLONOSCOPY WITH PROPOFOL  N/A 08/18/2015   Procedure: COLONOSCOPY WITH PROPOFOL ;  Surgeon: Louanne KANDICE Muse, MD;  Location: [facility];  Service: Endoscopy;  Laterality: N/A;   COLONOSCOPY WITH PROPOFOL  N/A 10/12/2016   Procedure: COLONOSCOPY WITH PROPOFOL ;  Surgeon: Muse Louanne KANDICE, MD;  Location: [facility];  Service: Endoscopy;  Laterality: N/A;   ELECTROMAGNETIC NAVIGATION BROCHOSCOPY Right 07/28/2015   Procedure: ELECTROMAGNETIC NAVIGATION BRONCHOSCOPY;  Surgeon: Nickolas Cellar, MD;  Location: [facility];  Service: Cardiopulmonary;  Laterality: Right;   FOOT SURGERY     FRACTURE SURGERY     GANGLION CYST EXCISION     PORTACATH PLACEMENT N/A 10/02/2023   Procedure: INSERTION, TUNNELED CENTRAL VENOUS DEVICE, WITH PORT;  Surgeon: Rodolph Romano, MD;  Location: [facility];  Service: General;  Laterality: N/A;   THORACOTOMY/LOBECTOMY Right 09/14/2015   Procedure: THORACOTOMY/LOBECTOMY;  Surgeon: Louanne KANDICE Muse, MD;  Location: [facility];  Service: Thoracic;  Laterality: Right;    SOCIAL HISTORY: Social History   Socioeconomic History   Marital status: Single    Spouse name: Not on file   Number of children: Not on file   Years of education: Not on file   Highest education level: Not on file  Occupational History   Not on file  Tobacco Use   Smoking status: Former    Current packs/day: 0.00    Average packs/day: 1 pack/day for 30.0 years (30.0 ttl pk-yrs)    Types:  Cigarettes    Start date: 07/29/1985    Quit date: 07/30/2015    Years since quitting: 8.3   Smokeless tobacco: Never  Vaping Use   Vaping status: Never Used  Substance and Sexual Activity   Alcohol use: No    Alcohol/week: 0.0 standard drinks of alcohol   Drug use: No   Sexual activity: Not on file  Other Topics Concern   Not on file  Social History Narrative   Lives alone   Social Drivers of Health   Financial Resource Strain: Medium Risk (09/07/2023)   Received from [facility]   Overall Financial Resource Strain (CARDIA)    Difficulty of Paying Living Expenses: Somewhat hard  Food Insecurity: No Food Insecurity (09/12/2023)   Hunger Vital Sign    Worried About Running Out of Food in the Last Year: Never true    Ran Out of Food in the Last Year: Never true  Recent Concern: Food Insecurity - Food Insecurity Present (09/07/2023)   Received from [facility]   Hunger Vital Sign    Within the past 12 months, you  worried that your food would run out before you got the money to buy more.: Sometimes true    Within the past 12 months, the food you bought just didn't last and you didn't have money to get more.: Never true  Transportation Needs: No Transportation Needs (09/12/2023)   PRAPARE - Administrator, Civil Service (Medical): No    Lack of Transportation (Non-Medical): No  Physical Activity: Not on file  Stress: Not on file  Social Connections: Not on file  Intimate Partner Violence: Not At Risk (09/12/2023)   Humiliation, Afraid, Rape, and Kick questionnaire    Fear of Current or Ex-Partner: No    Emotionally Abused: No    Physically Abused: No    Sexually Abused: No    FAMILY HISTORY: Family History  Problem Relation Age of Onset   Hypertension Mother    Diabetes Mellitus II Mother    Cancer Mother        mets   Cancer Father        long cancer   Cancer Sister 51       breast   Breast cancer Sister 92   Cancer Sister  1       breast   Breast cancer Sister 37   Lung cancer Brother    Cancer Other        breast    ALLERGIES:  is allergic to accupril [quinapril hcl] and percocet [oxycodone -acetaminophen ].  MEDICATIONS:  Current Outpatient Medications  Medication Sig Dispense Refill   amLODipine  (NORVASC ) 10 MG tablet Take 10 mg by mouth daily with lunch.      Calcium Carb-Cholecalciferol (CALCIUM + VITAMIN D3 PO) Take 1 tablet by mouth in the morning and at bedtime.     cetirizine (ZYRTEC) 10 MG tablet Take 10 mg by mouth as needed for allergies.     diphenoxylate -atropine  (LOMOTIL ) 2.5-0.025 MG tablet Take 1 tablet by mouth 4 (four) times daily as needed for diarrhea or loose stools. Take it along with immodium 60 tablet 0   famotidine  (PEPCID ) 20 MG tablet Take 20 mg by mouth daily as needed for heartburn or indigestion.     glipiZIDE  (GLUCOTROL  XL) 5 MG 24 hr tablet Take 1 tablet (5 mg total) by mouth daily with breakfast. 30 tablet 6   lidocaine -prilocaine  (EMLA ) cream Apply on the port. 30 -45 min  prior to port access. 30 g 3   losartan  (COZAAR ) 100 MG tablet Take 100 mg by mouth daily with lunch.      metoprolol  succinate (TOPROL -XL) 25 MG 24 hr tablet Take 25 mg by mouth daily with lunch.      ondansetron  (ZOFRAN ) 8 MG tablet One pill every 8 hours as needed for nausea/vomitting. 40 tablet 1   Potassium Chloride  ER 20 MEQ TBCR Take 1 tablet (20 mEq total) by mouth 2 (two) times daily. 120 tablet 1   prochlorperazine  (COMPAZINE ) 10 MG tablet Take 1 tablet (10 mg total) by mouth every 6 (six) hours as needed for nausea or vomiting. 30 tablet 0   traMADol  (ULTRAM ) 50 MG tablet Take 1 tablet (50 mg total) by mouth every 6 (six) hours as needed. 10 tablet 0   No current facility-administered medications for this visit.    PHYSICAL EXAMINATION: Vitals:   12/14/23 0812  BP: 117/73  Pulse: 85  Resp: 16  Temp: 98.4 F (36.9 C)  SpO2: 99%   Filed Weights   12/14/23 0812  Weight: 176 lb 11.2  oz (80.2  kg)   Physical Exam Vitals reviewed.  Constitutional:      Appearance: She is not ill-appearing.  HENT:     Head: Normocephalic and atraumatic.  Eyes:     Extraocular Movements: Extraocular movements intact.     Pupils: Pupils are equal, round, and reactive to light.  Cardiovascular:     Rate and Rhythm: Normal rate and regular rhythm.  Pulmonary:     Comments: Decreased breath sounds bilaterally.  Chest:     Comments: 10/1-improvement of the right breast mass.  Vaguely felt Abdominal:     General: There is no distension.     Palpations: Abdomen is soft.  Musculoskeletal:        General: No deformity.  Skin:    General: Skin is warm.     Coloration: Skin is not pale.  Neurological:     Mental Status: She is alert and oriented to person, place, and time.  Psychiatric:        Mood and Affect: Mood normal.        Behavior: Behavior normal.    LABORATORY DATA:  I have reviewed the data as listed Lab Results  Component Value Date   WBC 5.4 12/14/2023   HGB 9.0 (L) 12/14/2023   HCT 25.8 (L) 12/14/2023   MCV 93.1 12/14/2023   PLT 380 12/14/2023   Recent Labs    11/29/23 0813 12/06/23 0942 12/14/23 0812  NA 141 138 143  K 3.6 3.4* 3.2*  CL 110 108 109  CO2 24 25 24   GLUCOSE 185* 151* 181*  BUN 12 19 11   CREATININE 0.67 0.56 0.62  CALCIUM 9.5 9.5 9.1  GFRNONAA >60 >60 >60  PROT 6.5 6.4* 6.6  ALBUMIN 3.6 3.5 3.4*  AST 26 19 26   ALT 17 19 16   ALKPHOS 65 59 62  BILITOT 0.7 0.4 0.5   RADIOGRAPHIC STUDIES: I have personally reviewed the radiological images as listed and agreed with the findings in the report. No results found.   Assessment & Plan:   Carcinoma of upper-outer quadrant of right breast in female, estrogen receptor positive # RIGHT breast- INVASIVE DUCTAL CARCINOMA WITH PAPILLARY FEATURES- T2 [3.7cm] & positive IMC intramammary lymph node-  cN0-positive for LVI ; grade 2 -ER 95% PR 80% HER2/neu 1+/negative. Ki-67 50%- ONE LYMPH NODE,  POSITIVE FOR METASTATIC CARCINOMA (1/1); METASTATIC FOCUS: 4 MM; SUSPICIOUS FOR EXTRANODAL EXTENSION Dr.Cintron- ONCOTYPE- 28. Discussed the role of endocrine therapy-given ER/PR positive disease postsurgery; antihormone pill 1 a day for 10 years;  adjuvant Zometa. Also consider CDK- inhibitors/ PARP inhibitors post surgery. Given multifocality-large size of the tumor; given BRCA 1 positive- PLAN with mastectomy with contralateral prophylactic mastectomy. AUG 18th, 2025- [facility] scan-ejection fraction of 56%.# ON neo-adjuvant chemo- taxol  weekly- with carbo q 3W; and followed by  Adriamycin-Cytoxan every 3  weeks x 4 cycles.   # Clinical response noted after 2 cycles of chemotherapy.  Will plan to do an ultrasound/mammogram after cycle #3. Scheduled for tomorrow.    # CURRENTLY ON neo-adjuvant chemo- taxol  weekly- with carbo q 3W; Labs today reviewed and acceptable for treatment. Proceed with [id number] today.    # UTI- [as per PCP]- s/p  on macrobid started 9/23 x5 days- resolved.    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 Recommend getting- has not yet started cryotherapy. Discussed today.    # hypokalemia-]likely from diarrhea].-continue  K-Dur twice a day --  stable.   # diarrhea - G1-2- from chemo- stopped metformin  d/t diarrhea. Started glipizide . IMPROVED w/ imodium prn and lomotil .       .  # GERD: on PPI- stable.    # HTN: stable.     # Hx of Borderline DM-  s/p nutrition- STOPPED  metformin  1000 mg ER/ [sec to Diarrhea]- add glipizide  5 mg XL /day- stable.   # BRCA-positive- prophylactic contralateral mastectomy. Consider bilateral prophylactic oophorectomy down the line.   # Social- FMLA/disability-     Weekly- labs;chemo- D8- NO MD- # add 10 kcl to weekly chemo appts.    PS-   # DISPOSITION: Chemo today & 10 meq KCl IV F/u per IS - add +/- KCl with each chemo- la  No problem-specific Assessment & Plan  notes found for this encounter.   Tinnie KANDICE Dawn, NP 12/14/2023

## 2023-12-14 NOTE — Telephone Encounter (Signed)
 Research nurse called patient to follow up on her interest in participating in the protocol after review of the protocol. Patient states she still wants to participate in the study. She volunteered to come to the cancer center tomorrow after her mammogram around 1130 am to go over the consent again for the study and get started. Patient was thanked for her willingness to participate and appointment requested for 01/15/24 at 1130 am per patient request.  Reena Romans, RN 12/14/23 4:15 PM

## 2023-12-15 ENCOUNTER — Ambulatory Visit
Admission: RE | Admit: 2023-12-15 | Discharge: 2023-12-15 | Disposition: A | Discharge status: Home / Self Care | Source: Ambulatory Visit | Attending: Internal Medicine | Admitting: Internal Medicine

## 2023-12-15 ENCOUNTER — Inpatient Hospital Stay

## 2023-12-15 ENCOUNTER — Encounter: Payer: Self-pay | Admitting: Internal Medicine

## 2023-12-15 ENCOUNTER — Ambulatory Visit
Admission: RE | Admit: 2023-12-15 | Discharge: 2023-12-15 | Disposition: A | Source: Ambulatory Visit | Attending: Internal Medicine

## 2023-12-15 DIAGNOSIS — C50411 Malignant neoplasm of upper-outer quadrant of right female breast: Secondary | ICD-10-CM

## 2023-12-15 DIAGNOSIS — Z170 Estrogen receptor positive status [ER+]: Secondary | ICD-10-CM | POA: Insufficient documentation

## 2023-12-15 NOTE — Research (Signed)
 Trial Name:  [id number]- Effectiveness of Out-of Pocket Cost Communication and Financial Navigation (CostCOM) in Cancer Patients:    Patient Connie Osborne was identified by this nurse as a potential candidate for the above listed study.  This Clinical Research Nurse met with [name], [id number] on 12/15/23 in a manner and location that ensures patient privacy to discuss participation in the above listed research study.  Patient is Unaccompanied.  Patient was previously provided with informed consent documents.  Patient confirmed they have read the informed consent documents.  As outlined in the informed consent form, this Nurse and [name] discussed the purpose of the research study, the investigational nature of the study, study procedures and requirements for study participation, potential risks and benefits of study participation, as well as alternatives to participation.  This study is not blinded or double-blinded. The patient understands participation is voluntary and they may withdraw from study participation at any time.  Each study arm was reviewed, and randomization discussed.  This study does not involve an investigational drug or device. This study does not involve a placebo. Patient understands enrollment is pending full eligibility review.   Confidentiality and how the patient's information will be used as part of study participation were discussed.  Patient was informed there is not reimbursement provided for their time and effort spent on trial participation.  The patient is encouraged to discuss research study participation with their insurance provider to determine what costs they may incur as part of study participation, including research related injury.    All questions were answered to patient's satisfaction.  The informed consent and separate HIPAA Authorization was reviewed page by page.  The patient's mental and emotional status is appropriate to provide  informed consent, and the patient verbalizes an understanding of study participation.  Patient has agreed to participate in the above listed research study and has voluntarily signed the informed consent version dated 09/25/2023 and separate HIPAA Authorization, version dated 11/15/2023 on 12/15/23 at 1241PM.  The patient was provided with a copy of the signed informed consent form and separate HIPAA Authorization for their reference.  No study specific procedures were obtained prior to the signing of the informed consent document.  Approximately 30 minutes were spent with the patient reviewing the informed consent documents.  After obtaining informed consent patient, voluntarily signed the optional Release of Information form for use throughout trial participation. Research nurse will review eligibility criteria with another nurse prior to registration to Step 0 for the study. Patient is agreeable to meeting in clinic for her baseline visit. Reena Romans, RN 12/15/23 1:11 PM

## 2023-12-18 ENCOUNTER — Encounter: Payer: Self-pay | Admitting: *Deleted

## 2023-12-18 ENCOUNTER — Encounter: Payer: Self-pay | Admitting: Internal Medicine

## 2023-12-18 DIAGNOSIS — Z170 Estrogen receptor positive status [ER+]: Secondary | ICD-10-CM

## 2023-12-18 NOTE — Research (Signed)
 Effectiveness of Out-of-Pocket Psychologist, forensic (CostCOM) in Cancer Patients    This Nurse has reviewed this patient's inclusion and exclusion criteria as a second review and confirms Connie Osborne is eligible for study participation.  Patient may continue with enrollment.  Mazie Larsen, RN, BSN Clinical Research Nurse [phone] 12/18/2023

## 2023-12-19 MED FILL — Fosaprepitant Dimeglumine For IV Infusion 150 MG (Base Eq): INTRAVENOUS | Qty: 5 | Status: AC

## 2023-12-20 ENCOUNTER — Inpatient Hospital Stay

## 2023-12-20 ENCOUNTER — Other Ambulatory Visit

## 2023-12-20 ENCOUNTER — Inpatient Hospital Stay: Admitting: Oncology

## 2023-12-20 ENCOUNTER — Encounter: Payer: Self-pay | Admitting: Oncology

## 2023-12-20 VITALS — BP 137/77 | HR 71

## 2023-12-20 VITALS — BP 127/80 | HR 68 | Temp 97.4°F | Resp 16 | Ht 65.0 in | Wt 174.4 lb

## 2023-12-20 DIAGNOSIS — Z170 Estrogen receptor positive status [ER+]: Secondary | ICD-10-CM | POA: Diagnosis not present

## 2023-12-20 DIAGNOSIS — C50411 Malignant neoplasm of upper-outer quadrant of right female breast: Secondary | ICD-10-CM

## 2023-12-20 DIAGNOSIS — D701 Agranulocytosis secondary to cancer chemotherapy: Secondary | ICD-10-CM | POA: Diagnosis not present

## 2023-12-20 DIAGNOSIS — Z5111 Encounter for antineoplastic chemotherapy: Secondary | ICD-10-CM

## 2023-12-20 DIAGNOSIS — T451X5A Adverse effect of antineoplastic and immunosuppressive drugs, initial encounter: Secondary | ICD-10-CM | POA: Diagnosis not present

## 2023-12-20 LAB — VITAMIN B12: Vitamin B-12: 301 pg/mL (ref 180–914)

## 2023-12-20 LAB — CBC WITH DIFFERENTIAL (CANCER CENTER ONLY)
Abs Immature Granulocytes: 0.01 K/uL (ref 0.00–0.07)
Basophils Absolute: 0 K/uL (ref 0.0–0.1)
Basophils Relative: 1 %
Eosinophils Absolute: 0 K/uL (ref 0.0–0.5)
Eosinophils Relative: 0 %
HCT: 25.6 % — ABNORMAL LOW (ref 36.0–46.0)
Hemoglobin: 8.8 g/dL — ABNORMAL LOW (ref 12.0–15.0)
Immature Granulocytes: 0 %
Lymphocytes Relative: 44 %
Lymphs Abs: 1.4 K/uL (ref 0.7–4.0)
MCH: 32.1 pg (ref 26.0–34.0)
MCHC: 34.4 g/dL (ref 30.0–36.0)
MCV: 93.4 fL (ref 80.0–100.0)
Monocytes Absolute: 0.2 K/uL (ref 0.1–1.0)
Monocytes Relative: 6 %
Neutro Abs: 1.6 K/uL — ABNORMAL LOW (ref 1.7–7.7)
Neutrophils Relative %: 49 %
Platelet Count: 266 K/uL (ref 150–400)
RBC: 2.74 MIL/uL — ABNORMAL LOW (ref 3.87–5.11)
RDW: 16.4 % — ABNORMAL HIGH (ref 11.5–15.5)
WBC Count: 3.3 K/uL — ABNORMAL LOW (ref 4.0–10.5)
nRBC: 0 % (ref 0.0–0.2)

## 2023-12-20 LAB — CMP (CANCER CENTER ONLY)
ALT: 25 U/L (ref 0–44)
AST: 22 U/L (ref 15–41)
Albumin: 3.5 g/dL (ref 3.5–5.0)
Alkaline Phosphatase: 55 U/L (ref 38–126)
Anion gap: 8 (ref 5–15)
BUN: 12 mg/dL (ref 8–23)
CO2: 24 mmol/L (ref 22–32)
Calcium: 9.3 mg/dL (ref 8.9–10.3)
Chloride: 107 mmol/L (ref 98–111)
Creatinine: 0.65 mg/dL (ref 0.44–1.00)
GFR, Estimated: >60 mL/min (ref >60)
Glucose, Bld: 158 mg/dL — ABNORMAL HIGH (ref 70–99)
Potassium: 3.4 mmol/L — ABNORMAL LOW (ref 3.5–5.1)
Sodium: 139 mmol/L (ref 135–145)
Total Bilirubin: 0.5 mg/dL (ref 0.0–1.2)
Total Protein: 6.3 g/dL — ABNORMAL LOW (ref 6.5–8.1)

## 2023-12-20 LAB — FERRITIN: Ferritin: 128 ng/mL (ref 11–307)

## 2023-12-20 LAB — IRON AND TIBC
Iron: 60 ug/dL (ref 28–170)
Saturation Ratios: 16 % (ref 10.4–31.8)
TIBC: 370 ug/dL (ref 250–450)
UIBC: 310 ug/dL

## 2023-12-20 LAB — FOLATE: Folate: 11.4 ng/mL (ref >5.9)

## 2023-12-20 MED ORDER — SODIUM CHLORIDE 0.9 % IV SOLN
150.0000 mg | Freq: Once | INTRAVENOUS | Status: AC
  Administered 2023-12-20: 150 mg via INTRAVENOUS
  Filled 2023-12-20: qty 150

## 2023-12-20 MED ORDER — PALONOSETRON HCL INJECTION 0.25 MG/5ML
0.2500 mg | Freq: Once | INTRAVENOUS | Status: AC
  Administered 2023-12-20: 0.25 mg via INTRAVENOUS
  Filled 2023-12-20: qty 5

## 2023-12-20 MED ORDER — SODIUM CHLORIDE 0.9 % IV SOLN
INTRAVENOUS | Status: DC
  Filled 2023-12-20 (×2): qty 250

## 2023-12-20 MED ORDER — DEXAMETHASONE SOD PHOSPHATE PF 10 MG/ML IJ SOLN
10.0000 mg | Freq: Once | INTRAMUSCULAR | Status: AC
  Administered 2023-12-20: 10 mg via INTRAVENOUS

## 2023-12-20 MED ORDER — DIPHENHYDRAMINE HCL 50 MG/ML IJ SOLN
50.0000 mg | Freq: Once | INTRAMUSCULAR | Status: AC
  Administered 2023-12-20: 50 mg via INTRAVENOUS
  Filled 2023-12-20: qty 1

## 2023-12-20 MED ORDER — SODIUM CHLORIDE 0.9 % IV SOLN
80.0000 mg/m2 | Freq: Once | INTRAVENOUS | Status: AC
  Administered 2023-12-20: 156 mg via INTRAVENOUS
  Filled 2023-12-20: qty 26

## 2023-12-20 MED ORDER — FAMOTIDINE IN NACL 20-0.9 MG/50ML-% IV SOLN
20.0000 mg | Freq: Once | INTRAVENOUS | Status: AC
  Administered 2023-12-20: 20 mg via INTRAVENOUS
  Filled 2023-12-20: qty 50

## 2023-12-20 MED ORDER — SODIUM CHLORIDE 0.9 % IV SOLN
576.5000 mg | Freq: Once | INTRAVENOUS | Status: AC
  Administered 2023-12-20: 580 mg via INTRAVENOUS
  Filled 2023-12-20: qty 58

## 2023-12-20 NOTE — Research (Addendum)
 Effectiveness of Out-of-Pocket Psychologist, forensic (CostCOM) in Cancer Patients   Baseline Visit:  Patient in the infusion room currently after her follow up with Dr. Melanee this morning. Dr. Melanee reviewed her eligibility checklist and completed with her signature. Patient was provided the patient contact form and the Baseline Questionnaire for the protocol to complete while she is getting her treatment today. She states she is doing well, there have been no changes in her history from the other day. She was instructed to take her time answering the questionnaire, research nurse will check with her in a little while to ensure she doesn't have any problems or questions.  Reena Romans, RN 12/20/23 10:25 AM  Patient completed her baseline questionnaires without any difficulties unassisted. She wants to have them mailed to her house, she says it's too hard to answer questions on her phone and she doesn't have a laptop. Patient completed the contact form and chose to have all gift cards be for [employer]. Research nurse will register / randomize patient to the study tomorrow and notify the patient of the arm she receives at that time. Research nurse will then be able to provide her with a research schedule for her visits.  Patient verbalizes understanding of all plans discussed today. Patient knows to call the research office if she has any questions or concerns.  Reena Romans, RN 12/20/23 3:25 PM

## 2023-12-20 NOTE — Progress Notes (Signed)
 Hematology/Oncology Consult note [facility]  [phone] [phone]  Patient Care Team: Jacques Garre, NP as PCP - General (Nurse Practitioner) Dellie Louanne MATSU, MD (General Surgery) Nancylee Duel, MD (Inactive) (Hematology and Oncology) Rennie Cindy SAUNDERS, MD as Consulting Physician (Oncology) Georgina Shasta POUR, RN as Oncology Nurse Navigator   Name of the patient: Connie Osborne  [id number]  [birth year]   Date of visit: 12/20/23  Diagnosis-  Cancer Staging  Carcinoma of upper-outer quadrant of right breast in female, estrogen receptor positive (HCC) Staging form: Breast, AJCC 8th Edition - Clinical: Stage IIA (cT2, cN1, cM0, G2, ER+, PR+, HER2-) - Signed by Rennie Cindy SAUNDERS, MD on 09/29/2023 Histologic grading system: 3 grade system    Chief complaint/ Reason for visit-on treatment assessment prior to cycle 4 of neoadjuvant CarboTaxol chemotherapy  Heme/Onc history:  Oncology History Overview Note  # 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   # JULY-AUG 2025- RIGHT BREAST T2; intramammary lymph node positive- N-1- ER-PR POSITIVE: her 2 neg; Ki-67-50%; right breast cancer with T2N1- ER/PR + her 2 NEG breast cancer.   # AUG 14th, 2025-   # Colonoscopy [Dr.sankar-Neg June 2017]  # Smoker; BRCA-1 positive   Primary cancer of right upper lobe of lung (HCC)  10/26/2015 Initial Diagnosis   Primary cancer of right upper lobe of lung (HCC)   Carcinoma of upper-outer quadrant of right breast in female, estrogen receptor positive (HCC)  09/12/2023 Initial Diagnosis   Carcinoma of upper-outer quadrant of right breast in female, estrogen receptor positive (HCC)   09/12/2023 Cancer Staging   Staging form: Breast, AJCC 8th Edition - Clinical: Stage  IIA (cT2, cN1, cM0, G2, ER+, PR+, HER2-) - Signed by Rennie Cindy SAUNDERS, MD on 09/29/2023 Histologic grading system: 3 grade system   10/12/2023 -  Chemotherapy   Patient is on Treatment Plan : BREAST Paclitaxel  q7d / AC q21d        Interval history-  She experiences soreness in her fingers but no worsening symptoms. Her feet feel fine. She has diarrhea, which she associates with her chemotherapy and metformin  use. She takes metformin  500 mg twice daily for diabetes. Her potassium levels were low last week, requiring supplementation, and she continues to take potassium tablets.   Her potassium is slightly low today at 3.4 mmol/L. Her white blood cell count is also slightly low, but she has not received any white blood cell boosting injections yet.   ECOG PS- 1 Pain scale- 0   Review of systems- Review of Systems  Constitutional:  Positive for malaise/fatigue. Negative for chills, fever and weight loss.  HENT:  Negative for congestion, ear discharge and nosebleeds.   Eyes:  Negative for blurred vision.  Respiratory:  Negative for cough, hemoptysis, sputum production, shortness of breath and wheezing.   Cardiovascular:  Negative for chest pain, palpitations, orthopnea and claudication.  Gastrointestinal:  Negative for abdominal pain, blood in stool, constipation, diarrhea, heartburn, melena, nausea and vomiting.  Genitourinary:  Negative for dysuria, flank pain, frequency, hematuria and urgency.  Musculoskeletal:  Negative for back pain, joint pain and myalgias.  Skin:  Negative for rash.  Neurological:  Negative for dizziness, tingling, focal weakness, seizures, weakness and headaches.  Endo/Heme/Allergies:  Does not bruise/bleed easily.  Psychiatric/Behavioral:  Negative for depression and suicidal  ideas. The patient does not have insomnia.       Allergies  Allergen Reactions   Accupril [Quinapril Hcl] Cough   Percocet [Oxycodone -Acetaminophen ] Other (See Comments)    Reaction:  Unknown     Past Medical History:  Diagnosis Date   Allergy    Anxiety    Arthritis    Breast cancer (HCC) 2014   Left- Radiation; BRCA 1 +    COPD (chronic obstructive pulmonary disease) (HCC)    Coughing up blood    GERD (gastroesophageal reflux disease)    Hypertension    Lung cancer (HCC)    Carcinoma, right upper lobe    Personal history of radiation therapy    Pre-diabetes    Shortness of breath dyspnea    Vitamin D deficiency      Past Surgical History:  Procedure Laterality Date   ABDOMINAL HYSTERECTOMY     BREAST BIOPSY Left 2014   +   BREAST BIOPSY Left 06/11/2019   stereo bx, x-clip, negative   BREAST BIOPSY Right 08/30/2023   US  RT BREAST BX W LOC DEV 1ST LESION IMG BX SPEC US  GUIDE 08/30/2023 [facility]   BREAST BIOPSY Right 08/30/2023   US  RT BREAST BX W LOC DEV EA ADD LESION IMG BX SPEC US  GUIDE 08/30/2023 [facility]   BREAST EXCISIONAL BIOPSY Left 2014   BREAST LUMPECTOMY Left 2014   BREAST SURGERY Left 2014   lumpectomy   COLONOSCOPY WITH PROPOFOL  N/A 08/18/2015   Procedure: COLONOSCOPY WITH PROPOFOL ;  Surgeon: Louanne KANDICE Muse, MD;  Location: [facility];  Service: Endoscopy;  Laterality: N/A;   COLONOSCOPY WITH PROPOFOL  N/A 10/12/2016   Procedure: COLONOSCOPY WITH PROPOFOL ;  Surgeon: Muse Louanne KANDICE, MD;  Location: [facility];  Service: Endoscopy;  Laterality: N/A;   ELECTROMAGNETIC NAVIGATION BROCHOSCOPY Right 07/28/2015   Procedure: ELECTROMAGNETIC NAVIGATION BRONCHOSCOPY;  Surgeon: Nickolas Cellar, MD;  Location: [facility];  Service: Cardiopulmonary;  Laterality: Right;   FOOT SURGERY     FRACTURE SURGERY     GANGLION CYST EXCISION     PORTACATH PLACEMENT N/A 10/02/2023   Procedure: INSERTION, TUNNELED CENTRAL VENOUS DEVICE, WITH PORT;  Surgeon: Rodolph Romano, MD;  Location: [facility];  Service: General;  Laterality: N/A;   THORACOTOMY/LOBECTOMY Right 09/14/2015   Procedure: THORACOTOMY/LOBECTOMY;  Surgeon: Louanne KANDICE Muse, MD;  Location: [facility];  Service: Thoracic;  Laterality: Right;    Social History   Socioeconomic History   Marital status: Single    Spouse name: Not on file   Number of children: Not on file   Years of education: Not on file   Highest education level: Not on file  Occupational History   Not on file  Tobacco Use   Smoking status: Former    Current packs/day: 0.00    Average packs/day: 1 pack/day for 30.0 years (30.0 ttl pk-yrs)    Types: Cigarettes    Start date: 07/29/1985    Quit date: 07/30/2015    Years since quitting: 8.3   Smokeless tobacco: Never  Vaping Use   Vaping status: Never Used  Substance and Sexual Activity   Alcohol use: No    Alcohol/week: 0.0 standard drinks of alcohol   Drug use: No   Sexual activity: Not on file  Other Topics Concern   Not on file  Social History Narrative   Lives alone   Social Drivers of Health   Financial Resource Strain: Medium Risk (09/07/2023)   Received from [facility]   Overall Financial  Resource Strain (CARDIA)    Difficulty of Paying Living Expenses: Somewhat hard  Food Insecurity: No Food Insecurity (09/12/2023)   Hunger Vital Sign    Worried About Running Out of Food in the Last Year: Never true    Ran Out of Food in the Last Year: Never true  Recent Concern: Food Insecurity - Food Insecurity Present (09/07/2023)   Received from [facility]   Hunger Vital Sign    Within the past 12 months, you worried that your food would run out before you got the money to buy more.: Sometimes true    Within the past 12 months, the food you bought just didn't last and you didn't have money to get more.: Never true  Transportation Needs: No Transportation Needs (09/12/2023)   PRAPARE - Administrator, Civil Service (Medical): No    Lack of Transportation (Non-Medical): No  Physical Activity: Not on file  Stress: Not on file  Social Connections: Not on file  Intimate Partner  Violence: Not At Risk (09/12/2023)   Humiliation, Afraid, Rape, and Kick questionnaire    Fear of Current or Ex-Partner: No    Emotionally Abused: No    Physically Abused: No    Sexually Abused: No    Family History  Problem Relation Age of Onset   Hypertension Mother    Diabetes Mellitus II Mother    Cancer Mother        mets   Cancer Father        long cancer   Cancer Sister 33       breast   Breast cancer Sister 62   Cancer Sister 39       breast   Breast cancer Sister 17   Lung cancer Brother    Cancer Other        breast     Current Outpatient Medications:    amLODipine  (NORVASC ) 10 MG tablet, Take 10 mg by mouth daily with lunch. , Disp: , Rfl:    Calcium Carb-Cholecalciferol (CALCIUM + VITAMIN D3 PO), Take 1 tablet by mouth in the morning and at bedtime., Disp: , Rfl:    cetirizine (ZYRTEC) 10 MG tablet, Take 10 mg by mouth as needed for allergies., Disp: , Rfl:    diphenoxylate -atropine  (LOMOTIL ) 2.5-0.025 MG tablet, Take 1 tablet by mouth 4 (four) times daily as needed for diarrhea or loose stools. Take it along with immodium, Disp: 60 tablet, Rfl: 0   famotidine  (PEPCID ) 20 MG tablet, Take 20 mg by mouth daily as needed for heartburn or indigestion., Disp: , Rfl:    glipiZIDE  (GLUCOTROL  XL) 5 MG 24 hr tablet, Take 1 tablet (5 mg total) by mouth daily with breakfast., Disp: 30 tablet, Rfl: 6   lidocaine -prilocaine  (EMLA ) cream, Apply on the port. 30 -45 min  prior to port access., Disp: 30 g, Rfl: 3   losartan  (COZAAR ) 100 MG tablet, Take 100 mg by mouth daily with lunch. , Disp: , Rfl:    metFORMIN  (GLUCOPHAGE ) 500 MG tablet, Take 500 mg by mouth 2 (two) times daily with a meal., Disp: , Rfl:    metoprolol  succinate (TOPROL -XL) 25 MG 24 hr tablet, Take 25 mg by mouth daily with lunch. , Disp: , Rfl:    ondansetron  (ZOFRAN ) 8 MG tablet, One pill every 8 hours as needed for nausea/vomitting., Disp: 40 tablet, Rfl: 1   Potassium Chloride  ER 20 MEQ TBCR, Take 1 tablet (20  mEq total) by mouth 2 (two)  times daily., Disp: 120 tablet, Rfl: 1   prochlorperazine  (COMPAZINE ) 10 MG tablet, Take 1 tablet (10 mg total) by mouth every 6 (six) hours as needed for nausea or vomiting., Disp: 30 tablet, Rfl: 0   traMADol  (ULTRAM ) 50 MG tablet, Take 1 tablet (50 mg total) by mouth every 6 (six) hours as needed., Disp: 10 tablet, Rfl: 0 No current facility-administered medications for this visit.  Facility-Administered Medications Ordered in Other Visits:    0.9 %  sodium chloride  infusion, , Intravenous, Continuous, Brahmanday, Govinda R, MD, Last Rate: 5 mL/hr at 12/20/23 1120, Infusion Verify at 12/20/23 1120   CARBOplatin  (PARAPLATIN ) 580 mg in sodium chloride  0.9 % 250 mL chemo infusion, 580 mg, Intravenous, Once, Brahmanday, Govinda R, MD  Physical exam:  Vitals:   12/20/23 0908  BP: 127/80  Pulse: 68  Resp: 16  Temp: (!) 97.4 F (36.3 C)  TempSrc: Tympanic  SpO2: 99%  Weight: 174 lb 6.4 oz (79.1 kg)  Height: 5' 5 (1.651 m)   Physical Exam Cardiovascular:     Rate and Rhythm: Normal rate and regular rhythm.     Heart sounds: Normal heart sounds.  Pulmonary:     Effort: Pulmonary effort is normal.     Breath sounds: Normal breath sounds.  Skin:    General: Skin is warm and dry.  Neurological:     Mental Status: She is alert and oriented to person, place, and time.      I have personally reviewed labs listed below:    Latest Ref Rng & Units 12/20/2023    8:48 AM  CMP  Glucose 70 - 99 mg/dL 841   BUN 8 - 23 mg/dL 12   Creatinine 9.55 - 1.00 mg/dL 9.34   Sodium 864 - 854 mmol/L 139   Potassium 3.5 - 5.1 mmol/L 3.4   Chloride 98 - 111 mmol/L 107   CO2 22 - 32 mmol/L 24   Calcium 8.9 - 10.3 mg/dL 9.3   Total Protein 6.5 - 8.1 g/dL 6.3   Total Bilirubin 0.0 - 1.2 mg/dL 0.5   Alkaline Phos 38 - 126 U/L 55   AST 15 - 41 U/L 22   ALT 0 - 44 U/L 25       Latest Ref Rng & Units 12/20/2023    8:48 AM  CBC  WBC 4.0 - 10.5 K/uL 3.3   Hemoglobin 12.0  - 15.0 g/dL 8.8   Hematocrit 63.9 - 46.0 % 25.6   Platelets 150 - 400 K/uL 266    I have personally reviewed Radiology images listed below: No images are attached to the encounter.  MM DIAG BREAST TOMO UNI RIGHT Result Date: 12/15/2023 CLINICAL DATA:  Tumor check status post neoadjuvant chemotherapy. History of RIGHT breast invasive ductal carcinoma segments September 05, 2023. Patient underwent ultrasound-guided biopsy of a RIGHT breast mass at 1 o'clock (RIBBON clip) as well as an intramammary lymph node (COIL clip) which demonstrated invasive ductal carcinoma. Staging MRI demonstrated an enlarged level 1 RIGHT axillary lymph node it was subsequently biopsy and demonstrated metastatic disease (HYDROMARK). History of a LEFT breast cancer in 2014 with a benign stereotactic guided biopsy in 2021 of calcifications. EXAM: DIGITAL DIAGNOSTIC UNILATERAL RIGHT MAMMOGRAM WITH TOMOSYNTHESIS AND CAD; ULTRASOUND RIGHT BREAST LIMITED TECHNIQUE: Right digital diagnostic mammography and breast tomosynthesis was performed. The images were evaluated with computer-aided detection. ; Targeted ultrasound examination of the right breast was performed COMPARISON:  Previous exam(s). ACR Breast Density Category b: There are scattered  areas of fibroglandular density. FINDINGS: Diagnostic images of the RIGHT breast demonstrate an irregular mass with associated RIBBON shaped biopsy marking clip in the RIGHT upper breast at posterior depth. Spot magnification views demonstrate increased anteriorly extending calcifications compared to prior. They span at least 15 mm in anterior-posterior extent, previously approximately 5 mm. These could only be assessed on MLO view due to positioning and patient tolerance. Mass itself is decreased in size compared to prior. Decreased prominence of an intramammary lymph node is noted in the RIGHT upper outer breast with associated COIL shaped biopsy marking clip. A second non sampled adjacent intramammary  lymph node has also decreased in size compared to prior. Two additional RIGHT axillary lymph nodes seen on MLO view slice 74 have decreased since prior as well. HYDROMARK clip is noted in the RIGHT axilla in association with a minimally decreased size of a RIGHT axillary lymph node. Targeted ultrasound was performed of the RIGHT breast. At 1 o'clock 12 cm from nipple there is revisualization of an irregular hypoechoic mass. It measures 22 x 24 x 15 mm, previously 37 x 30 x 34 mm. It demonstrates an associated echogenic focus consistent with biopsy marking clip. Targeted ultrasound was performed of the RIGHT upper outer breast. Intramammary lymph nodes have decreased in size compared to prior. They demonstrate preserved echogenic hila and thin smooth cortices measuring approximately 1 mm in thickness, previously 3 mm. Targeted ultrasound was performed of the RIGHT axilla. Previously sampled level 1 RIGHT axillary lymph node now demonstrates normal morphology with a thin smooth hilum, thinner compared to prior. HYDROMARK biopsy marking clip is noted to be immediately adjacent to the axillary lymph node. IMPRESSION: 1. Decrease in size of a biopsy-proven RIGHT breast invasive ductal carcinoma from 37 mm to 24 mm. Increased adjacent calcifications likely reflect the sequela of treatment response. If breast conservation surgery is pursued, recommend bracketed excision of adjacent calcifications. 2. Decrease in size of multiple intramammary lymph nodes, consistent with treatment response. They are now morphologically normal in appearance 3. Decrease in size of the biopsy-proven metastatic RIGHT axillary lymph node. It now morphologically normal in appearance. Of note, HYDROMARK biopsy marking clip is immediately adjacent to the RIGHT axillary lymph node. RECOMMENDATION: Recommend continued clinical management of biopsy proven malignancy I have discussed the findings and recommendations with the patient. If applicable, a  reminder letter will be sent to the patient regarding the next appointment. BI-RADS CATEGORY  6: Known biopsy-proven malignancy. Electronically Signed   By: Corean Salter M.D.   On: 12/15/2023 12:57   US  Breast Limited Uni Right Inc Axilla Result Date: 12/15/2023 CLINICAL DATA:  Tumor check status post neoadjuvant chemotherapy. History of RIGHT breast invasive ductal carcinoma segments September 05, 2023. Patient underwent ultrasound-guided biopsy of a RIGHT breast mass at 1 o'clock (RIBBON clip) as well as an intramammary lymph node (COIL clip) which demonstrated invasive ductal carcinoma. Staging MRI demonstrated an enlarged level 1 RIGHT axillary lymph node it was subsequently biopsy and demonstrated metastatic disease (HYDROMARK). History of a LEFT breast cancer in 2014 with a benign stereotactic guided biopsy in 2021 of calcifications. EXAM: DIGITAL DIAGNOSTIC UNILATERAL RIGHT MAMMOGRAM WITH TOMOSYNTHESIS AND CAD; ULTRASOUND RIGHT BREAST LIMITED TECHNIQUE: Right digital diagnostic mammography and breast tomosynthesis was performed. The images were evaluated with computer-aided detection. ; Targeted ultrasound examination of the right breast was performed COMPARISON:  Previous exam(s). ACR Breast Density Category b: There are scattered areas of fibroglandular density. FINDINGS: Diagnostic images of the RIGHT breast demonstrate an  irregular mass with associated RIBBON shaped biopsy marking clip in the RIGHT upper breast at posterior depth. Spot magnification views demonstrate increased anteriorly extending calcifications compared to prior. They span at least 15 mm in anterior-posterior extent, previously approximately 5 mm. These could only be assessed on MLO view due to positioning and patient tolerance. Mass itself is decreased in size compared to prior. Decreased prominence of an intramammary lymph node is noted in the RIGHT upper outer breast with associated COIL shaped biopsy marking clip. A second non  sampled adjacent intramammary lymph node has also decreased in size compared to prior. Two additional RIGHT axillary lymph nodes seen on MLO view slice 74 have decreased since prior as well. HYDROMARK clip is noted in the RIGHT axilla in association with a minimally decreased size of a RIGHT axillary lymph node. Targeted ultrasound was performed of the RIGHT breast. At 1 o'clock 12 cm from nipple there is revisualization of an irregular hypoechoic mass. It measures 22 x 24 x 15 mm, previously 37 x 30 x 34 mm. It demonstrates an associated echogenic focus consistent with biopsy marking clip. Targeted ultrasound was performed of the RIGHT upper outer breast. Intramammary lymph nodes have decreased in size compared to prior. They demonstrate preserved echogenic hila and thin smooth cortices measuring approximately 1 mm in thickness, previously 3 mm. Targeted ultrasound was performed of the RIGHT axilla. Previously sampled level 1 RIGHT axillary lymph node now demonstrates normal morphology with a thin smooth hilum, thinner compared to prior. HYDROMARK biopsy marking clip is noted to be immediately adjacent to the axillary lymph node. IMPRESSION: 1. Decrease in size of a biopsy-proven RIGHT breast invasive ductal carcinoma from 37 mm to 24 mm. Increased adjacent calcifications likely reflect the sequela of treatment response. If breast conservation surgery is pursued, recommend bracketed excision of adjacent calcifications. 2. Decrease in size of multiple intramammary lymph nodes, consistent with treatment response. They are now morphologically normal in appearance 3. Decrease in size of the biopsy-proven metastatic RIGHT axillary lymph node. It now morphologically normal in appearance. Of note, HYDROMARK biopsy marking clip is immediately adjacent to the RIGHT axillary lymph node. RECOMMENDATION: Recommend continued clinical management of biopsy proven malignancy I have discussed the findings and recommendations with  the patient. If applicable, a reminder letter will be sent to the patient regarding the next appointment. BI-RADS CATEGORY  6: Known biopsy-proven malignancy. Electronically Signed   By: Corean Salter M.D.   On: 12/15/2023 12:57     Assessment and plan- Patient is a 63 y.o. 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  Discussed results of interim ultrasound with the patient in detail.  Tumor and lymph node size reduced. No new significant side effects. - Administer carboplatin  and Taxol  today as part of the carbotaxol regimen. - Continue weekly Taxol  for two more weeks. - Plan for Adriamycin and Cytoxan treatment after completion of current regimen. - Schedule follow-up with Dr. B in two weeks.  Chemotherapy-induced peripheral neuropathy Peripheral neuropathy presenting as soreness in fingers, likely secondary to Taxol . Symptoms not worsening.  Continue to monitor off any medication - Consider dose reduction of Taxol  if symptoms worsen.  Chemotherapy-induced diarrhea Intermittent diarrhea, possibly exacerbated by metformin  use. Contributes  to hypokalemia.  Hypokalemia secondary to chemotherapy-induced diarrhea Mild hypokalemia with potassium level at 3.4 mmol/L, likely due to diarrhea. Managed with potassium supplementation. - Continue current potassium supplementation.  Leukopenia secondary to chemotherapy Mild leukopenia observed, not severe enough to delay chemotherapy.  ANC 1.6 today.  No prior use of white blood cell count boosting agents. - Administer Neupogen injections for three days starting tomorrow to boost white blood cell count.  Type 2 diabetes  mellitus Managed with metformin  500 mg twice daily. Possible contribution to diarrhea.         Visit Diagnosis 1. Carcinoma of upper-outer quadrant of right breast in female, estrogen receptor positive (HCC)   2. Chemotherapy induced neutropenia   3. Encounter for antineoplastic chemotherapy      Dr. Annah Skene, MD, MPH [facility] [phone] 12/20/2023 12:50 PM

## 2023-12-20 NOTE — Progress Notes (Signed)
 She feels like she may be getting a cold, nose bleeds, sinus congestion, cough x2-3 days, no fever.

## 2023-12-20 NOTE — Patient Instructions (Signed)

## 2023-12-21 ENCOUNTER — Inpatient Hospital Stay

## 2023-12-21 ENCOUNTER — Encounter: Payer: Self-pay | Admitting: Internal Medicine

## 2023-12-21 DIAGNOSIS — E876 Hypokalemia: Secondary | ICD-10-CM

## 2023-12-21 DIAGNOSIS — Z5111 Encounter for antineoplastic chemotherapy: Secondary | ICD-10-CM | POA: Diagnosis not present

## 2023-12-21 MED ORDER — FILGRASTIM-SNDZ 480 MCG/0.8ML IJ SOSY
480.0000 ug | PREFILLED_SYRINGE | Freq: Every day | INTRAMUSCULAR | Status: DC
  Administered 2023-12-21: 480 ug via SUBCUTANEOUS
  Filled 2023-12-21: qty 0.8

## 2023-12-22 ENCOUNTER — Encounter: Payer: Self-pay | Admitting: Internal Medicine

## 2023-12-22 ENCOUNTER — Inpatient Hospital Stay

## 2023-12-22 DIAGNOSIS — C50411 Malignant neoplasm of upper-outer quadrant of right female breast: Secondary | ICD-10-CM

## 2023-12-22 DIAGNOSIS — E876 Hypokalemia: Secondary | ICD-10-CM

## 2023-12-22 DIAGNOSIS — Z5111 Encounter for antineoplastic chemotherapy: Secondary | ICD-10-CM | POA: Diagnosis not present

## 2023-12-22 MED ORDER — FILGRASTIM-SNDZ 480 MCG/0.8ML IJ SOSY
480.0000 ug | PREFILLED_SYRINGE | Freq: Every day | INTRAMUSCULAR | Status: DC
  Administered 2023-12-22: 480 ug via SUBCUTANEOUS
  Filled 2023-12-22: qty 0.8

## 2023-12-22 NOTE — Research (Signed)
 Effectiveness of Out-of-Pocket Psychologist, forensic ([employer]) in Cancer Patients   Baseline Visit Cont:   Research RN called patient this morning to review her Baseline Questionnaires with her over the telephone to ensure there were no changes in any answers since completion was early on 12/20/2023. The patient reviewed all of the questions with the research nurse and denied there were any changes in the answers she provided. The Baseline Questionnaire was reviewed again prior to her registration to the protocol. Patient denied having any questions or concerns.  Reena Romans, RN 12/22/23 10:13 AM

## 2023-12-25 ENCOUNTER — Inpatient Hospital Stay

## 2023-12-25 ENCOUNTER — Encounter: Payer: Self-pay | Admitting: Internal Medicine

## 2023-12-25 DIAGNOSIS — E876 Hypokalemia: Secondary | ICD-10-CM

## 2023-12-25 DIAGNOSIS — Z5111 Encounter for antineoplastic chemotherapy: Secondary | ICD-10-CM | POA: Diagnosis not present

## 2023-12-25 MED ORDER — FILGRASTIM-SNDZ 480 MCG/0.8ML IJ SOSY
480.0000 ug | PREFILLED_SYRINGE | Freq: Every day | INTRAMUSCULAR | Status: DC
  Administered 2023-12-25: 480 ug via SUBCUTANEOUS
  Filled 2023-12-25: qty 0.8

## 2023-12-27 ENCOUNTER — Inpatient Hospital Stay

## 2023-12-27 ENCOUNTER — Encounter: Payer: Self-pay | Admitting: Internal Medicine

## 2023-12-27 VITALS — BP 155/85 | HR 90 | Temp 97.9°F | Resp 18 | Wt 174.2 lb

## 2023-12-27 DIAGNOSIS — Z5111 Encounter for antineoplastic chemotherapy: Secondary | ICD-10-CM | POA: Diagnosis not present

## 2023-12-27 DIAGNOSIS — C50411 Malignant neoplasm of upper-outer quadrant of right female breast: Secondary | ICD-10-CM

## 2023-12-27 LAB — CBC WITH DIFFERENTIAL (CANCER CENTER ONLY)
Abs Immature Granulocytes: 0.33 K/uL — ABNORMAL HIGH (ref 0.00–0.07)
Basophils Absolute: 0.1 K/uL (ref 0.0–0.1)
Basophils Relative: 0 %
Eosinophils Absolute: 0.1 K/uL (ref 0.0–0.5)
Eosinophils Relative: 0 %
HCT: 26.2 % — ABNORMAL LOW (ref 36.0–46.0)
Hemoglobin: 9 g/dL — ABNORMAL LOW (ref 12.0–15.0)
Immature Granulocytes: 2 %
Lymphocytes Relative: 17 %
Lymphs Abs: 2.4 K/uL (ref 0.7–4.0)
MCH: 33 pg (ref 26.0–34.0)
MCHC: 34.4 g/dL (ref 30.0–36.0)
MCV: 96 fL (ref 80.0–100.0)
Monocytes Absolute: 1.1 K/uL — ABNORMAL HIGH (ref 0.1–1.0)
Monocytes Relative: 8 %
Neutro Abs: 10.1 K/uL — ABNORMAL HIGH (ref 1.7–7.7)
Neutrophils Relative %: 73 %
Platelet Count: 169 K/uL (ref 150–400)
RBC: 2.73 MIL/uL — ABNORMAL LOW (ref 3.87–5.11)
RDW: 16.5 % — ABNORMAL HIGH (ref 11.5–15.5)
Smear Review: NORMAL
WBC Count: 13.9 K/uL — ABNORMAL HIGH (ref 4.0–10.5)
nRBC: 0.6 % — ABNORMAL HIGH (ref 0.0–0.2)

## 2023-12-27 LAB — CMP (CANCER CENTER ONLY)
ALT: 16 U/L (ref 0–44)
AST: 18 U/L (ref 15–41)
Albumin: 3.6 g/dL (ref 3.5–5.0)
Alkaline Phosphatase: 90 U/L (ref 38–126)
Anion gap: 8 (ref 5–15)
BUN: 8 mg/dL (ref 8–23)
CO2: 25 mmol/L (ref 22–32)
Calcium: 9.1 mg/dL (ref 8.9–10.3)
Chloride: 105 mmol/L (ref 98–111)
Creatinine: 0.55 mg/dL (ref 0.44–1.00)
GFR, Estimated: >60 mL/min (ref >60)
Glucose, Bld: 133 mg/dL — ABNORMAL HIGH (ref 70–99)
Potassium: 3.5 mmol/L (ref 3.5–5.1)
Sodium: 138 mmol/L (ref 135–145)
Total Bilirubin: 0.4 mg/dL (ref 0.0–1.2)
Total Protein: 6.5 g/dL (ref 6.5–8.1)

## 2023-12-27 MED ORDER — SODIUM CHLORIDE 0.9 % IV SOLN
INTRAVENOUS | Status: DC
  Filled 2023-12-27: qty 250

## 2023-12-27 MED ORDER — FAMOTIDINE IN NACL 20-0.9 MG/50ML-% IV SOLN
20.0000 mg | Freq: Once | INTRAVENOUS | Status: AC
  Administered 2023-12-27: 20 mg via INTRAVENOUS
  Filled 2023-12-27: qty 50

## 2023-12-27 MED ORDER — DIPHENHYDRAMINE HCL 50 MG/ML IJ SOLN
50.0000 mg | Freq: Once | INTRAMUSCULAR | Status: AC
  Administered 2023-12-27: 50 mg via INTRAVENOUS
  Filled 2023-12-27: qty 1

## 2023-12-27 MED ORDER — DEXAMETHASONE SOD PHOSPHATE PF 10 MG/ML IJ SOLN
10.0000 mg | Freq: Once | INTRAMUSCULAR | Status: AC
  Administered 2023-12-27: 10 mg via INTRAVENOUS

## 2023-12-27 MED ORDER — SODIUM CHLORIDE 0.9 % IV SOLN
80.0000 mg/m2 | Freq: Once | INTRAVENOUS | Status: AC
  Administered 2023-12-27: 156 mg via INTRAVENOUS
  Filled 2023-12-27: qty 26

## 2023-12-27 NOTE — Progress Notes (Signed)
 Pharmacist Chemotherapy Monitoring - Initial Assessment    Anticipated start date: 01/10/24   The following has been reviewed per standard work regarding the patient's treatment regimen: The patient's diagnosis, treatment plan and drug doses, and organ/hematologic function Lab orders and baseline tests specific to treatment regimen  The treatment plan start date, drug sequencing, and pre-medications Prior authorization status  Patient's documented medication list, including drug-drug interaction screen and prescriptions for anti-emetics and supportive care specific to the treatment regimen The drug concentrations, fluid compatibility, administration routes, and timing of the medications to be used The patient's access for treatment and lifetime cumulative dose history, if applicable  The patient's medication allergies and previous infusion related reactions, if applicable   Changes made to treatment plan:  N/A  Follow up needed:  Pending authorization for treatment    Redell JINNY Gaskins, RPH, 12/27/2023  11:33 AM

## 2023-12-27 NOTE — Patient Instructions (Signed)

## 2023-12-29 ENCOUNTER — Encounter: Payer: Self-pay | Admitting: Internal Medicine

## 2024-01-01 ENCOUNTER — Ambulatory Visit: Payer: Self-pay | Admitting: Internal Medicine

## 2024-01-03 ENCOUNTER — Inpatient Hospital Stay: Attending: Internal Medicine

## 2024-01-03 ENCOUNTER — Inpatient Hospital Stay: Admitting: Internal Medicine

## 2024-01-03 ENCOUNTER — Inpatient Hospital Stay

## 2024-01-03 ENCOUNTER — Encounter: Payer: Self-pay | Admitting: Internal Medicine

## 2024-01-03 ENCOUNTER — Inpatient Hospital Stay: Admitting: Oncology

## 2024-01-03 VITALS — BP 131/80 | HR 80 | Temp 96.8°F | Resp 16 | Ht 65.0 in | Wt 169.9 lb

## 2024-01-03 DIAGNOSIS — C50411 Malignant neoplasm of upper-outer quadrant of right female breast: Secondary | ICD-10-CM | POA: Insufficient documentation

## 2024-01-03 DIAGNOSIS — Z87891 Personal history of nicotine dependence: Secondary | ICD-10-CM | POA: Diagnosis not present

## 2024-01-03 DIAGNOSIS — Z85118 Personal history of other malignant neoplasm of bronchus and lung: Secondary | ICD-10-CM | POA: Diagnosis not present

## 2024-01-03 DIAGNOSIS — K219 Gastro-esophageal reflux disease without esophagitis: Secondary | ICD-10-CM | POA: Diagnosis not present

## 2024-01-03 DIAGNOSIS — I10 Essential (primary) hypertension: Secondary | ICD-10-CM | POA: Diagnosis not present

## 2024-01-03 DIAGNOSIS — Z5111 Encounter for antineoplastic chemotherapy: Secondary | ICD-10-CM | POA: Diagnosis present

## 2024-01-03 DIAGNOSIS — R300 Dysuria: Secondary | ICD-10-CM | POA: Insufficient documentation

## 2024-01-03 DIAGNOSIS — Z803 Family history of malignant neoplasm of breast: Secondary | ICD-10-CM | POA: Insufficient documentation

## 2024-01-03 DIAGNOSIS — Z86000 Personal history of in-situ neoplasm of breast: Secondary | ICD-10-CM | POA: Insufficient documentation

## 2024-01-03 DIAGNOSIS — E876 Hypokalemia: Secondary | ICD-10-CM | POA: Diagnosis not present

## 2024-01-03 DIAGNOSIS — Z801 Family history of malignant neoplasm of trachea, bronchus and lung: Secondary | ICD-10-CM | POA: Insufficient documentation

## 2024-01-03 DIAGNOSIS — R197 Diarrhea, unspecified: Secondary | ICD-10-CM | POA: Diagnosis not present

## 2024-01-03 DIAGNOSIS — Z79630 Long term (current) use of alkylating agent: Secondary | ICD-10-CM | POA: Diagnosis not present

## 2024-01-03 DIAGNOSIS — Z5189 Encounter for other specified aftercare: Secondary | ICD-10-CM | POA: Diagnosis not present

## 2024-01-03 DIAGNOSIS — C773 Secondary and unspecified malignant neoplasm of axilla and upper limb lymph nodes: Secondary | ICD-10-CM | POA: Insufficient documentation

## 2024-01-03 DIAGNOSIS — Z170 Estrogen receptor positive status [ER+]: Secondary | ICD-10-CM

## 2024-01-03 DIAGNOSIS — Z79632 Long term (current) use of antitumor antibiotic: Secondary | ICD-10-CM | POA: Diagnosis not present

## 2024-01-03 DIAGNOSIS — Z1501 Genetic susceptibility to malignant neoplasm of breast: Secondary | ICD-10-CM | POA: Insufficient documentation

## 2024-01-03 LAB — CMP (CANCER CENTER ONLY)
ALT: 27 U/L (ref 0–44)
AST: 23 U/L (ref 15–41)
Albumin: 3.4 g/dL — ABNORMAL LOW (ref 3.5–5.0)
Alkaline Phosphatase: 59 U/L (ref 38–126)
Anion gap: 7 (ref 5–15)
BUN: 8 mg/dL (ref 8–23)
CO2: 24 mmol/L (ref 22–32)
Calcium: 8.8 mg/dL — ABNORMAL LOW (ref 8.9–10.3)
Chloride: 108 mmol/L (ref 98–111)
Creatinine: 0.48 mg/dL (ref 0.44–1.00)
GFR, Estimated: >60 mL/min (ref >60)
Glucose, Bld: 112 mg/dL — ABNORMAL HIGH (ref 70–99)
Potassium: 3.6 mmol/L (ref 3.5–5.1)
Sodium: 139 mmol/L (ref 135–145)
Total Bilirubin: 0.5 mg/dL (ref 0.0–1.2)
Total Protein: 6.3 g/dL — ABNORMAL LOW (ref 6.5–8.1)

## 2024-01-03 LAB — CBC WITH DIFFERENTIAL (CANCER CENTER ONLY)
Abs Immature Granulocytes: 0.03 K/uL (ref 0.00–0.07)
Basophils Absolute: 0 K/uL (ref 0.0–0.1)
Basophils Relative: 1 %
Eosinophils Absolute: 0 K/uL (ref 0.0–0.5)
Eosinophils Relative: 1 %
HCT: 24.4 % — ABNORMAL LOW (ref 36.0–46.0)
Hemoglobin: 8.2 g/dL — ABNORMAL LOW (ref 12.0–15.0)
Immature Granulocytes: 1 %
Lymphocytes Relative: 40 %
Lymphs Abs: 1.5 K/uL (ref 0.7–4.0)
MCH: 32.3 pg (ref 26.0–34.0)
MCHC: 33.6 g/dL (ref 30.0–36.0)
MCV: 96.1 fL (ref 80.0–100.0)
Monocytes Absolute: 0.3 K/uL (ref 0.1–1.0)
Monocytes Relative: 7 %
Neutro Abs: 1.9 K/uL (ref 1.7–7.7)
Neutrophils Relative %: 50 %
Platelet Count: 268 K/uL (ref 150–400)
RBC: 2.54 MIL/uL — ABNORMAL LOW (ref 3.87–5.11)
RDW: 16.3 % — ABNORMAL HIGH (ref 11.5–15.5)
WBC Count: 3.7 K/uL — ABNORMAL LOW (ref 4.0–10.5)
nRBC: 0 % (ref 0.0–0.2)

## 2024-01-03 NOTE — Progress Notes (Signed)
 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   # JULY-AUG 2025- RIGHT BREAST T2; intramammary lymph node positive- N-1- ER-PR POSITIVE: her 2 neg; Ki-67-50%; right breast cancer with T2N1- ER/PR + her 2 NEG breast cancer.   # AUG 14th, 2025- carbo-Taxol - -HOLD taxol  cycle #12 today-sec to PN/fatigue-and also mild anemia. NOV 12th, 2025- start [facility] q 3 W  # Colonoscopy [Dr.sankar-Neg June 2017]  # Smoker; BRCA-1 positive   Primary cancer of right upper lobe of lung (HCC)  10/26/2015 Initial Diagnosis   Primary cancer of right upper lobe of lung (HCC)   Carcinoma of upper-outer quadrant of right breast in female, estrogen receptor positive (HCC)  09/12/2023 Initial Diagnosis   Carcinoma of upper-outer quadrant of right breast in female, estrogen receptor positive (HCC)   09/12/2023 Cancer Staging   Staging form: Breast, AJCC 8th Edition - Clinical: Stage IIA (cT2, cN1, cM0, G2, ER+, PR+, HER2-) - Signed by Rennie Cindy SAUNDERS, MD on 09/29/2023 Histologic grading system: 3 grade system   10/12/2023 -  Chemotherapy   Patient is on Treatment Plan : BREAST Paclitaxel  q7d / AC q21d       HISTORY OF PRESENTING ILLNESS: Patient ambulating-independently. ALONE>   Discussed the use of AI scribe software for clinical note transcription with the patient, who gave verbal consent to proceed.  History of  Present Illness   Connie Osborne is a 63 year old female with BRCA1 related breast cancer who presents with worsening fatigue and neuropathy.  She experiences significant fatigue, characterized by excessive sleep patterns, including sleeping all day after work and continuing through the night. She also feels lightheaded and experiences 'brain fog'.  She describes neuropathy with tingling and numbness, particularly in her fingertips, which are very sore. She attributes this soreness to fungal infections and uses ice gloves for relief.  Her blood work shows low counts, with hemoglobin at 8.2 g/dL, white blood cell count at 3.7 x10^9/L, and slightly low platelets, which she associates with her chemotherapy. She has completed 11 weekly Taxol  so far.  She stopped taking Lipitor after her sister informed her about news regarding the medication. Her appetite affects her gastrointestinal symptoms, with better appetite leading to less diarrhea.  She has decided to undergo surgery for a mastectomy. The mass in her breast has reduced in size from 37x30x34 mm to 22x24x15 mm.      Review of Systems  Constitutional:  Positive for malaise/fatigue. Negative for chills, diaphoresis, fever and weight loss.  HENT:  Negative for nosebleeds and sore throat.   Eyes:  Negative for double vision.  Respiratory:  Negative for cough, hemoptysis, sputum production, shortness of breath and wheezing.   Cardiovascular:  Negative for chest pain, palpitations, orthopnea and leg swelling.  Gastrointestinal:  Negative for abdominal pain, blood in stool, constipation, diarrhea, heartburn, melena, nausea and vomiting.  Genitourinary:  Negative for dysuria, frequency and urgency.  Musculoskeletal:  Positive for back pain and joint pain.  Skin: Negative.  Negative for itching and rash.  Neurological:  Negative for dizziness, tingling, focal weakness, weakness and headaches.  Endo/Heme/Allergies:  Does not bruise/bleed easily.   Psychiatric/Behavioral:  Negative for depression. The patient is not nervous/anxious and does not have insomnia.     MEDICAL HISTORY:  Past Medical History:  Diagnosis Date   Allergy    Anxiety    Arthritis    Breast cancer (HCC) 2014   Left- Radiation; BRCA 1 +    COPD (chronic obstructive pulmonary disease) (HCC)    Coughing up blood    GERD (gastroesophageal reflux disease)    Hypertension    Lung cancer (HCC)    Carcinoma, right upper lobe    Personal history of radiation therapy    Pre-diabetes    Shortness of breath dyspnea    Vitamin D deficiency     SURGICAL HISTORY: Past Surgical History:  Procedure Laterality Date   ABDOMINAL HYSTERECTOMY     BREAST BIOPSY Left 2014   +   BREAST BIOPSY Left 06/11/2019   stereo bx, x-clip, negative   BREAST BIOPSY Right 08/30/2023   US  RT BREAST BX W LOC DEV 1ST LESION IMG BX SPEC US  GUIDE 08/30/2023 [facility]   BREAST BIOPSY Right 08/30/2023   US  RT BREAST BX W LOC DEV EA ADD LESION IMG BX SPEC US  GUIDE 08/30/2023 [facility]   BREAST EXCISIONAL BIOPSY Left 2014   BREAST LUMPECTOMY Left 2014   BREAST SURGERY Left 2014   lumpectomy   COLONOSCOPY WITH PROPOFOL  N/A 08/18/2015   Procedure: COLONOSCOPY WITH PROPOFOL ;  Surgeon: Louanne KANDICE Muse, MD;  Location: [facility];  Service: Endoscopy;  Laterality: N/A;   COLONOSCOPY WITH PROPOFOL  N/A 10/12/2016   Procedure: COLONOSCOPY WITH PROPOFOL ;  Surgeon: Muse Louanne KANDICE, MD;  Location: [facility];  Service: Endoscopy;  Laterality: N/A;   ELECTROMAGNETIC NAVIGATION BROCHOSCOPY Right 07/28/2015   Procedure: ELECTROMAGNETIC NAVIGATION BRONCHOSCOPY;  Surgeon: Nickolas Cellar, MD;  Location: [facility] ORS;  Service: Cardiopulmonary;  Laterality: Right;   FOOT SURGERY     FRACTURE SURGERY     GANGLION CYST EXCISION     PORTACATH PLACEMENT N/A 10/02/2023   Procedure: INSERTION, TUNNELED CENTRAL VENOUS DEVICE, WITH PORT;  Surgeon: Rodolph Romano, MD;  Location: [facility]  ORS;  Service: General;  Laterality: N/A;   THORACOTOMY/LOBECTOMY Right 09/14/2015   Procedure: THORACOTOMY/LOBECTOMY;  Surgeon: Louanne KANDICE Muse, MD;  Location: [facility] ORS;  Service: Thoracic;  Laterality: Right;    SOCIAL HISTORY: Social History   Socioeconomic History   Marital status: Single    Spouse name: Not on file   Number of children: Not on file   Years of education: Not on file   Highest education level: Not on file  Occupational History   Not on file  Tobacco Use   Smoking status: Former    Current packs/day: 0.00    Average packs/day: 1 pack/day for 30.0 years (30.0 ttl pk-yrs)    Types: Cigarettes    Start date: 07/29/1985    Quit date: 07/30/2015    Years since quitting: 8.4   Smokeless tobacco: Never  Vaping Use   Vaping status: Never Used  Substance and Sexual Activity   Alcohol use: No    Alcohol/week: 0.0 standard drinks of alcohol   Drug use: No   Sexual activity: Not on file  Other Topics Concern   Not on file  Social History Narrative   Lives alone   Social Drivers of Health   Financial Resource Strain: Medium  Risk (09/07/2023)   Received from [facility]   Overall Financial Resource Strain (CARDIA)    Difficulty of Paying Living Expenses: Somewhat hard  Food Insecurity: No Food Insecurity (09/12/2023)   Hunger Vital Sign    Worried About Running Out of Food in the Last Year: Never true    Ran Out of Food in the Last Year: Never true  Recent Concern: Food Insecurity - Food Insecurity Present (09/07/2023)   Received from [facility]   Hunger Vital Sign    Within the past 12 months, you worried that your food would run out before you got the money to buy more.: Sometimes true    Within the past 12 months, the food you bought just didn't last and you didn't have money to get more.: Never true  Transportation Needs: No Transportation Needs (09/12/2023)   PRAPARE - Administrator, Civil Service  (Medical): No    Lack of Transportation (Non-Medical): No  Physical Activity: Not on file  Stress: Not on file  Social Connections: Not on file  Intimate Partner Violence: Not At Risk (09/12/2023)   Humiliation, Afraid, Rape, and Kick questionnaire    Fear of Current or Ex-Partner: No    Emotionally Abused: No    Physically Abused: No    Sexually Abused: No    FAMILY HISTORY: Family History  Problem Relation Age of Onset   Hypertension Mother    Diabetes Mellitus II Mother    Cancer Mother        mets   Cancer Father        long cancer   Cancer Sister 75       breast   Breast cancer Sister 19   Cancer Sister 99       breast   Breast cancer Sister 64   Lung cancer Brother    Cancer Other        breast    ALLERGIES:  is allergic to accupril [quinapril hcl] and percocet [oxycodone -acetaminophen ].  MEDICATIONS:  Current Outpatient Medications  Medication Sig Dispense Refill   amLODipine  (NORVASC ) 10 MG tablet Take 10 mg by mouth daily with lunch.      Calcium Carb-Cholecalciferol (CALCIUM + VITAMIN D3 PO) Take 1 tablet by mouth in the morning and at bedtime.     cetirizine (ZYRTEC) 10 MG tablet Take 10 mg by mouth as needed for allergies.     diphenoxylate -atropine  (LOMOTIL ) 2.5-0.025 MG tablet Take 1 tablet by mouth 4 (four) times daily as needed for diarrhea or loose stools. Take it along with immodium 60 tablet 0   famotidine  (PEPCID ) 20 MG tablet Take 20 mg by mouth daily as needed for heartburn or indigestion.     glipiZIDE  (GLUCOTROL  XL) 5 MG 24 hr tablet Take 1 tablet (5 mg total) by mouth daily with breakfast. 30 tablet 6   lidocaine -prilocaine  (EMLA ) cream Apply on the port. 30 -45 min  prior to port access. 30 g 3   losartan  (COZAAR ) 100 MG tablet Take 100 mg by mouth daily with lunch.      metFORMIN  (GLUCOPHAGE ) 500 MG tablet Take 500 mg by mouth 2 (two) times daily with a meal.     metoprolol  succinate (TOPROL -XL) 25 MG 24 hr tablet Take 25 mg by mouth daily with  lunch.      ondansetron  (ZOFRAN ) 8 MG tablet One pill every 8 hours as needed for nausea/vomitting. 40 tablet 1   Potassium Chloride  ER 20 MEQ  TBCR Take 1 tablet (20 mEq total) by mouth 2 (two) times daily. 120 tablet 1   prochlorperazine  (COMPAZINE ) 10 MG tablet Take 1 tablet (10 mg total) by mouth every 6 (six) hours as needed for nausea or vomiting. 30 tablet 0   traMADol  (ULTRAM ) 50 MG tablet Take 1 tablet (50 mg total) by mouth every 6 (six) hours as needed. 10 tablet 0   No current facility-administered medications for this visit.    PHYSICAL EXAMINATION:   Vitals:   01/03/24 0938  BP: 131/80  Pulse: 80  Resp: 16  Temp: (!) 96.8 F (36 C)  SpO2: 100%   Filed Weights   01/03/24 0938  Weight: 169 lb 14.4 oz (77.1 kg)   10/1-improvement of the right breast mass.  Vaguely felt-   Physical Exam Vitals and nursing note reviewed.  HENT:     Head: Normocephalic and atraumatic.     Mouth/Throat:     Pharynx: Oropharynx is clear.  Eyes:     Extraocular Movements: Extraocular movements intact.     Pupils: Pupils are equal, round, and reactive to light.  Cardiovascular:     Rate and Rhythm: Normal rate and regular rhythm.  Pulmonary:     Comments: Decreased breath sounds bilaterally.  Abdominal:     Palpations: Abdomen is soft.  Musculoskeletal:        General: Normal range of motion.     Cervical back: Normal range of motion.  Skin:    General: Skin is warm.  Neurological:     General: No focal deficit present.     Mental Status: She is alert and oriented to person, place, and time.  Psychiatric:        Behavior: Behavior normal.        Judgment: Judgment normal.     LABORATORY DATA:  I have reviewed the data as listed Lab Results  Component Value Date   WBC 3.7 (L) 01/03/2024   HGB 8.2 (L) 01/03/2024   HCT 24.4 (L) 01/03/2024   MCV 96.1 01/03/2024   PLT 268 01/03/2024   Recent Labs    12/20/23 0848 12/27/23 0947 01/03/24 0922  NA 139 138 139  K 3.4*  3.5 3.6  CL 107 105 108  CO2 24 25 24   GLUCOSE 158* 133* 112*  BUN 12 8 8   CREATININE 0.65 0.55 0.48  CALCIUM 9.3 9.1 8.8*  GFRNONAA >60 >60 >60  PROT 6.3* 6.5 6.3*  ALBUMIN 3.5 3.6 3.4*  AST 22 18 23   ALT 25 16 27   ALKPHOS 55 90 59  BILITOT 0.5 0.4 0.5    RADIOGRAPHIC STUDIES: I have personally reviewed the radiological images as listed and agreed with the findings in the report. MM DIAG BREAST TOMO UNI RIGHT Result Date: 12/15/2023 CLINICAL DATA:  Tumor check status post neoadjuvant chemotherapy. History of RIGHT breast invasive ductal carcinoma segments September 05, 2023. Patient underwent ultrasound-guided biopsy of a RIGHT breast mass at 1 o'clock (RIBBON clip) as well as an intramammary lymph node (COIL clip) which demonstrated invasive ductal carcinoma. Staging MRI demonstrated an enlarged level 1 RIGHT axillary lymph node it was subsequently biopsy and demonstrated metastatic disease (HYDROMARK). History of a LEFT breast cancer in 2014 with a benign stereotactic guided biopsy in 2021 of calcifications. EXAM: DIGITAL DIAGNOSTIC UNILATERAL RIGHT MAMMOGRAM WITH TOMOSYNTHESIS AND CAD; ULTRASOUND RIGHT BREAST LIMITED TECHNIQUE: Right digital diagnostic mammography and breast tomosynthesis was performed. The images were evaluated with computer-aided detection. ; Targeted ultrasound examination of the right  breast was performed COMPARISON:  Previous exam(s). ACR Breast Density Category b: There are scattered areas of fibroglandular density. FINDINGS: Diagnostic images of the RIGHT breast demonstrate an irregular mass with associated RIBBON shaped biopsy marking clip in the RIGHT upper breast at posterior depth. Spot magnification views demonstrate increased anteriorly extending calcifications compared to prior. They span at least 15 mm in anterior-posterior extent, previously approximately 5 mm. These could only be assessed on MLO view due to positioning and patient tolerance. Mass itself is  decreased in size compared to prior. Decreased prominence of an intramammary lymph node is noted in the RIGHT upper outer breast with associated COIL shaped biopsy marking clip. A second non sampled adjacent intramammary lymph node has also decreased in size compared to prior. Two additional RIGHT axillary lymph nodes seen on MLO view slice 74 have decreased since prior as well. HYDROMARK clip is noted in the RIGHT axilla in association with a minimally decreased size of a RIGHT axillary lymph node. Targeted ultrasound was performed of the RIGHT breast. At 1 o'clock 12 cm from nipple there is revisualization of an irregular hypoechoic mass. It measures 22 x 24 x 15 mm, previously 37 x 30 x 34 mm. It demonstrates an associated echogenic focus consistent with biopsy marking clip. Targeted ultrasound was performed of the RIGHT upper outer breast. Intramammary lymph nodes have decreased in size compared to prior. They demonstrate preserved echogenic hila and thin smooth cortices measuring approximately 1 mm in thickness, previously 3 mm. Targeted ultrasound was performed of the RIGHT axilla. Previously sampled level 1 RIGHT axillary lymph node now demonstrates normal morphology with a thin smooth hilum, thinner compared to prior. HYDROMARK biopsy marking clip is noted to be immediately adjacent to the axillary lymph node. IMPRESSION: 1. Decrease in size of a biopsy-proven RIGHT breast invasive ductal carcinoma from 37 mm to 24 mm. Increased adjacent calcifications likely reflect the sequela of treatment response. If breast conservation surgery is pursued, recommend bracketed excision of adjacent calcifications. 2. Decrease in size of multiple intramammary lymph nodes, consistent with treatment response. They are now morphologically normal in appearance 3. Decrease in size of the biopsy-proven metastatic RIGHT axillary lymph node. It now morphologically normal in appearance. Of note, HYDROMARK biopsy marking clip is  immediately adjacent to the RIGHT axillary lymph node. RECOMMENDATION: Recommend continued clinical management of biopsy proven malignancy I have discussed the findings and recommendations with the patient. If applicable, a reminder letter will be sent to the patient regarding the next appointment. BI-RADS CATEGORY  6: Known biopsy-proven malignancy. Electronically Signed   By: Corean Salter M.D.   On: 12/15/2023 12:57   US  Breast Limited Uni Right Inc Axilla Result Date: 12/15/2023 CLINICAL DATA:  Tumor check status post neoadjuvant chemotherapy. History of RIGHT breast invasive ductal carcinoma segments September 05, 2023. Patient underwent ultrasound-guided biopsy of a RIGHT breast mass at 1 o'clock (RIBBON clip) as well as an intramammary lymph node (COIL clip) which demonstrated invasive ductal carcinoma. Staging MRI demonstrated an enlarged level 1 RIGHT axillary lymph node it was subsequently biopsy and demonstrated metastatic disease (HYDROMARK). History of a LEFT breast cancer in 2014 with a benign stereotactic guided biopsy in 2021 of calcifications. EXAM: DIGITAL DIAGNOSTIC UNILATERAL RIGHT MAMMOGRAM WITH TOMOSYNTHESIS AND CAD; ULTRASOUND RIGHT BREAST LIMITED TECHNIQUE: Right digital diagnostic mammography and breast tomosynthesis was performed. The images were evaluated with computer-aided detection. ; Targeted ultrasound examination of the right breast was performed COMPARISON:  Previous exam(s). ACR Breast Density Category b: There  are scattered areas of fibroglandular density. FINDINGS: Diagnostic images of the RIGHT breast demonstrate an irregular mass with associated RIBBON shaped biopsy marking clip in the RIGHT upper breast at posterior depth. Spot magnification views demonstrate increased anteriorly extending calcifications compared to prior. They span at least 15 mm in anterior-posterior extent, previously approximately 5 mm. These could only be assessed on MLO view due to positioning and  patient tolerance. Mass itself is decreased in size compared to prior. Decreased prominence of an intramammary lymph node is noted in the RIGHT upper outer breast with associated COIL shaped biopsy marking clip. A second non sampled adjacent intramammary lymph node has also decreased in size compared to prior. Two additional RIGHT axillary lymph nodes seen on MLO view slice 74 have decreased since prior as well. HYDROMARK clip is noted in the RIGHT axilla in association with a minimally decreased size of a RIGHT axillary lymph node. Targeted ultrasound was performed of the RIGHT breast. At 1 o'clock 12 cm from nipple there is revisualization of an irregular hypoechoic mass. It measures 22 x 24 x 15 mm, previously 37 x 30 x 34 mm. It demonstrates an associated echogenic focus consistent with biopsy marking clip. Targeted ultrasound was performed of the RIGHT upper outer breast. Intramammary lymph nodes have decreased in size compared to prior. They demonstrate preserved echogenic hila and thin smooth cortices measuring approximately 1 mm in thickness, previously 3 mm. Targeted ultrasound was performed of the RIGHT axilla. Previously sampled level 1 RIGHT axillary lymph node now demonstrates normal morphology with a thin smooth hilum, thinner compared to prior. HYDROMARK biopsy marking clip is noted to be immediately adjacent to the axillary lymph node. IMPRESSION: 1. Decrease in size of a biopsy-proven RIGHT breast invasive ductal carcinoma from 37 mm to 24 mm. Increased adjacent calcifications likely reflect the sequela of treatment response. If breast conservation surgery is pursued, recommend bracketed excision of adjacent calcifications. 2. Decrease in size of multiple intramammary lymph nodes, consistent with treatment response. They are now morphologically normal in appearance 3. Decrease in size of the biopsy-proven metastatic RIGHT axillary lymph node. It now morphologically normal in appearance. Of note,  HYDROMARK biopsy marking clip is immediately adjacent to the RIGHT axillary lymph node. RECOMMENDATION: Recommend continued clinical management of biopsy proven malignancy I have discussed the findings and recommendations with the patient. If applicable, a reminder letter will be sent to the patient regarding the next appointment. BI-RADS CATEGORY  6: Known biopsy-proven malignancy. Electronically Signed   By: Corean Salter M.D.   On: 12/15/2023 12:57      Carcinoma of upper-outer quadrant of right breast in female, estrogen receptor positive (HCC) Any more questions for me thanks # RIGHT breast- INVASIVE DUCTAL CARCINOMA WITH PAPILLARY FEATURES- T2 [3.7cm] & positive IMC intramammary lymph node-  cN0-positive for LVI ; grade 2 -ER 95% PR 80% HER2/neu 1+/negative. Ki-67 50%- ONE LYMPH NODE, POSITIVE FOR METASTATIC CARCINOMA (1/1); METASTATIC FOCUS: 4 MM; SUSPICIOUS FOR EXTRANODAL EXTENSION Dr.Cintron- ONCOTYPE- 28. Discussed the role of endocrine therapy-given ER/PR positive disease postsurgery; antihormone pill 1 a day for 10 years;  adjuvant Zometa. Also consider CDK- inhibitors/ PARP inhibitors post surgery. Given multifocality-large size of the tumor; given BRCA 1 positive- PLAN with mastectomy with contralateral prophylactic mastectomy. AUG 18th, 2025- [facility] scan-ejection fraction of 56%.# ON neo-adjuvant chemo- taxol  weekly- with carbo q 3W; and followed by  Adriamycin-Cytoxan every 3  weeks x 4 cycles.   Status post Taxol  weekly - # OCT 17th, 2025-  Decrease in size of a biopsy-proven RIGHT breast invasive ductal carcinoma from 37 mm to 24 mm.  Also improvement of the right axilla lymphadenopathy.  # CURRENTLY ON neo-adjuvant chemo-HOLD taxol  cycle #12 today-sec to PN/fatigue-and also mild anemia and given overall good response.  Will plan to proceed with AC chemotherapy every 3 weeks starting next week.  # blood per rectum- with stool- sec to hemorrhoids monitor for now- stable.   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 Recommend getting- compliance Icepack-stable.  # hypokalemia-]likely from diarrhea].-continue  K-Dur twice a day --stable.  # diarrhea -G-1-2- f[rom chemo- metformin - STOPPED metformin ]-IMPROVED  Imodium prn/ ADDlomotil prn.      .  # GERD:  on PPI- stable.   # HTN:  stable.    # Hx of Borderline DM-  s/p nutrition- STOPPED  metformin  1000 mg ER/ [sec to Diarrhea]- add glipizide  5 mg XL /day- stable.  # BRCA-positive- prophylactic contralateral mastectomy.  Consider bilateral prophylactic oophorectomy down the line.  Patient interested in mastectomy.  # Social- FMLA/disability-    PS-  # DISPOSITION: #  HOLD  chemo; de-access  # as per IS-in 1 weeks- MD; labs- chemo; D-2 injection- # follow up in 4 weeks- MD; labs- chemo; D-2 injection- Dr.B      Cindy JONELLE Joe, MD 01/03/2024 1:14 PM

## 2024-01-03 NOTE — Progress Notes (Signed)
 Nutrition  RD planning to see patient during infusion today but infusion cancelled.     Kimberl Vig B. Dasie SOLON, CSO, LDN Registered Dietitian [phone]

## 2024-01-03 NOTE — Progress Notes (Signed)
 C/o no energy and sleeping a lot. Feels like brain fog.   Does she need to stop her Lipitor due to the possibility of causing cancer?

## 2024-01-03 NOTE — Assessment & Plan Note (Addendum)
 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 Ki-67 50%- ONE LYMPH NODE, POSITIVE FOR METASTATIC CARCINOMA (1/1); METASTATIC FOCUS: 4 MM; SUSPICIOUS FOR EXTRANODAL EXTENSION Dr.Cintron- ONCOTYPE- 28. Discussed the role of endocrine therapy-given ER/PR positive disease postsurgery; antihormone pill 1 a day for 10 years;  adjuvant Zometa. Also consider CDK- inhibitors/ PARP inhibitors post surgery. Given multifocality-large size of the tumor; given BRCA 1 positive- PLAN with mastectomy with contralateral prophylactic mastectomy. AUG 18th, 2025- [facility] scan-ejection fraction of 56%.# ON neo-adjuvant chemo- taxol  weekly- with carbo q 3W; and followed by  Adriamycin-Cytoxan every 3  weeks x 4 cycles.   Status post Taxol  weekly - # OCT 17th, 2025- Decrease in size of a biopsy-proven RIGHT breast invasive ductal carcinoma from 37 mm to 24 mm.  Also improvement of the right axilla lymphadenopathy.  # CURRENTLY ON neo-adjuvant chemo-HOLD taxol  cycle #12 today-sec to PN/fatigue-and also mild anemia and given overall good response.  Will plan to proceed with AC chemotherapy every 3 weeks starting next week.  # blood per rectum- with stool- sec to hemorrhoids monitor for now- stable.   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 Recommend getting- compliance Icepack-stable.  # hypokalemia-]likely from diarrhea].-continue  K-Dur twice a day --stable.  # diarrhea -G-1-2- f[rom chemo- metformin - STOPPED metformin ]-IMPROVED  Imodium prn/ ADDlomotil prn.      .  # GERD:  on PPI- stable.   # HTN:  stable.    # Hx of Borderline DM-  s/p nutrition- STOPPED  metformin  1000 mg ER/ [sec to Diarrhea]- add glipizide  5 mg XL /day- stable.  # BRCA-positive- prophylactic contralateral mastectomy.  Consider bilateral prophylactic oophorectomy down the  line.  Patient interested in mastectomy.  # Social- FMLA/disability-    PS-  # DISPOSITION: #  HOLD  chemo; de-access  # as per IS-in 1 weeks- MD; labs- chemo; D-2 injection- # follow up in 4 weeks- MD; labs- chemo; D-2 injection- Dr.B

## 2024-01-04 ENCOUNTER — Other Ambulatory Visit: Payer: Self-pay

## 2024-01-09 MED FILL — Fosaprepitant Dimeglumine For IV Infusion 150 MG (Base Eq): INTRAVENOUS | Qty: 5 | Status: AC

## 2024-01-10 ENCOUNTER — Encounter: Payer: Self-pay | Admitting: Internal Medicine

## 2024-01-10 ENCOUNTER — Inpatient Hospital Stay

## 2024-01-10 ENCOUNTER — Inpatient Hospital Stay: Admitting: Internal Medicine

## 2024-01-10 VITALS — BP 147/81 | HR 78

## 2024-01-10 VITALS — BP 133/72 | HR 84 | Temp 98.2°F | Resp 16 | Ht 65.0 in | Wt 171.9 lb

## 2024-01-10 DIAGNOSIS — C50411 Malignant neoplasm of upper-outer quadrant of right female breast: Secondary | ICD-10-CM

## 2024-01-10 DIAGNOSIS — Z170 Estrogen receptor positive status [ER+]: Secondary | ICD-10-CM | POA: Diagnosis not present

## 2024-01-10 DIAGNOSIS — Z5111 Encounter for antineoplastic chemotherapy: Secondary | ICD-10-CM | POA: Diagnosis not present

## 2024-01-10 LAB — CBC WITH DIFFERENTIAL (CANCER CENTER ONLY)
Abs Immature Granulocytes: 0.01 K/uL (ref 0.00–0.07)
Basophils Absolute: 0.1 K/uL (ref 0.0–0.1)
Basophils Relative: 1 %
Eosinophils Absolute: 0 K/uL (ref 0.0–0.5)
Eosinophils Relative: 1 %
HCT: 28.2 % — ABNORMAL LOW (ref 36.0–46.0)
Hemoglobin: 9.2 g/dL — ABNORMAL LOW (ref 12.0–15.0)
Immature Granulocytes: 0 %
Lymphocytes Relative: 44 %
Lymphs Abs: 1.7 K/uL (ref 0.7–4.0)
MCH: 31.8 pg (ref 26.0–34.0)
MCHC: 32.6 g/dL (ref 30.0–36.0)
MCV: 97.6 fL (ref 80.0–100.0)
Monocytes Absolute: 0.4 K/uL (ref 0.1–1.0)
Monocytes Relative: 11 %
Neutro Abs: 1.7 K/uL (ref 1.7–7.7)
Neutrophils Relative %: 43 %
Platelet Count: 362 K/uL (ref 150–400)
RBC: 2.89 MIL/uL — ABNORMAL LOW (ref 3.87–5.11)
RDW: 17.1 % — ABNORMAL HIGH (ref 11.5–15.5)
WBC Count: 3.9 K/uL — ABNORMAL LOW (ref 4.0–10.5)
nRBC: 0 % (ref 0.0–0.2)

## 2024-01-10 LAB — CMP (CANCER CENTER ONLY)
ALT: 13 U/L (ref 0–44)
AST: 23 U/L (ref 15–41)
Albumin: 3.5 g/dL (ref 3.5–5.0)
Alkaline Phosphatase: 61 U/L (ref 38–126)
Anion gap: 9 (ref 5–15)
BUN: 10 mg/dL (ref 8–23)
CO2: 23 mmol/L (ref 22–32)
Calcium: 8.8 mg/dL — ABNORMAL LOW (ref 8.9–10.3)
Chloride: 109 mmol/L (ref 98–111)
Creatinine: 0.61 mg/dL (ref 0.44–1.00)
GFR, Estimated: >60 mL/min (ref >60)
Glucose, Bld: 184 mg/dL — ABNORMAL HIGH (ref 70–99)
Potassium: 3.3 mmol/L — ABNORMAL LOW (ref 3.5–5.1)
Sodium: 141 mmol/L (ref 135–145)
Total Bilirubin: 0.3 mg/dL (ref 0.0–1.2)
Total Protein: 6.5 g/dL (ref 6.5–8.1)

## 2024-01-10 MED ORDER — DEXAMETHASONE SOD PHOSPHATE PF 10 MG/ML IJ SOLN
10.0000 mg | Freq: Once | INTRAMUSCULAR | Status: AC
  Administered 2024-01-10: 10 mg via INTRAVENOUS

## 2024-01-10 MED ORDER — SODIUM CHLORIDE 0.9 % IV SOLN
150.0000 mg | Freq: Once | INTRAVENOUS | Status: AC
  Administered 2024-01-10: 150 mg via INTRAVENOUS
  Filled 2024-01-10: qty 150

## 2024-01-10 MED ORDER — PALONOSETRON HCL INJECTION 0.25 MG/5ML
0.2500 mg | Freq: Once | INTRAVENOUS | Status: AC
  Administered 2024-01-10: 0.25 mg via INTRAVENOUS
  Filled 2024-01-10: qty 5

## 2024-01-10 MED ORDER — SODIUM CHLORIDE 0.9 % IV SOLN
600.0000 mg/m2 | Freq: Once | INTRAVENOUS | Status: AC
  Administered 2024-01-10: 1160 mg via INTRAVENOUS
  Filled 2024-01-10: qty 58

## 2024-01-10 MED ORDER — DOXORUBICIN HCL CHEMO IV INJECTION 2 MG/ML
60.0000 mg/m2 | Freq: Once | INTRAVENOUS | Status: AC
  Administered 2024-01-10: 116 mg via INTRAVENOUS
  Filled 2024-01-10: qty 58

## 2024-01-10 MED ORDER — GABAPENTIN 100 MG PO CAPS: ORAL_CAPSULE | ORAL | 1 refills | Status: AC

## 2024-01-10 MED ORDER — SODIUM CHLORIDE 0.9 % IV SOLN
INTRAVENOUS | Status: DC
  Filled 2024-01-10 (×2): qty 250

## 2024-01-10 NOTE — Assessment & Plan Note (Addendum)
 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 Ki-67 50%- ONE LYMPH NODE, POSITIVE FOR METASTATIC CARCINOMA (1/1); METASTATIC FOCUS: 4 MM; SUSPICIOUS FOR EXTRANODAL EXTENSION Dr.Cintron- ONCOTYPE- 28. Discussed the role of endocrine therapy-given ER/PR positive disease postsurgery; antihormone pill 1 a day for 10 years;  adjuvant Zometa. Also consider CDK- inhibitors/ PARP inhibitors post surgery. Given multifocality-large size of the tumor; given BRCA 1 positive- PLAN with mastectomy with contralateral prophylactic mastectomy. AUG 18th, 2025- [facility] scan-ejection fraction of 56%.# ON neo-adjuvant chemo- taxol  weekly- with carbo q 3W; and followed by  Adriamycin-Cytoxan every 3  weeks x 4 cycles. OCT 17th, 2025- Decrease in size of a biopsy-proven RIGHT breast invasive ductal carcinoma from 37 mm to 24 mm.  Also improvement of the right axilla lymphadenopathy.  # CURRENTLY ON neo-adjuvant chemo- proceed with AC chemotherapy every 3 weeks- cycle #1 today- Labs-CBC/chemistries were reviewed with the patient. Growth factor would be given as prophylaxis for chemotherapy-induced neutropenia to prevent febrile neutropenias. Discussed potential side effect- myalgias/arthralgias- # To help prevent pain from the booster injection recommend Claritin  a day for 7 days; and also can take tylenol /advil as needed.   # blood per rectum- with stool- sec to hemorrhoids monitor for now- stable.   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 Recommend gabapentin.   # hypokalemia-]likely from diarrhea].-continue  K-Dur twice a day --stable.  # diarrhea -G-1-2- f[rom chemo- metformin - STOPPED metformin ]-IMPROVED  Imodium prn/ ADDlomotil prn.      .  # GERD:  on PPI- stable.   # HTN:  stable.    # Hx of Borderline DM-  s/p nutrition- STOPPED   metformin  1000 mg ER/ [sec to Diarrhea]- add glipizide  5 mg XL /day- stable.  # BRCA-positive- prophylactic contralateral mastectomy.  Consider bilateral prophylactic oophorectomy down the line.  Patient interested in mastectomy.  # Social- FMLA/disability-    PS-  # DISPOSITION: # as per IS- chemo today--; D-2 injection # follow up in 3 weeks- MD; labs- chemo; D-2 injection-  # follow up in 6/29th weeks- MD; labs- chemo; D-2 injection-Dr.B

## 2024-01-10 NOTE — Progress Notes (Signed)
 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   # JULY-AUG 2025- RIGHT BREAST T2; intramammary lymph node positive- N-1- ER-PR POSITIVE: her 2 neg; Ki-67-50%; right breast cancer with T2N1- ER/PR + her 2 NEG breast cancer.   # AUG 14th, 2025- carbo-Taxol - -HOLD taxol  cycle #12 today-sec to PN/fatigue-and also mild anemia. NOV 12th, 2025- start [facility] q 3 W  # Colonoscopy [Dr.sankar-Neg June 2017]  # Smoker; BRCA-1 positive   Primary cancer of right upper lobe of lung (HCC)  10/26/2015 Initial Diagnosis   Primary cancer of right upper lobe of lung (HCC)   Carcinoma of upper-outer quadrant of right breast in female, estrogen receptor positive (HCC)  09/12/2023 Initial Diagnosis   Carcinoma of upper-outer quadrant of right breast in female, estrogen receptor positive (HCC)   09/12/2023 Cancer Staging   Staging form: Breast, AJCC 8th Edition - Clinical: Stage IIA (cT2, cN1, cM0, G2, ER+, PR+, HER2-) - Signed by Rennie Cindy SAUNDERS, MD on 09/29/2023 Histologic grading system: 3 grade system   10/12/2023 -  Chemotherapy   Patient is on Treatment Plan : BREAST Paclitaxel  q7d / AC q21d       HISTORY OF PRESENTING ILLNESS: Patient ambulating-independently. ALONE>     History of Present Illness    Connie Osborne is a 63 year old female with BRCA1 related breast cancer who presents with worsening  fatigue and neuropathy.  Discussed the use of AI scribe software for clinical note transcription with the patient, who gave verbal consent to proceed.  History of Present Illness   Connie Osborne is a 63 year old female with breast cancer on chemotherapy who presents with neuropathic pain and fatigue.  She has experienced significant improvement in her overall condition since her chemotherapy treatment was held last week. However, she continues to suffer from severe neuropathic pain, particularly in her feet, described as a 'burning hurt' that intensifies at night. She rates this pain as a 10 out of 10 on the pain scale. The pain in her fingertips has improved after taking a few days off work.  She is currently undergoing chemotherapy for breast cancer.  Taxol  was held last week because of ongoing neuropathy and worsening fatigue.  She experiences significant fatigue, which is exacerbated by her 12-hour weekend work shifts. She is concerned about whether she will need to stop working due to the increasing fatigue.  She reports joint pain and body aches, which she attributes to the chemotherapy.          Review of Systems  Constitutional:  Positive for malaise/fatigue. Negative for chills, diaphoresis, fever and weight loss.  HENT:  Negative for nosebleeds and sore throat.   Eyes:  Negative for double vision.  Respiratory:  Negative for cough, hemoptysis, sputum production, shortness of breath and wheezing.   Cardiovascular:  Negative for chest pain, palpitations, orthopnea and leg swelling.  Gastrointestinal:  Negative for abdominal pain, blood in stool, constipation, diarrhea, heartburn, melena, nausea  and vomiting.  Genitourinary:  Negative for dysuria, frequency and urgency.  Musculoskeletal:  Positive for back pain and joint pain.  Skin: Negative.  Negative for itching and rash.  Neurological:  Negative for dizziness, tingling, focal weakness, weakness and headaches.   Endo/Heme/Allergies:  Does not bruise/bleed easily.  Psychiatric/Behavioral:  Negative for depression. The patient is not nervous/anxious and does not have insomnia.     MEDICAL HISTORY:  Past Medical History:  Diagnosis Date   Allergy    Anxiety    Arthritis    Breast cancer (HCC) 2014   Left- Radiation; BRCA 1 +    COPD (chronic obstructive pulmonary disease) (HCC)    Coughing up blood    GERD (gastroesophageal reflux disease)    Hypertension    Lung cancer (HCC)    Carcinoma, right upper lobe    Personal history of radiation therapy    Pre-diabetes    Shortness of breath dyspnea    Vitamin D deficiency     SURGICAL HISTORY: Past Surgical History:  Procedure Laterality Date   ABDOMINAL HYSTERECTOMY     BREAST BIOPSY Left 2014   +   BREAST BIOPSY Left 06/11/2019   stereo bx, x-clip, negative   BREAST BIOPSY Right 08/30/2023   US  RT BREAST BX W LOC DEV 1ST LESION IMG BX SPEC US  GUIDE 08/30/2023 [facility]   BREAST BIOPSY Right 08/30/2023   US  RT BREAST BX W LOC DEV EA ADD LESION IMG BX SPEC US  GUIDE 08/30/2023 [facility]   BREAST EXCISIONAL BIOPSY Left 2014   BREAST LUMPECTOMY Left 2014   BREAST SURGERY Left 2014   lumpectomy   COLONOSCOPY WITH PROPOFOL  N/A 08/18/2015   Procedure: COLONOSCOPY WITH PROPOFOL ;  Surgeon: Louanne KANDICE Muse, MD;  Location: [facility];  Service: Endoscopy;  Laterality: N/A;   COLONOSCOPY WITH PROPOFOL  N/A 10/12/2016   Procedure: COLONOSCOPY WITH PROPOFOL ;  Surgeon: Muse Louanne KANDICE, MD;  Location: [facility];  Service: Endoscopy;  Laterality: N/A;   ELECTROMAGNETIC NAVIGATION BROCHOSCOPY Right 07/28/2015   Procedure: ELECTROMAGNETIC NAVIGATION BRONCHOSCOPY;  Surgeon: Nickolas Cellar, MD;  Location: [facility];  Service: Cardiopulmonary;  Laterality: Right;   FOOT SURGERY     FRACTURE SURGERY     GANGLION CYST EXCISION     PORTACATH PLACEMENT N/A 10/02/2023   Procedure: INSERTION, TUNNELED CENTRAL VENOUS DEVICE, WITH PORT;   Surgeon: Rodolph Romano, MD;  Location: [facility];  Service: General;  Laterality: N/A;   THORACOTOMY/LOBECTOMY Right 09/14/2015   Procedure: THORACOTOMY/LOBECTOMY;  Surgeon: Louanne KANDICE Muse, MD;  Location: [facility];  Service: Thoracic;  Laterality: Right;    SOCIAL HISTORY: Social History   Socioeconomic History   Marital status: Single    Spouse name: Not on file   Number of children: Not on file   Years of education: Not on file   Highest education level: Not on file  Occupational History   Not on file  Tobacco Use   Smoking status: Former    Current packs/day: 0.00    Average packs/day: 1 pack/day for 30.0 years (30.0 ttl pk-yrs)    Types: Cigarettes    Start date: 07/29/1985    Quit date: 07/30/2015    Years since quitting: 8.4   Smokeless tobacco: Never  Vaping Use   Vaping status: Never Used  Substance and Sexual Activity   Alcohol use: No    Alcohol/week: 0.0 standard drinks of alcohol   Drug use: No   Sexual activity: Not on file  Other Topics Concern   Not  on file  Social History Narrative   Lives alone   Social Drivers of Health   Financial Resource Strain: Medium Risk (09/07/2023)   Received from [facility]   Overall Financial Resource Strain (CARDIA)    Difficulty of Paying Living Expenses: Somewhat hard  Food Insecurity: No Food Insecurity (09/12/2023)   Hunger Vital Sign    Worried About Running Out of Food in the Last Year: Never true    Ran Out of Food in the Last Year: Never true  Recent Concern: Food Insecurity - Food Insecurity Present (09/07/2023)   Received from [facility]   Hunger Vital Sign    Within the past 12 months, you worried that your food would run out before you got the money to buy more.: Sometimes true    Within the past 12 months, the food you bought just didn't last and you didn't have money to get more.: Never true  Transportation Needs: No Transportation Needs (09/12/2023)   PRAPARE -  Administrator, Civil Service (Medical): No    Lack of Transportation (Non-Medical): No  Physical Activity: Not on file  Stress: Not on file  Social Connections: Not on file  Intimate Partner Violence: Not At Risk (09/12/2023)   Humiliation, Afraid, Rape, and Kick questionnaire    Fear of Current or Ex-Partner: No    Emotionally Abused: No    Physically Abused: No    Sexually Abused: No    FAMILY HISTORY: Family History  Problem Relation Age of Onset   Hypertension Mother    Diabetes Mellitus II Mother    Cancer Mother        mets   Cancer Father        long cancer   Cancer Sister 33       breast   Breast cancer Sister 68   Cancer Sister 68       breast   Breast cancer Sister 67   Lung cancer Brother    Cancer Other        breast    ALLERGIES:  is allergic to accupril [quinapril hcl] and percocet [oxycodone -acetaminophen ].  MEDICATIONS:  Current Outpatient Medications  Medication Sig Dispense Refill   amLODipine  (NORVASC ) 10 MG tablet Take 10 mg by mouth daily with lunch.      Calcium Carb-Cholecalciferol (CALCIUM + VITAMIN D3 PO) Take 1 tablet by mouth in the morning and at bedtime.     cetirizine (ZYRTEC) 10 MG tablet Take 10 mg by mouth as needed for allergies.     diphenoxylate -atropine  (LOMOTIL ) 2.5-0.025 MG tablet Take 1 tablet by mouth 4 (four) times daily as needed for diarrhea or loose stools. Take it along with immodium 60 tablet 0   famotidine  (PEPCID ) 20 MG tablet Take 20 mg by mouth daily as needed for heartburn or indigestion.     gabapentin (NEURONTIN) 100 MG capsule Take gabapentin 2 pills at nighttime; and if tolerating well-start taking 1 pill in the morning; and 1 pill in the afternoon. 120 capsule 1   glipiZIDE  (GLUCOTROL  XL) 5 MG 24 hr tablet Take 1 tablet (5 mg total) by mouth daily with breakfast. 30 tablet 6   lidocaine -prilocaine  (EMLA ) cream Apply on the port. 30 -45 min  prior to port access. 30 g 3   losartan  (COZAAR ) 100 MG tablet  Take 100 mg by mouth daily with lunch.      metFORMIN  (GLUCOPHAGE ) 500 MG tablet Take 500 mg by mouth 2 (two)  times daily with a meal.     metoprolol  succinate (TOPROL -XL) 25 MG 24 hr tablet Take 25 mg by mouth daily with lunch.      ondansetron  (ZOFRAN ) 8 MG tablet One pill every 8 hours as needed for nausea/vomitting. 40 tablet 1   Potassium Chloride  ER 20 MEQ TBCR Take 1 tablet (20 mEq total) by mouth 2 (two) times daily. 120 tablet 1   prochlorperazine  (COMPAZINE ) 10 MG tablet Take 1 tablet (10 mg total) by mouth every 6 (six) hours as needed for nausea or vomiting. 30 tablet 0   traMADol  (ULTRAM ) 50 MG tablet Take 1 tablet (50 mg total) by mouth every 6 (six) hours as needed. 10 tablet 0   No current facility-administered medications for this visit.   Facility-Administered Medications Ordered in Other Visits  Medication Dose Route Frequency Provider Last Rate Last Admin   0.9 %  sodium chloride  infusion   Intravenous Continuous Rennie, Wayne Brunker R, MD       cyclophosphamide (CYTOXAN) 1,160 mg in sodium chloride  0.9 % 250 mL chemo infusion  600 mg/m2 (Treatment Plan Recorded) Intravenous Once Paizlee Kinder R, MD       dexamethasone  (DECADRON ) injection 10 mg  10 mg Intravenous Once Porchea Charrier R, MD       DOXOrubicin (ADRIAMYCIN) chemo injection 116 mg  60 mg/m2 (Treatment Plan Recorded) Intravenous Once Burna Atlas R, MD       fosaprepitant  (EMEND) 150 mg in sodium chloride  0.9 % 145 mL IVPB  150 mg Intravenous Once Secily Walthour R, MD       palonosetron  (ALOXI ) injection 0.25 mg  0.25 mg Intravenous Once Dalayah Deahl R, MD        PHYSICAL EXAMINATION:   Vitals:   01/10/24 0819  BP: 133/72  Pulse: 84  Resp: 16  Temp: 98.2 F (36.8 C)  SpO2: 100%   Filed Weights   01/10/24 0819  Weight: 171 lb 14.4 oz (78 kg)   10/1-improvement of the right breast mass.  Vaguely felt-   Physical Exam Vitals and nursing note reviewed.  HENT:     Head:  Normocephalic and atraumatic.     Mouth/Throat:     Pharynx: Oropharynx is clear.  Eyes:     Extraocular Movements: Extraocular movements intact.     Pupils: Pupils are equal, round, and reactive to light.  Cardiovascular:     Rate and Rhythm: Normal rate and regular rhythm.  Pulmonary:     Comments: Decreased breath sounds bilaterally.  Abdominal:     Palpations: Abdomen is soft.  Musculoskeletal:        General: Normal range of motion.     Cervical back: Normal range of motion.  Skin:    General: Skin is warm.  Neurological:     General: No focal deficit present.     Mental Status: She is alert and oriented to person, place, and time.  Psychiatric:        Behavior: Behavior normal.        Judgment: Judgment normal.     LABORATORY DATA:  I have reviewed the data as listed Lab Results  Component Value Date   WBC 3.9 (L) 01/10/2024   HGB 9.2 (L) 01/10/2024   HCT 28.2 (L) 01/10/2024   MCV 97.6 01/10/2024   PLT 362 01/10/2024   Recent Labs    12/27/23 0947 01/03/24 0922 01/10/24 0827  NA 138 139 141  K 3.5 3.6 3.3*  CL 105 108 109  CO2  25 24 23   GLUCOSE 133* 112* 184*  BUN 8 8 10   CREATININE 0.55 0.48 0.61  CALCIUM 9.1 8.8* 8.8*  GFRNONAA >60 >60 >60  PROT 6.5 6.3* 6.5  ALBUMIN 3.6 3.4* 3.5  AST 18 23 23   ALT 16 27 13   ALKPHOS 90 59 61  BILITOT 0.4 0.5 0.3    RADIOGRAPHIC STUDIES: I have personally reviewed the radiological images as listed and agreed with the findings in the report. MM DIAG BREAST TOMO UNI RIGHT Result Date: 12/15/2023 CLINICAL DATA:  Tumor check status post neoadjuvant chemotherapy. History of RIGHT breast invasive ductal carcinoma segments September 05, 2023. Patient underwent ultrasound-guided biopsy of a RIGHT breast mass at 1 o'clock (RIBBON clip) as well as an intramammary lymph node (COIL clip) which demonstrated invasive ductal carcinoma. Staging MRI demonstrated an enlarged level 1 RIGHT axillary lymph node it was subsequently biopsy and  demonstrated metastatic disease (HYDROMARK). History of a LEFT breast cancer in 2014 with a benign stereotactic guided biopsy in 2021 of calcifications. EXAM: DIGITAL DIAGNOSTIC UNILATERAL RIGHT MAMMOGRAM WITH TOMOSYNTHESIS AND CAD; ULTRASOUND RIGHT BREAST LIMITED TECHNIQUE: Right digital diagnostic mammography and breast tomosynthesis was performed. The images were evaluated with computer-aided detection. ; Targeted ultrasound examination of the right breast was performed COMPARISON:  Previous exam(s). ACR Breast Density Category b: There are scattered areas of fibroglandular density. FINDINGS: Diagnostic images of the RIGHT breast demonstrate an irregular mass with associated RIBBON shaped biopsy marking clip in the RIGHT upper breast at posterior depth. Spot magnification views demonstrate increased anteriorly extending calcifications compared to prior. They span at least 15 mm in anterior-posterior extent, previously approximately 5 mm. These could only be assessed on MLO view due to positioning and patient tolerance. Mass itself is decreased in size compared to prior. Decreased prominence of an intramammary lymph node is noted in the RIGHT upper outer breast with associated COIL shaped biopsy marking clip. A second non sampled adjacent intramammary lymph node has also decreased in size compared to prior. Two additional RIGHT axillary lymph nodes seen on MLO view slice 74 have decreased since prior as well. HYDROMARK clip is noted in the RIGHT axilla in association with a minimally decreased size of a RIGHT axillary lymph node. Targeted ultrasound was performed of the RIGHT breast. At 1 o'clock 12 cm from nipple there is revisualization of an irregular hypoechoic mass. It measures 22 x 24 x 15 mm, previously 37 x 30 x 34 mm. It demonstrates an associated echogenic focus consistent with biopsy marking clip. Targeted ultrasound was performed of the RIGHT upper outer breast. Intramammary lymph nodes have decreased  in size compared to prior. They demonstrate preserved echogenic hila and thin smooth cortices measuring approximately 1 mm in thickness, previously 3 mm. Targeted ultrasound was performed of the RIGHT axilla. Previously sampled level 1 RIGHT axillary lymph node now demonstrates normal morphology with a thin smooth hilum, thinner compared to prior. HYDROMARK biopsy marking clip is noted to be immediately adjacent to the axillary lymph node. IMPRESSION: 1. Decrease in size of a biopsy-proven RIGHT breast invasive ductal carcinoma from 37 mm to 24 mm. Increased adjacent calcifications likely reflect the sequela of treatment response. If breast conservation surgery is pursued, recommend bracketed excision of adjacent calcifications. 2. Decrease in size of multiple intramammary lymph nodes, consistent with treatment response. They are now morphologically normal in appearance 3. Decrease in size of the biopsy-proven metastatic RIGHT axillary lymph node. It now morphologically normal in appearance. Of note, HYDROMARK biopsy marking clip  is immediately adjacent to the RIGHT axillary lymph node. RECOMMENDATION: Recommend continued clinical management of biopsy proven malignancy I have discussed the findings and recommendations with the patient. If applicable, a reminder letter will be sent to the patient regarding the next appointment. BI-RADS CATEGORY  6: Known biopsy-proven malignancy. Electronically Signed   By: Corean Salter M.D.   On: 12/15/2023 12:57   US  Breast Limited Uni Right Inc Axilla Result Date: 12/15/2023 CLINICAL DATA:  Tumor check status post neoadjuvant chemotherapy. History of RIGHT breast invasive ductal carcinoma segments September 05, 2023. Patient underwent ultrasound-guided biopsy of a RIGHT breast mass at 1 o'clock (RIBBON clip) as well as an intramammary lymph node (COIL clip) which demonstrated invasive ductal carcinoma. Staging MRI demonstrated an enlarged level 1 RIGHT axillary lymph node it  was subsequently biopsy and demonstrated metastatic disease (HYDROMARK). History of a LEFT breast cancer in 2014 with a benign stereotactic guided biopsy in 2021 of calcifications. EXAM: DIGITAL DIAGNOSTIC UNILATERAL RIGHT MAMMOGRAM WITH TOMOSYNTHESIS AND CAD; ULTRASOUND RIGHT BREAST LIMITED TECHNIQUE: Right digital diagnostic mammography and breast tomosynthesis was performed. The images were evaluated with computer-aided detection. ; Targeted ultrasound examination of the right breast was performed COMPARISON:  Previous exam(s). ACR Breast Density Category b: There are scattered areas of fibroglandular density. FINDINGS: Diagnostic images of the RIGHT breast demonstrate an irregular mass with associated RIBBON shaped biopsy marking clip in the RIGHT upper breast at posterior depth. Spot magnification views demonstrate increased anteriorly extending calcifications compared to prior. They span at least 15 mm in anterior-posterior extent, previously approximately 5 mm. These could only be assessed on MLO view due to positioning and patient tolerance. Mass itself is decreased in size compared to prior. Decreased prominence of an intramammary lymph node is noted in the RIGHT upper outer breast with associated COIL shaped biopsy marking clip. A second non sampled adjacent intramammary lymph node has also decreased in size compared to prior. Two additional RIGHT axillary lymph nodes seen on MLO view slice 74 have decreased since prior as well. HYDROMARK clip is noted in the RIGHT axilla in association with a minimally decreased size of a RIGHT axillary lymph node. Targeted ultrasound was performed of the RIGHT breast. At 1 o'clock 12 cm from nipple there is revisualization of an irregular hypoechoic mass. It measures 22 x 24 x 15 mm, previously 37 x 30 x 34 mm. It demonstrates an associated echogenic focus consistent with biopsy marking clip. Targeted ultrasound was performed of the RIGHT upper outer breast. Intramammary  lymph nodes have decreased in size compared to prior. They demonstrate preserved echogenic hila and thin smooth cortices measuring approximately 1 mm in thickness, previously 3 mm. Targeted ultrasound was performed of the RIGHT axilla. Previously sampled level 1 RIGHT axillary lymph node now demonstrates normal morphology with a thin smooth hilum, thinner compared to prior. HYDROMARK biopsy marking clip is noted to be immediately adjacent to the axillary lymph node. IMPRESSION: 1. Decrease in size of a biopsy-proven RIGHT breast invasive ductal carcinoma from 37 mm to 24 mm. Increased adjacent calcifications likely reflect the sequela of treatment response. If breast conservation surgery is pursued, recommend bracketed excision of adjacent calcifications. 2. Decrease in size of multiple intramammary lymph nodes, consistent with treatment response. They are now morphologically normal in appearance 3. Decrease in size of the biopsy-proven metastatic RIGHT axillary lymph node. It now morphologically normal in appearance. Of note, HYDROMARK biopsy marking clip is immediately adjacent to the RIGHT axillary lymph node. RECOMMENDATION: Recommend continued clinical  management of biopsy proven malignancy I have discussed the findings and recommendations with the patient. If applicable, a reminder letter will be sent to the patient regarding the next appointment. BI-RADS CATEGORY  6: Known biopsy-proven malignancy. Electronically Signed   By: Corean Salter M.D.   On: 12/15/2023 12:57      Carcinoma of upper-outer quadrant of right breast in female, estrogen receptor positive (HCC) Any more questions for me thanks # RIGHT breast- INVASIVE DUCTAL CARCINOMA WITH PAPILLARY FEATURES- T2 [3.7cm] & positive IMC intramammary lymph node-  cN0-positive for LVI ; grade 2 -ER 95% PR 80% HER2/neu 1+/negative. Ki-67 50%- ONE LYMPH NODE, POSITIVE FOR METASTATIC CARCINOMA (1/1); METASTATIC FOCUS: 4 MM; SUSPICIOUS FOR EXTRANODAL  EXTENSION Dr.Cintron- ONCOTYPE- 28. Discussed the role of endocrine therapy-given ER/PR positive disease postsurgery; antihormone pill 1 a day for 10 years;  adjuvant Zometa. Also consider CDK- inhibitors/ PARP inhibitors post surgery. Given multifocality-large size of the tumor; given BRCA 1 positive- PLAN with mastectomy with contralateral prophylactic mastectomy. AUG 18th, 2025- [facility] scan-ejection fraction of 56%.# ON neo-adjuvant chemo- taxol  weekly- with carbo q 3W; and followed by  Adriamycin-Cytoxan every 3  weeks x 4 cycles. OCT 17th, 2025- Decrease in size of a biopsy-proven RIGHT breast invasive ductal carcinoma from 37 mm to 24 mm.  Also improvement of the right axilla lymphadenopathy.  # CURRENTLY ON neo-adjuvant chemo- proceed with AC chemotherapy every 3 weeks- cycle #1 today- Labs-CBC/chemistries were reviewed with the patient. Growth factor would be given as prophylaxis for chemotherapy-induced neutropenia to prevent febrile neutropenias. Discussed potential side effect- myalgias/arthralgias- # To help prevent pain from the booster injection recommend Claritin  a day for 7 days; and also can take tylenol /advil as needed.   # blood per rectum- with stool- sec to hemorrhoids monitor for now- stable.   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 Recommend gabapentin.   # hypokalemia-]likely from diarrhea].-continue  K-Dur twice a day --stable.  # diarrhea -G-1-2- f[rom chemo- metformin - STOPPED metformin ]-IMPROVED  Imodium prn/ ADDlomotil prn.      .  # GERD:  on PPI- stable.   # HTN:  stable.    # Hx of Borderline DM-  s/p nutrition- STOPPED  metformin  1000 mg ER/ [sec to Diarrhea]- add glipizide  5 mg XL /day- stable.  # BRCA-positive- prophylactic contralateral mastectomy.  Consider bilateral prophylactic oophorectomy down the line.  Patient interested in mastectomy.  # Social- FMLA/disability-    PS-  # DISPOSITION: # as per IS- chemo today--; D-2 injection # follow  up in 3 weeks- MD; labs- chemo; D-2 injection-  # follow up in 6/29th weeks- MD; labs- chemo; D-2 injection-Dr.B      Cindy JONELLE Joe, MD 01/10/2024 9:23 AM

## 2024-01-10 NOTE — Patient Instructions (Signed)
 [facility] - A DEPT OF [place]  Discharge Instructions: Thank you for choosing [facility] to provide your oncology and hematology care.  If you have a lab appointment with the Cancer Center, please go directly to the Cancer Center and check in at the registration area.  Wear comfortable clothing and clothing appropriate for easy access to any Portacath or PICC line.   We strive to give you quality time with your provider. You may need to reschedule your appointment if you arrive late (15 or more minutes).  Arriving late affects you and other patients whose appointments are after yours.  Also, if you miss three or more appointments without notifying the office, you may be dismissed from the clinic at the provider's discretion.      For prescription refill requests, have your pharmacy contact our office and allow 72 hours for refills to be completed.    Today you received the following chemotherapy and/or immunotherapy agents- adriamycin, cyclophosphamide      To help prevent nausea and vomiting after your treatment, we encourage you to take your nausea medication as directed.  BELOW ARE SYMPTOMS THAT SHOULD BE REPORTED IMMEDIATELY: *FEVER GREATER THAN 100.4 F (38 C) OR HIGHER *CHILLS OR SWEATING *NAUSEA AND VOMITING THAT IS NOT CONTROLLED WITH YOUR NAUSEA MEDICATION *UNUSUAL SHORTNESS OF BREATH *UNUSUAL BRUISING OR BLEEDING *URINARY PROBLEMS (pain or burning when urinating, or frequent urination) *BOWEL PROBLEMS (unusual diarrhea, constipation, pain near the anus) TENDERNESS IN MOUTH AND THROAT WITH OR WITHOUT PRESENCE OF ULCERS (sore throat, sores in mouth, or a toothache) UNUSUAL RASH, SWELLING OR PAIN  UNUSUAL VAGINAL DISCHARGE OR ITCHING   Items with * indicate a potential emergency and should be followed up as soon as possible or go to the Emergency Department if any problems should occur.  Please show the CHEMOTHERAPY ALERT CARD  or IMMUNOTHERAPY ALERT CARD at check-in to the Emergency Department and triage nurse.  Should you have questions after your visit or need to cancel or reschedule your appointment, please contact [facility] - A DEPT OF [facility]  [phone] and follow the prompts.  Office hours are 8:00 a.m. to 4:30 p.m. Monday - Friday. Please note that voicemails left after 4:00 p.m. may not be returned until the following business day.  We are closed weekends and major holidays. You have access to a nurse at all times for urgent questions. Please call the main number to the clinic [phone] and follow the prompts.  For any non-urgent questions, you may also contact your provider using MyChart. We now offer e-Visits for anyone 12 and older to request care online for non-urgent symptoms. For details visit [email or web address].   Also download the MyChart app! Go to the app store, search MyChart, open the app, select [place], and log in with your MyChart username and password.

## 2024-01-10 NOTE — Patient Instructions (Signed)
  VISIT SUMMARY: During your visit, we discussed your ongoing chemotherapy treatment for breast cancer and addressed your concerns about neuropathic pain, fatigue, and joint pain. We reviewed your current medications and made some adjustments to help manage your symptoms.  YOUR PLAN: -BREAST CANCER: You are undergoing chemotherapy for breast cancer, which is necessary for your treatment. We administered a booster injection to help with your white blood cell count and discussed dietary changes to prevent mouth sores. Please avoid spicy foods and use salt and baking soda rinses. Use a soft toothbrush to avoid irritation. Continue your chemotherapy regimen and communicate with the clinic if your symptoms worsen.  -CHEMOTHERAPY-INDUCED PERIPHERAL NEUROPATHY: You are experiencing severe burning pain in your feet at night due to nerve damage from chemotherapy. We have prescribed gabapentin to help manage this pain. Start with two pills at night, and if you tolerate it well, you can add one pill in the morning and one in the afternoon. Be aware of potential side effects like dizziness and drowsiness.  -CHEMOTHERAPY-INDUCED FATIGUE: You are feeling very tired, especially after long work shifts, which is a common side effect of chemotherapy. We discussed the importance of rest and self-care. You may need to reduce your work hours or take some time off to manage your fatigue better.  -BOOSTER INJECTION-INDUCED JOINT PAIN: You cold have joint pain likely caused by booster injection We recommend taking Claritin  once daily to help relieve this pain.  INSTRUCTIONS: Please follow up with the clinic if your symptoms worsen or if you have any concerns about your treatment. Make sure to rest and take care of yourself, and consider adjusting your work hours if needed to manage your fatigue.                      Contains text generated by Abridge.                                  Contains text generated by Abridge.

## 2024-01-10 NOTE — Progress Notes (Signed)
 C/o feet hurting, hands/tips of fingers sore.

## 2024-01-11 ENCOUNTER — Inpatient Hospital Stay

## 2024-01-11 ENCOUNTER — Other Ambulatory Visit: Payer: Self-pay

## 2024-01-11 DIAGNOSIS — C50411 Malignant neoplasm of upper-outer quadrant of right female breast: Secondary | ICD-10-CM

## 2024-01-11 DIAGNOSIS — Z5111 Encounter for antineoplastic chemotherapy: Secondary | ICD-10-CM | POA: Diagnosis not present

## 2024-01-11 MED ORDER — PEGFILGRASTIM-JMDB 6 MG/0.6ML ~~LOC~~ SOSY
6.0000 mg | PREFILLED_SYRINGE | Freq: Once | SUBCUTANEOUS | Status: AC
  Administered 2024-01-11: 6 mg via SUBCUTANEOUS
  Filled 2024-01-11: qty 0.6

## 2024-01-15 ENCOUNTER — Encounter: Payer: Self-pay | Admitting: Internal Medicine

## 2024-01-15 ENCOUNTER — Telehealth: Payer: Self-pay

## 2024-01-15 ENCOUNTER — Inpatient Hospital Stay: Admitting: Hospice and Palliative Medicine

## 2024-01-15 ENCOUNTER — Encounter: Payer: Self-pay | Admitting: Hospice and Palliative Medicine

## 2024-01-15 ENCOUNTER — Other Ambulatory Visit: Payer: Self-pay | Admitting: *Deleted

## 2024-01-15 ENCOUNTER — Encounter: Payer: Self-pay | Admitting: *Deleted

## 2024-01-15 ENCOUNTER — Inpatient Hospital Stay

## 2024-01-15 ENCOUNTER — Other Ambulatory Visit: Payer: Self-pay

## 2024-01-15 VITALS — BP 137/70 | HR 90 | Temp 98.5°F | Resp 22 | Ht 65.0 in | Wt 172.5 lb

## 2024-01-15 DIAGNOSIS — C50411 Malignant neoplasm of upper-outer quadrant of right female breast: Secondary | ICD-10-CM

## 2024-01-15 DIAGNOSIS — R5383 Other fatigue: Secondary | ICD-10-CM

## 2024-01-15 DIAGNOSIS — R300 Dysuria: Secondary | ICD-10-CM | POA: Diagnosis not present

## 2024-01-15 DIAGNOSIS — Z5111 Encounter for antineoplastic chemotherapy: Secondary | ICD-10-CM | POA: Diagnosis not present

## 2024-01-15 LAB — URINALYSIS, COMPLETE (UACMP) WITH MICROSCOPIC
Bacteria, UA: NONE SEEN
Bilirubin Urine: NEGATIVE
Glucose, UA: NEGATIVE mg/dL
Hgb urine dipstick: NEGATIVE
Ketones, ur: NEGATIVE mg/dL
Leukocytes,Ua: NEGATIVE
Nitrite: NEGATIVE
Protein, ur: NEGATIVE mg/dL
RBC / HPF: 0 RBC/hpf (ref 0–5)
Specific Gravity, Urine: 1.004 — ABNORMAL LOW (ref 1.005–1.030)
pH: 8 (ref 5.0–8.0)

## 2024-01-15 LAB — CBC WITH DIFFERENTIAL (CANCER CENTER ONLY)
Abs Immature Granulocytes: 0.2 K/uL — ABNORMAL HIGH (ref 0.00–0.07)
Basophils Absolute: 0 K/uL (ref 0.0–0.1)
Basophils Relative: 0 %
Eosinophils Absolute: 0.1 K/uL (ref 0.0–0.5)
Eosinophils Relative: 1 %
HCT: 27.6 % — ABNORMAL LOW (ref 36.0–46.0)
Hemoglobin: 9.2 g/dL — ABNORMAL LOW (ref 12.0–15.0)
Lymphocytes Relative: 12 %
Lymphs Abs: 0.9 K/uL (ref 0.7–4.0)
MCH: 32.2 pg (ref 26.0–34.0)
MCHC: 33.3 g/dL (ref 30.0–36.0)
MCV: 96.5 fL (ref 80.0–100.0)
Metamyelocytes Relative: 1 %
Monocytes Absolute: 0.2 K/uL (ref 0.1–1.0)
Monocytes Relative: 2 %
Myelocytes: 1 %
Neutro Abs: 6.2 K/uL (ref 1.7–7.7)
Neutrophils Relative %: 83 %
Platelet Count: 183 K/uL (ref 150–400)
RBC: 2.86 MIL/uL — ABNORMAL LOW (ref 3.87–5.11)
RDW: 16.1 % — ABNORMAL HIGH (ref 11.5–15.5)
Smear Review: NORMAL
WBC Count: 7.5 K/uL (ref 4.0–10.5)
nRBC: 0 % (ref 0.0–0.2)

## 2024-01-15 LAB — CMP (CANCER CENTER ONLY)
ALT: 10 U/L (ref 0–44)
AST: 25 U/L (ref 15–41)
Albumin: 3.4 g/dL — ABNORMAL LOW (ref 3.5–5.0)
Alkaline Phosphatase: 100 U/L (ref 38–126)
Anion gap: 10 (ref 5–15)
BUN: 9 mg/dL (ref 8–23)
CO2: 25 mmol/L (ref 22–32)
Calcium: 9.3 mg/dL (ref 8.9–10.3)
Chloride: 102 mmol/L (ref 98–111)
Creatinine: 0.44 mg/dL (ref 0.44–1.00)
GFR, Estimated: >60 mL/min (ref >60)
Glucose, Bld: 182 mg/dL — ABNORMAL HIGH (ref 70–99)
Potassium: 3.3 mmol/L — ABNORMAL LOW (ref 3.5–5.1)
Sodium: 137 mmol/L (ref 135–145)
Total Bilirubin: 0.7 mg/dL (ref 0.0–1.2)
Total Protein: 6.4 g/dL — ABNORMAL LOW (ref 6.5–8.1)

## 2024-01-15 MED ORDER — STERILE WATER FOR INJECTION IJ SOLN
5.0000 mL | Freq: Four times a day (QID) | OROMUCOSAL | 3 refills | Status: AC
  Filled 2024-01-15: qty 480, 24d supply, fill #0

## 2024-01-15 MED ORDER — MAGIC MOUTHWASH W/LIDOCAINE
5.0000 mL | Freq: Four times a day (QID) | ORAL | 3 refills | Status: AC
Start: 2024-01-15 — End: ?

## 2024-01-15 NOTE — Telephone Encounter (Signed)
 Labs entered per [facility]- cbc/metc

## 2024-01-15 NOTE — Progress Notes (Signed)
 No IVF today

## 2024-01-15 NOTE — Progress Notes (Signed)
 Pt given work note. She works Sat, Sunday and Monday. She stated she only need a work note for today.

## 2024-01-15 NOTE — Telephone Encounter (Signed)
 Patient woke up this am feeling weak, not able to take usual am shower without feeling fatigued.  Mouth started feeling sore yesterday with no visible sores and has been trying salt water gargle with no relief.  Denies any other specific symptoms states she just feels sick.

## 2024-01-15 NOTE — Progress Notes (Signed)
 [facility] at [facility] [phone] [phone]  Patient Care Team: Jacques Garre, NP as PCP - General (Nurse Practitioner) Dellie Louanne MATSU, MD (General Surgery) Nancylee Duel, MD (Inactive) (Hematology and Oncology) Rennie Cindy SAUNDERS, MD as Consulting Physician (Oncology) Georgina Shasta POUR, RN as Oncology Nurse Navigator   NAME OF PATIENT: Connie Osborne  [id number]  [birth year]   DATE OF VISIT: 01/15/24  REASON FOR CONSULT: Connie Osborne is a 63 y.o. 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   INTERVAL HISTORY: Patient on neoadjuvant AC chemotherapy.  Cycle 5 on 01/10/2024.  Patient presents to [facility] with complaint of feeling weak, fatigued, and oral pain.  Patient describes symptoms generally occurring 3 to 4 days following chemotherapy.  This time, symptoms started on Saturday.  Patient endorses poor oral intake.  Denies fever or chills.  No respiratory or GI symptoms.  Patient does endorse urinary frequency but denies dysuria.  Denies any neurologic complaints. Denies recent fevers or illnesses. Denies any easy bleeding or bruising. Denies chest pain. Denies any nausea, vomiting, constipation, or diarrhea. Patient offers no further specific complaints today.   PAST MEDICAL HISTORY: Past Medical History:  Diagnosis Date   Allergy    Anxiety    Arthritis    Breast cancer (HCC) 2014   Left- Radiation; BRCA 1 +    COPD (chronic obstructive pulmonary disease) (HCC)    Coughing up blood    GERD (gastroesophageal reflux disease)    Hypertension    Lung cancer (HCC)    Carcinoma, right upper lobe    Personal history of radiation therapy    Pre-diabetes    Shortness of breath dyspnea    Vitamin D deficiency     PAST SURGICAL HISTORY:  Past Surgical History:  Procedure Laterality Date   ABDOMINAL HYSTERECTOMY     BREAST  BIOPSY Left 2014   +   BREAST BIOPSY Left 06/11/2019   stereo bx, x-clip, negative   BREAST BIOPSY Right 08/30/2023   US  RT BREAST BX W LOC DEV 1ST LESION IMG BX SPEC US  GUIDE 08/30/2023 [facility]   BREAST BIOPSY Right 08/30/2023   US  RT BREAST BX W LOC DEV EA ADD LESION IMG BX SPEC US  GUIDE 08/30/2023 [facility]   BREAST EXCISIONAL BIOPSY Left 2014   BREAST LUMPECTOMY Left 2014   BREAST SURGERY Left 2014   lumpectomy   COLONOSCOPY WITH PROPOFOL  N/A 08/18/2015   Procedure: COLONOSCOPY WITH PROPOFOL ;  Surgeon: Louanne MATSU Dellie, MD;  Location: [facility];  Service: Endoscopy;  Laterality: N/A;   COLONOSCOPY WITH PROPOFOL  N/A 10/12/2016   Procedure: COLONOSCOPY WITH PROPOFOL ;  Surgeon: Dellie Louanne MATSU, MD;  Location: [facility];  Service: Endoscopy;  Laterality: N/A;   ELECTROMAGNETIC NAVIGATION BROCHOSCOPY Right 07/28/2015   Procedure: ELECTROMAGNETIC NAVIGATION BRONCHOSCOPY;  Surgeon: Nickolas Cellar, MD;  Location: [facility];  Service: Cardiopulmonary;  Laterality: Right;   FOOT SURGERY     FRACTURE SURGERY     GANGLION CYST EXCISION     PORTACATH PLACEMENT N/A 10/02/2023   Procedure: INSERTION, TUNNELED CENTRAL VENOUS DEVICE, WITH PORT;  Surgeon: Rodolph Romano, MD;  Location: [facility];  Service: General;  Laterality: N/A;   THORACOTOMY/LOBECTOMY Right 09/14/2015   Procedure: THORACOTOMY/LOBECTOMY;  Surgeon: Louanne MATSU Dellie, MD;  Location: [facility];  Service: Thoracic;  Laterality: Right;    HEMATOLOGY/ONCOLOGY HISTORY:  Oncology History Overview Note  # 2014-LEFT BREAST DCIS [s/p Lumpec & RT; Drs.Sankar &  Chrystal] Tamoxifen- non-compliance; Breast mammo-NEG [June 2017];   #  June 2017- STAGE I [pT1a pN0] RUL Non-small cell Ca [favor adeno s/p ENB/FNA]; No adj therapy.   # JULY-AUG 2025- RIGHT BREAST T2; intramammary lymph node positive- N-1- ER-PR POSITIVE: her 2 neg; Ki-67-50%; right breast cancer with T2N1- ER/PR + her 2 NEG breast cancer.   #  AUG 14th, 2025- carbo-Taxol - -HOLD taxol  cycle #12 today-sec to PN/fatigue-and also mild anemia. NOV 12th, 2025- start [facility] q 3 W  # Colonoscopy [Dr.sankar-Neg June 2017]  # Smoker; BRCA-1 positive   Primary cancer of right upper lobe of lung (HCC)  10/26/2015 Initial Diagnosis   Primary cancer of right upper lobe of lung (HCC)   Carcinoma of upper-outer quadrant of right breast in female, estrogen receptor positive (HCC)  09/12/2023 Initial Diagnosis   Carcinoma of upper-outer quadrant of right breast in female, estrogen receptor positive (HCC)   09/12/2023 Cancer Staging   Staging form: Breast, AJCC 8th Edition - Clinical: Stage IIA (cT2, cN1, cM0, G2, ER+, PR+, HER2-) - Signed by Rennie Cindy SAUNDERS, MD on 09/29/2023 Histologic grading system: 3 grade system   10/12/2023 -  Chemotherapy   Patient is on Treatment Plan : BREAST Paclitaxel  q7d / AC q21d       ALLERGIES:  is allergic to accupril [quinapril hcl] and percocet [oxycodone -acetaminophen ].  MEDICATIONS:  Current Outpatient Medications  Medication Sig Dispense Refill   amLODipine  (NORVASC ) 10 MG tablet Take 10 mg by mouth daily with lunch.      Calcium Carb-Cholecalciferol (CALCIUM + VITAMIN D3 PO) Take 1 tablet by mouth in the morning and at bedtime.     cetirizine (ZYRTEC) 10 MG tablet Take 10 mg by mouth as needed for allergies.     diphenoxylate -atropine  (LOMOTIL ) 2.5-0.025 MG tablet Take 1 tablet by mouth 4 (four) times daily as needed for diarrhea or loose stools. Take it along with immodium 60 tablet 0   famotidine  (PEPCID ) 20 MG tablet Take 20 mg by mouth daily as needed for heartburn or indigestion.     gabapentin (NEURONTIN) 100 MG capsule Take gabapentin 2 pills at nighttime; and if tolerating well-start taking 1 pill in the morning; and 1 pill in the afternoon. 120 capsule 1   glipiZIDE  (GLUCOTROL  XL) 5 MG 24 hr tablet Take 1 tablet (5 mg total) by mouth daily with breakfast. 30 tablet 6   lidocaine -prilocaine   (EMLA ) cream Apply on the port. 30 -45 min  prior to port access. 30 g 3   losartan  (COZAAR ) 100 MG tablet Take 100 mg by mouth daily with lunch.      metFORMIN  (GLUCOPHAGE ) 500 MG tablet Take 500 mg by mouth 2 (two) times daily with a meal.     metoprolol  succinate (TOPROL -XL) 25 MG 24 hr tablet Take 25 mg by mouth daily with lunch.      ondansetron  (ZOFRAN ) 8 MG tablet One pill every 8 hours as needed for nausea/vomitting. 40 tablet 1   Potassium Chloride  ER 20 MEQ TBCR Take 1 tablet (20 mEq total) by mouth 2 (two) times daily. 120 tablet 1   prochlorperazine  (COMPAZINE ) 10 MG tablet Take 1 tablet (10 mg total) by mouth every 6 (six) hours as needed for nausea or vomiting. 30 tablet 0   traMADol  (ULTRAM ) 50 MG tablet Take 1 tablet (50 mg total) by mouth every 6 (six) hours as needed. 10 tablet 0   No current facility-administered medications for this visit.    VITAL SIGNS: BP 137/70  Pulse 90   Temp 98.5 F (36.9 C) (Tympanic)   Resp (!) 22   Ht 5' 5 (1.651 m)   Wt 172 lb 8 oz (78.2 kg)   SpO2 100%   BMI 28.71 kg/m  Filed Weights   01/15/24 1055  Weight: 172 lb 8 oz (78.2 kg)    Estimated body mass index is 28.71 kg/m as calculated from the following:   Height as of this encounter: 5' 5 (1.651 m).   Weight as of this encounter: 172 lb 8 oz (78.2 kg).  LABS: CBC:    Component Value Date/Time   WBC 3.9 (L) 01/10/2024 0827   WBC 13.5 (H) 09/15/2015 0434   HGB 9.2 (L) 01/10/2024 0827   HGB 13.1 09/19/2016 1026   HCT 28.2 (L) 01/10/2024 0827   HCT 39.2 09/19/2016 1026   PLT 362 01/10/2024 0827   PLT 336 09/19/2016 1026   MCV 97.6 01/10/2024 0827   MCV 90 09/19/2016 1026   MCV 96 12/20/2013 0939   NEUTROABS 1.7 01/10/2024 0827   NEUTROABS 3.2 09/19/2016 1026   NEUTROABS 3.5 12/20/2013 0939   LYMPHSABS 1.7 01/10/2024 0827   LYMPHSABS 2.5 09/19/2016 1026   LYMPHSABS 2.4 12/20/2013 0939   MONOABS 0.4 01/10/2024 0827   MONOABS 0.4 12/20/2013 0939   EOSABS 0.0  01/10/2024 0827   EOSABS 0.2 09/19/2016 1026   EOSABS 0.2 12/20/2013 0939   BASOSABS 0.1 01/10/2024 0827   BASOSABS 0.1 09/19/2016 1026   BASOSABS 0.1 12/20/2013 0939   Comprehensive Metabolic Panel:    Component Value Date/Time   NA 141 01/10/2024 0827   NA 144 09/19/2016 1026   NA 144 12/20/2013 0939   K 3.3 (L) 01/10/2024 0827   K 3.2 (L) 12/20/2013 0939   CL 109 01/10/2024 0827   CL 105 12/20/2013 0939   CO2 23 01/10/2024 0827   CO2 28 12/20/2013 0939   BUN 10 01/10/2024 0827   BUN 10 09/19/2016 1026   BUN 11 12/20/2013 0939   CREATININE 0.61 01/10/2024 0827   CREATININE 0.76 12/20/2013 0939   GLUCOSE 184 (H) 01/10/2024 0827   GLUCOSE 108 (H) 12/20/2013 0939   CALCIUM 8.8 (L) 01/10/2024 0827   CALCIUM 9.3 12/20/2013 0939   AST 23 01/10/2024 0827   ALT 13 01/10/2024 0827   ALT 18 12/20/2013 0939   ALKPHOS 61 01/10/2024 0827   ALKPHOS 139 (H) 12/20/2013 0939   BILITOT 0.3 01/10/2024 0827   PROT 6.5 01/10/2024 0827   PROT 7.1 09/19/2016 1026   PROT 7.2 12/20/2013 0939   ALBUMIN 3.5 01/10/2024 0827   ALBUMIN 4.4 09/19/2016 1026   ALBUMIN 3.6 12/20/2013 0939    RADIOGRAPHIC STUDIES: No results found.  PERFORMANCE STATUS (ECOG) : 1 - Symptomatic but completely ambulatory  Review of Systems Unless otherwise noted, a complete review of systems is negative.  Physical Exam General: NAD HEENT: No ulcers or thrush, no lymphadenopathy Cardiovascular: regular rate and rhythm Pulmonary: clear ant fields Abdomen: soft, nontender, + bowel sounds GU: no suprapubic tenderness Extremities: no edema, no joint deformities Skin: no rashes Neurological: Weakness but otherwise nonfocal  IMPRESSION/PLAN: Breast cancer -on AC chemotherapy  Fatigue/malaise -likely secondary to effects from chemotherapy.  Recommend supportive care.  Patient declined IV fluids.  Recommend increasing oral intake and rest. Patient would like to go home and rest. Will provide with work note. Return  precautions discussed.   Urinary frequency -will check UA and urine culture  Oral pain -no evidence of active mucositis but  we will send prescription for Magic mouthwash with lidocaine  to have on hand if needed   Patient expressed understanding and was in agreement with this plan. She also understands that She can call clinic at any time with any questions, concerns, or complaints.   Thank you for allowing me to participate in the care of this very pleasant patient.   Time Total: 15 minutes  Visit consisted of counseling and education dealing with the complex and emotionally intense issues of symptom management in the setting of serious illness.Greater than 50%  of this time was spent counseling and coordinating care related to the above assessment and plan.  Signed by: Fonda Mower, PhD, NP-C

## 2024-01-16 LAB — URINE CULTURE: Culture: NO GROWTH

## 2024-01-22 ENCOUNTER — Telehealth: Payer: Self-pay

## 2024-01-22 ENCOUNTER — Encounter: Payer: Self-pay | Admitting: Internal Medicine

## 2024-01-22 NOTE — Telephone Encounter (Signed)
 Called patient to confirm that she did want continuous FMLA as received fax requested.    Patient reports after she receives her treatments on Wednesday she starts feeling bad on Saturday.  Her usual work schedule is Sat, Sun, Mon and is requesting continuous FMLA starting on 01/15/24

## 2024-01-22 NOTE — Telephone Encounter (Signed)
Completed paperwork faxed 

## 2024-01-31 ENCOUNTER — Inpatient Hospital Stay: Attending: Internal Medicine | Admitting: Internal Medicine

## 2024-01-31 ENCOUNTER — Encounter: Payer: Self-pay | Admitting: Internal Medicine

## 2024-01-31 ENCOUNTER — Inpatient Hospital Stay

## 2024-01-31 ENCOUNTER — Inpatient Hospital Stay: Attending: Internal Medicine

## 2024-01-31 ENCOUNTER — Telehealth: Payer: Self-pay

## 2024-01-31 VITALS — BP 130/80 | HR 79

## 2024-01-31 VITALS — BP 104/71 | HR 89 | Temp 97.8°F | Resp 16 | Ht 65.0 in | Wt 168.2 lb

## 2024-01-31 DIAGNOSIS — Z79632 Long term (current) use of antitumor antibiotic: Secondary | ICD-10-CM | POA: Insufficient documentation

## 2024-01-31 DIAGNOSIS — Z79630 Long term (current) use of alkylating agent: Secondary | ICD-10-CM | POA: Diagnosis not present

## 2024-01-31 DIAGNOSIS — C50411 Malignant neoplasm of upper-outer quadrant of right female breast: Secondary | ICD-10-CM

## 2024-01-31 DIAGNOSIS — C773 Secondary and unspecified malignant neoplasm of axilla and upper limb lymph nodes: Secondary | ICD-10-CM | POA: Diagnosis present

## 2024-01-31 DIAGNOSIS — Z5111 Encounter for antineoplastic chemotherapy: Secondary | ICD-10-CM | POA: Diagnosis present

## 2024-01-31 DIAGNOSIS — Z5189 Encounter for other specified aftercare: Secondary | ICD-10-CM | POA: Insufficient documentation

## 2024-01-31 DIAGNOSIS — Z170 Estrogen receptor positive status [ER+]: Secondary | ICD-10-CM | POA: Diagnosis not present

## 2024-01-31 LAB — CBC WITH DIFFERENTIAL (CANCER CENTER ONLY)
Abs Immature Granulocytes: 0.02 K/uL (ref 0.00–0.07)
Basophils Absolute: 0.1 K/uL (ref 0.0–0.1)
Basophils Relative: 2 %
Eosinophils Absolute: 0 K/uL (ref 0.0–0.5)
Eosinophils Relative: 0 %
HCT: 28.7 % — ABNORMAL LOW (ref 36.0–46.0)
Hemoglobin: 9.7 g/dL — ABNORMAL LOW (ref 12.0–15.0)
Immature Granulocytes: 0 %
Lymphocytes Relative: 28 %
Lymphs Abs: 1.5 K/uL (ref 0.7–4.0)
MCH: 32.8 pg (ref 26.0–34.0)
MCHC: 33.8 g/dL (ref 30.0–36.0)
MCV: 97 fL (ref 80.0–100.0)
Monocytes Absolute: 0.8 K/uL (ref 0.1–1.0)
Monocytes Relative: 16 %
Neutro Abs: 2.7 K/uL (ref 1.7–7.7)
Neutrophils Relative %: 54 %
Platelet Count: 463 K/uL — ABNORMAL HIGH (ref 150–400)
RBC: 2.96 MIL/uL — ABNORMAL LOW (ref 3.87–5.11)
RDW: 16.7 % — ABNORMAL HIGH (ref 11.5–15.5)
WBC Count: 5.1 K/uL (ref 4.0–10.5)
nRBC: 0.6 % — ABNORMAL HIGH (ref 0.0–0.2)

## 2024-01-31 LAB — CMP (CANCER CENTER ONLY)
ALT: 42 U/L (ref 0–44)
AST: 39 U/L (ref 15–41)
Albumin: 3.9 g/dL (ref 3.5–5.0)
Alkaline Phosphatase: 76 U/L (ref 38–126)
Anion gap: 8 (ref 5–15)
BUN: 11 mg/dL (ref 8–23)
CO2: 27 mmol/L (ref 22–32)
Calcium: 10.2 mg/dL (ref 8.9–10.3)
Chloride: 108 mmol/L (ref 98–111)
Creatinine: 0.57 mg/dL (ref 0.44–1.00)
GFR, Estimated: >60 mL/min (ref >60)
Glucose, Bld: 111 mg/dL — ABNORMAL HIGH (ref 70–99)
Potassium: 3.8 mmol/L (ref 3.5–5.1)
Sodium: 143 mmol/L (ref 135–145)
Total Bilirubin: <0.2 mg/dL (ref 0.0–1.2)
Total Protein: 6.6 g/dL (ref 6.5–8.1)

## 2024-01-31 MED ORDER — DEXAMETHASONE SOD PHOSPHATE PF 10 MG/ML IJ SOLN
10.0000 mg | Freq: Once | INTRAMUSCULAR | Status: AC
  Administered 2024-01-31: 10 mg via INTRAVENOUS

## 2024-01-31 MED ORDER — SODIUM CHLORIDE 0.9 % IV SOLN
150.0000 mg | Freq: Once | INTRAVENOUS | Status: AC
  Administered 2024-01-31: 150 mg via INTRAVENOUS
  Filled 2024-01-31: qty 150

## 2024-01-31 MED ORDER — SODIUM CHLORIDE 0.9 % IV SOLN
INTRAVENOUS | Status: DC
  Filled 2024-01-31: qty 250

## 2024-01-31 MED ORDER — PALONOSETRON HCL INJECTION 0.25 MG/5ML
0.2500 mg | Freq: Once | INTRAVENOUS | Status: AC
  Administered 2024-01-31: 0.25 mg via INTRAVENOUS
  Filled 2024-01-31: qty 5

## 2024-01-31 MED ORDER — SODIUM CHLORIDE 0.9 % IV SOLN
600.0000 mg/m2 | Freq: Once | INTRAVENOUS | Status: AC
  Administered 2024-01-31: 1160 mg via INTRAVENOUS
  Filled 2024-01-31: qty 58

## 2024-01-31 MED ORDER — DOXORUBICIN HCL CHEMO IV INJECTION 2 MG/ML
60.0000 mg/m2 | Freq: Once | INTRAVENOUS | Status: AC
  Administered 2024-01-31: 116 mg via INTRAVENOUS
  Filled 2024-01-31: qty 58

## 2024-01-31 NOTE — Progress Notes (Signed)
 Nutrition Follow-up:  Patient with right breast cancer.  Receiving AC.    Met with patient during infusion.  Reports poor appetite with new chemo.  Food has a bad taste and no desire to eat.  Everything taste bland.  Has not tried metaqil.  Drank 2 boost shakes a day when not able to eat much.  She has tried sucking on lemons to help with taste.  Reports had some baked beans recently that tasted good.     Medications: reviewed  Labs: glucose 111  Anthropometrics:   Weight 168 lb 3.2 oz 174 lb 15 oz on 10/8 177 lb on 10/15 179 lb on 7/15  6% weight loss in the last 5 months  NUTRITION DIAGNOSIS: Unintentional weight loss related to cancer treatment side effects as evidenced by poor appetite and 6% weight loss in the last 5 months   INTERVENTION:  Reviewed strategies to help with taste change.  Encouraged metaqil.  Handout provided Samples of glucerna given along with coupons.  Encouraged daily if possible.      MONITORING, EVALUATION, GOAL: weight trends, intake   NEXT VISIT: Tuesday, Jan 30 after injection  Emiliano Welshans B. Dasie SOLON, CSO, LDN Registered Dietitian [phone]

## 2024-01-31 NOTE — Progress Notes (Signed)
 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   # JULY-AUG 2025- RIGHT BREAST T2; intramammary lymph node positive- N-1- ER-PR POSITIVE: her 2 neg; Ki-67-50%; right breast cancer with T2N1- ER/PR + her 2 NEG breast cancer.   # AUG 14th, 2025- carbo-Taxol - -HOLD taxol  cycle #12 today-sec to PN/fatigue-and also mild anemia. NOV 12th, 2025- start [facility] q 3 W  # Colonoscopy [Dr.sankar-Neg June 2017]  # Smoker; BRCA-1 positive   Primary cancer of right upper lobe of lung (HCC)  10/26/2015 Initial Diagnosis   Primary cancer of right upper lobe of lung (HCC)   Carcinoma of upper-outer quadrant of right breast in female, estrogen receptor positive (HCC)  09/12/2023 Initial Diagnosis   Carcinoma of upper-outer quadrant of right breast in female, estrogen receptor positive (HCC)   09/12/2023 Cancer Staging   Staging form: Breast, AJCC 8th Edition - Clinical: Stage IIA (cT2, cN1, cM0, G2, ER+, PR+, HER2-) - Signed by Rennie Cindy SAUNDERS, MD on 09/29/2023 Histologic grading system: 3 grade system   10/12/2023 -  Chemotherapy   Patient is on Treatment Plan : BREAST Paclitaxel  q7d / AC q21d       HISTORY OF PRESENTING ILLNESS: Patient ambulating-independently. ALONE>     History of Present Illness    Connie Osborne is a 63 year old female with BRCA1 related breast cancer on neoadjuvant  chemotherapy.   Discussed the use of AI scribe software for clinical note transcription with the patient, who gave verbal consent to proceed.  History of Present Illness   Connie Osborne is a 63 year old female with breast cancer on neoadjuvant chemotherapy who presents for follow-up.  Her recent chemotherapy session was particularly challenging, resulting in fatigue, anorexia, and feelings of lightheadedness and weakness. She developed oral mucositis, which was alleviated with a prescribed mouthwash. She reported joint pain and used Claritin ; she also mentioned Advil as an option.  She experienced diarrhea, which she attributed to protein shakes rather than metformin . She continues to take metformin  along with glipizide  and has not had any recent issues with reflux. There has been some weight loss due to a lack of appetite, and she has taken medication for nausea, which has not been severe.  She reports intermittent burning neuropathy in her legs, which she manages with medication as needed. No recent blood in stools, significant nausea, or vomiting.          Review of Systems  Constitutional:  Positive for malaise/fatigue. Negative for chills, diaphoresis, fever and weight loss.  HENT:  Negative for nosebleeds and sore throat.   Eyes:  Negative for double vision.  Respiratory:  Negative for cough, hemoptysis, sputum production, shortness of breath and wheezing.   Cardiovascular:  Negative for chest pain, palpitations, orthopnea and leg swelling.  Gastrointestinal:  Negative for abdominal pain, blood in stool, constipation, diarrhea, heartburn, melena, nausea and vomiting.  Genitourinary:  Negative for dysuria, frequency and urgency.  Musculoskeletal:  Positive for back  pain and joint pain.  Skin: Negative.  Negative for itching and rash.  Neurological:  Positive for tingling. Negative for dizziness, focal weakness, weakness and headaches.  Endo/Heme/Allergies:  Does not bruise/bleed  easily.  Psychiatric/Behavioral:  Negative for depression. The patient is not nervous/anxious and does not have insomnia.     MEDICAL HISTORY:  Past Medical History:  Diagnosis Date   Allergy    Anxiety    Arthritis    Breast cancer (HCC) 2014   Left- Radiation; BRCA 1 +    COPD (chronic obstructive pulmonary disease) (HCC)    Coughing up blood    GERD (gastroesophageal reflux disease)    Hypertension    Lung cancer (HCC)    Carcinoma, right upper lobe    Personal history of radiation therapy    Pre-diabetes    Shortness of breath dyspnea    Vitamin D deficiency     SURGICAL HISTORY: Past Surgical History:  Procedure Laterality Date   ABDOMINAL HYSTERECTOMY     BREAST BIOPSY Left 2014   +   BREAST BIOPSY Left 06/11/2019   stereo bx, x-clip, negative   BREAST BIOPSY Right 08/30/2023   US  RT BREAST BX W LOC DEV 1ST LESION IMG BX SPEC US  GUIDE 08/30/2023 [facility]   BREAST BIOPSY Right 08/30/2023   US  RT BREAST BX W LOC DEV EA ADD LESION IMG BX SPEC US  GUIDE 08/30/2023 [facility]   BREAST EXCISIONAL BIOPSY Left 2014   BREAST LUMPECTOMY Left 2014   BREAST SURGERY Left 2014   lumpectomy   COLONOSCOPY WITH PROPOFOL  N/A 08/18/2015   Procedure: COLONOSCOPY WITH PROPOFOL ;  Surgeon: Louanne KANDICE Muse, MD;  Location: [facility];  Service: Endoscopy;  Laterality: N/A;   COLONOSCOPY WITH PROPOFOL  N/A 10/12/2016   Procedure: COLONOSCOPY WITH PROPOFOL ;  Surgeon: Muse Louanne KANDICE, MD;  Location: [facility];  Service: Endoscopy;  Laterality: N/A;   ELECTROMAGNETIC NAVIGATION BROCHOSCOPY Right 07/28/2015   Procedure: ELECTROMAGNETIC NAVIGATION BRONCHOSCOPY;  Surgeon: Nickolas Cellar, MD;  Location: [facility];  Service: Cardiopulmonary;  Laterality: Right;   FOOT SURGERY     FRACTURE SURGERY     GANGLION CYST EXCISION     PORTACATH PLACEMENT N/A 10/02/2023   Procedure: INSERTION, TUNNELED CENTRAL VENOUS DEVICE, WITH PORT;  Surgeon: Rodolph Romano, MD;  Location:  [facility];  Service: General;  Laterality: N/A;   THORACOTOMY/LOBECTOMY Right 09/14/2015   Procedure: THORACOTOMY/LOBECTOMY;  Surgeon: Louanne KANDICE Muse, MD;  Location: [facility];  Service: Thoracic;  Laterality: Right;    SOCIAL HISTORY: Social History   Socioeconomic History   Marital status: Single    Spouse name: Not on file   Number of children: Not on file   Years of education: Not on file   Highest education level: Not on file  Occupational History   Not on file  Tobacco Use   Smoking status: Former    Current packs/day: 0.00    Average packs/day: 1 pack/day for 30.0 years (30.0 ttl pk-yrs)    Types: Cigarettes    Start date: 07/29/1985    Quit date: 07/30/2015    Years since quitting: 8.5   Smokeless tobacco: Never  Vaping Use   Vaping status: Never Used  Substance and Sexual Activity   Alcohol use: No    Alcohol/week: 0.0 standard drinks of alcohol   Drug use: No   Sexual activity: Not on file  Other Topics Concern   Not on file  Social History Narrative   Lives alone   Social Drivers of  Health   Financial Resource Strain: Medium Risk (09/07/2023)   Received from [facility]   Overall Financial Resource Strain (CARDIA)    Difficulty of Paying Living Expenses: Somewhat hard  Food Insecurity: No Food Insecurity (09/12/2023)   Hunger Vital Sign    Worried About Running Out of Food in the Last Year: Never true    Ran Out of Food in the Last Year: Never true  Recent Concern: Food Insecurity - Food Insecurity Present (09/07/2023)   Received from [facility]   Hunger Vital Sign    Within the past 12 months, you worried that your food would run out before you got the money to buy more.: Sometimes true    Within the past 12 months, the food you bought just didn't last and you didn't have money to get more.: Never true  Transportation Needs: No Transportation Needs (09/12/2023)   PRAPARE - Administrator, Civil Service  (Medical): No    Lack of Transportation (Non-Medical): No  Physical Activity: Not on file  Stress: Not on file  Social Connections: Not on file  Intimate Partner Violence: Not At Risk (09/12/2023)   Humiliation, Afraid, Rape, and Kick questionnaire    Fear of Current or Ex-Partner: No    Emotionally Abused: No    Physically Abused: No    Sexually Abused: No    FAMILY HISTORY: Family History  Problem Relation Age of Onset   Hypertension Mother    Diabetes Mellitus II Mother    Cancer Mother        mets   Cancer Father        long cancer   Cancer Sister 48       breast   Breast cancer Sister 40   Cancer Sister 55       breast   Breast cancer Sister 26   Lung cancer Brother    Cancer Other        breast    ALLERGIES:  is allergic to accupril [quinapril hcl] and percocet [oxycodone -acetaminophen ].  MEDICATIONS:  Current Outpatient Medications  Medication Sig Dispense Refill   amLODipine  (NORVASC ) 10 MG tablet Take 10 mg by mouth daily with lunch.      Calcium Carb-Cholecalciferol (CALCIUM + VITAMIN D3 PO) Take 1 tablet by mouth in the morning and at bedtime.     cetirizine (ZYRTEC) 10 MG tablet Take 10 mg by mouth as needed for allergies.     diphenoxylate -atropine  (LOMOTIL ) 2.5-0.025 MG tablet Take 1 tablet by mouth 4 (four) times daily as needed for diarrhea or loose stools. Take it along with immodium 60 tablet 0   famotidine  (PEPCID ) 20 MG tablet Take 20 mg by mouth daily as needed for heartburn or indigestion.     gabapentin  (NEURONTIN ) 100 MG capsule Take gabapentin  2 pills at nighttime; and if tolerating well-start taking 1 pill in the morning; and 1 pill in the afternoon. 120 capsule 1   glipiZIDE  (GLUCOTROL  XL) 5 MG 24 hr tablet Take 1 tablet (5 mg total) by mouth daily with breakfast. 30 tablet 6   lidocaine -prilocaine  (EMLA ) cream Apply on the port. 30 -45 min  prior to port access. 30 g 3   losartan  (COZAAR ) 100 MG tablet Take 100 mg by mouth daily with lunch.       magic mouthwash (multi-ingredient) oral suspension Take 5 mLs by mouth 4 (four) times daily. 480 mL 3   magic mouthwash w/lidocaine  SOLN Take 5 mLs by  mouth 4 (four) times daily. 480 mL 3   metFORMIN  (GLUCOPHAGE ) 500 MG tablet Take 500 mg by mouth 2 (two) times daily with a meal.     metoprolol  succinate (TOPROL -XL) 25 MG 24 hr tablet Take 25 mg by mouth daily with lunch.      ondansetron  (ZOFRAN ) 8 MG tablet One pill every 8 hours as needed for nausea/vomitting. 40 tablet 1   Potassium Chloride  ER 20 MEQ TBCR Take 1 tablet (20 mEq total) by mouth 2 (two) times daily. 120 tablet 1   prochlorperazine  (COMPAZINE ) 10 MG tablet Take 1 tablet (10 mg total) by mouth every 6 (six) hours as needed for nausea or vomiting. 30 tablet 0   traMADol  (ULTRAM ) 50 MG tablet Take 1 tablet (50 mg total) by mouth every 6 (six) hours as needed. 10 tablet 0   No current facility-administered medications for this visit.   Facility-Administered Medications Ordered in Other Visits  Medication Dose Route Frequency Provider Last Rate Last Admin   0.9 %  sodium chloride  infusion   Intravenous Continuous Naol Ontiveros R, MD       dexamethasone  (DECADRON ) injection 10 mg  10 mg Intravenous Once Rodrigus Kilker R, MD       fosaprepitant  (EMEND) 150 mg in sodium chloride  0.9 % 145 mL IVPB  150 mg Intravenous Once Adamae Ricklefs R, MD       palonosetron  (ALOXI ) injection 0.25 mg  0.25 mg Intravenous Once Daiveon Markman R, MD        PHYSICAL EXAMINATION:   Vitals:   01/31/24 0812  BP: 104/71  Pulse: 89  Resp: 16  Temp: 97.8 F (36.6 C)  SpO2: 100%   Filed Weights   01/31/24 0812  Weight: 168 lb 3.2 oz (76.3 kg)     Physical Exam Vitals and nursing note reviewed.  HENT:     Head: Normocephalic and atraumatic.     Mouth/Throat:     Pharynx: Oropharynx is clear.  Eyes:     Extraocular Movements: Extraocular movements intact.     Pupils: Pupils are equal, round, and reactive to light.   Cardiovascular:     Rate and Rhythm: Normal rate and regular rhythm.  Pulmonary:     Comments: Decreased breath sounds bilaterally.  Abdominal:     Palpations: Abdomen is soft.  Musculoskeletal:        General: Normal range of motion.     Cervical back: Normal range of motion.  Skin:    General: Skin is warm.  Neurological:     General: No focal deficit present.     Mental Status: She is alert and oriented to person, place, and time.  Psychiatric:        Behavior: Behavior normal.        Judgment: Judgment normal.     LABORATORY DATA:  I have reviewed the data as listed Lab Results  Component Value Date   WBC 5.1 01/31/2024   HGB 9.7 (L) 01/31/2024   HCT 28.7 (L) 01/31/2024   MCV 97.0 01/31/2024   PLT 463 (H) 01/31/2024   Recent Labs    01/10/24 0827 01/15/24 1046 01/31/24 0808  NA 141 137 143  K 3.3* 3.3* 3.8  CL 109 102 108  CO2 23 25 27   GLUCOSE 184* 182* 111*  BUN 10 9 11   CREATININE 0.61 0.44 0.57  CALCIUM 8.8* 9.3 10.2  GFRNONAA >60 >60 >60  PROT 6.5 6.4* 6.6  ALBUMIN 3.5 3.4* 3.9  AST 23 25  39  ALT 13 10 42  ALKPHOS 61 100 76  BILITOT 0.3 0.7 <0.2    RADIOGRAPHIC STUDIES: I have personally reviewed the radiological images as listed and agreed with the findings in the report. No results found.     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 Ki-67 50%- ONE LYMPH NODE, POSITIVE FOR METASTATIC CARCINOMA (1/1); METASTATIC FOCUS: 4 MM; SUSPICIOUS FOR EXTRANODAL EXTENSION Dr.Cintron- ONCOTYPE- 28. Discussed the role of endocrine therapy-given ER/PR positive disease postsurgery; antihormone pill 1 a day for 10 years;  adjuvant Zometa. Also consider CDK- inhibitors/ PARP inhibitors post  surgery. Given multifocality-large size of the tumor; given BRCA 1 positive- PLAN with mastectomy with contralateral prophylactic mastectomy. AUG 18th, 2025- [facility] scan-ejection fraction of 56%.# ON neo-adjuvant chemo- taxol  weekly- with carbo q 3W; and followed by  Adriamycin -Cytoxan  every 3  weeks x 4 cycles. OCT 17th, 2025- Decrease in size of a biopsy-proven RIGHT breast invasive ductal carcinoma from 37 mm to 24 mm.  Also improvement of the right axilla lymphadenopathy.   # CURRENTLY ON neo-adjuvant chemo- proceed with AC chemotherapy every 3 weeks- cycle #2 today- Labs-CBC/chemistries were reviewed with the patient. With Growth factor -Claritin  a day for 7 days; and also can take tylenol /advil as needed.   # Oral mucositis- on maic mouth wash prn.   # blood per rectum- with stool- sec to hemorrhoids monitor for now- stable.   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 Recommend gabapentin .   # hypokalemia-]likely from diarrhea].-continue  K-Dur twice a day --stable.  # diarrhea -G-1-2- f[rom chemo- metformin - STOPPED metformin ]-IMPROVED  Imodium prn/ ADDlomotil prn. stable.     .  # GERD:  on PPI- stable.   # HTN:  stable.    # Hx of Borderline DM-  s/p nutrition- Continue  metformin  1000 mg ER/ [sec to Diarrhea]- added glipizide  5 mg XL /day- stable.  # BRCA-positive- prophylactic contralateral mastectomy.  Consider bilateral prophylactic oophorectomy down the line.  Patient interested in mastectomy.  # Social- FMLA/disability-    PS-  # DISPOSITION: # as per IS- chemo today--; D-2 injection # follow up in 3 weeks- MD; labs- chemo; D-2 injection-  # follow up in 6 weeks- MD; labs- chemo; D-2 injection-Dr.B      Cindy JONELLE Joe, MD 01/31/2024 9:11 AM

## 2024-01-31 NOTE — Progress Notes (Signed)
 Pt states for 2 weeks after last tx she didn't have appetite, getting better. She felt tired and weak, and nausea this last tx.

## 2024-01-31 NOTE — Telephone Encounter (Signed)
 [employer] short term disability claim form received.

## 2024-01-31 NOTE — Telephone Encounter (Signed)
 Form completed, pt aware.  Original downstairs at registration for patient to pick up to complete her portion prior to submission.SABRA

## 2024-01-31 NOTE — Assessment & Plan Note (Addendum)
 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 Ki-67 50%- ONE LYMPH NODE, POSITIVE FOR METASTATIC CARCINOMA (1/1); METASTATIC FOCUS: 4 MM; SUSPICIOUS FOR EXTRANODAL EXTENSION Dr.Cintron- ONCOTYPE- 28. Discussed the role of endocrine therapy-given ER/PR positive disease postsurgery; antihormone pill 1 a day for 10 years;  adjuvant Zometa. Also consider CDK- inhibitors/ PARP inhibitors post surgery. Given multifocality-large size of the tumor; given BRCA 1 positive- PLAN with mastectomy with contralateral prophylactic mastectomy. AUG 18th, 2025- [facility] scan-ejection fraction of 56%.# ON neo-adjuvant chemo- taxol  weekly- with carbo q 3W; and followed by  Adriamycin -Cytoxan  every 3  weeks x 4 cycles. OCT 17th, 2025- Decrease in size of a biopsy-proven RIGHT breast invasive ductal carcinoma from 37 mm to 24 mm.  Also improvement of the right axilla lymphadenopathy.   # CURRENTLY ON neo-adjuvant chemo- proceed with AC chemotherapy every 3 weeks- cycle #2 today- Labs-CBC/chemistries were reviewed with the patient. With Growth factor -Claritin  a day for 7 days; and also can take tylenol /advil as needed.   # Oral mucositis- on maic mouth wash prn.   # blood per rectum- with stool- sec to hemorrhoids monitor for now- stable.   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 Recommend gabapentin .   # hypokalemia-]likely from diarrhea].-continue  K-Dur twice a day --stable.  # diarrhea -G-1-2- f[rom chemo- metformin - STOPPED metformin ]-IMPROVED  Imodium prn/ ADDlomotil prn. stable.     .  # GERD:  on PPI- stable.   # HTN:  stable.    # Hx of Borderline DM-  s/p nutrition- Continue  metformin  1000 mg ER/ [sec to Diarrhea]- added glipizide  5 mg XL /day- stable.  # BRCA-positive- prophylactic contralateral mastectomy.  Consider bilateral  prophylactic oophorectomy down the line.  Patient interested in mastectomy.  # Social- FMLA/disability-    PS-  # DISPOSITION: # as per IS- chemo today--; D-2 injection # follow up in 3 weeks- MD; labs- chemo; D-2 injection-  # follow up in 6 weeks- MD; labs- chemo; D-2 injection-Dr.B

## 2024-02-01 ENCOUNTER — Inpatient Hospital Stay

## 2024-02-01 ENCOUNTER — Other Ambulatory Visit: Payer: Self-pay

## 2024-02-01 DIAGNOSIS — C50411 Malignant neoplasm of upper-outer quadrant of right female breast: Secondary | ICD-10-CM

## 2024-02-01 DIAGNOSIS — Z5111 Encounter for antineoplastic chemotherapy: Secondary | ICD-10-CM | POA: Diagnosis not present

## 2024-02-01 MED ORDER — PEGFILGRASTIM-JMDB 6 MG/0.6ML ~~LOC~~ SOSY
6.0000 mg | PREFILLED_SYRINGE | Freq: Once | SUBCUTANEOUS | Status: AC
  Administered 2024-02-01: 6 mg via SUBCUTANEOUS
  Filled 2024-02-01: qty 0.6

## 2024-02-16 ENCOUNTER — Encounter: Payer: Self-pay | Admitting: *Deleted

## 2024-02-16 NOTE — Progress Notes (Signed)
 Patient came to the cancer center today to request for a return to work note starting on 02/16/24. Letter written.

## 2024-02-26 ENCOUNTER — Inpatient Hospital Stay

## 2024-02-26 ENCOUNTER — Encounter: Payer: Self-pay | Admitting: Internal Medicine

## 2024-02-26 ENCOUNTER — Inpatient Hospital Stay (HOSPITAL_BASED_OUTPATIENT_CLINIC_OR_DEPARTMENT_OTHER): Admitting: Internal Medicine

## 2024-02-26 VITALS — BP 132/78 | HR 85 | Resp 16

## 2024-02-26 VITALS — BP 126/83 | HR 98 | Temp 97.7°F | Resp 16 | Ht 65.0 in | Wt 167.1 lb

## 2024-02-26 DIAGNOSIS — Z5111 Encounter for antineoplastic chemotherapy: Secondary | ICD-10-CM | POA: Diagnosis not present

## 2024-02-26 DIAGNOSIS — Z170 Estrogen receptor positive status [ER+]: Secondary | ICD-10-CM | POA: Diagnosis not present

## 2024-02-26 DIAGNOSIS — C50411 Malignant neoplasm of upper-outer quadrant of right female breast: Secondary | ICD-10-CM

## 2024-02-26 LAB — CMP (CANCER CENTER ONLY)
ALT: 37 U/L (ref 0–44)
AST: 29 U/L (ref 15–41)
Albumin: 4.2 g/dL (ref 3.5–5.0)
Alkaline Phosphatase: 69 U/L (ref 38–126)
Anion gap: 11 (ref 5–15)
BUN: 11 mg/dL (ref 8–23)
CO2: 26 mmol/L (ref 22–32)
Calcium: 10.1 mg/dL (ref 8.9–10.3)
Chloride: 107 mmol/L (ref 98–111)
Creatinine: 0.6 mg/dL (ref 0.44–1.00)
GFR, Estimated: >60 mL/min
Glucose, Bld: 125 mg/dL — ABNORMAL HIGH (ref 70–99)
Potassium: 3.7 mmol/L (ref 3.5–5.1)
Sodium: 143 mmol/L (ref 135–145)
Total Bilirubin: <0.2 mg/dL (ref 0.0–1.2)
Total Protein: 6.6 g/dL (ref 6.5–8.1)

## 2024-02-26 LAB — CBC WITH DIFFERENTIAL (CANCER CENTER ONLY)
Abs Immature Granulocytes: 0.02 K/uL (ref 0.00–0.07)
Basophils Absolute: 0.1 K/uL (ref 0.0–0.1)
Basophils Relative: 2 %
Eosinophils Absolute: 0.1 K/uL (ref 0.0–0.5)
Eosinophils Relative: 1 %
HCT: 31.2 % — ABNORMAL LOW (ref 36.0–46.0)
Hemoglobin: 10.5 g/dL — ABNORMAL LOW (ref 12.0–15.0)
Immature Granulocytes: 0 %
Lymphocytes Relative: 25 %
Lymphs Abs: 1.4 K/uL (ref 0.7–4.0)
MCH: 32.5 pg (ref 26.0–34.0)
MCHC: 33.7 g/dL (ref 30.0–36.0)
MCV: 96.6 fL (ref 80.0–100.0)
Monocytes Absolute: 0.7 K/uL (ref 0.1–1.0)
Monocytes Relative: 13 %
Neutro Abs: 3.2 K/uL (ref 1.7–7.7)
Neutrophils Relative %: 59 %
Platelet Count: 389 K/uL (ref 150–400)
RBC: 3.23 MIL/uL — ABNORMAL LOW (ref 3.87–5.11)
RDW: 14.8 % (ref 11.5–15.5)
WBC Count: 5.5 K/uL (ref 4.0–10.5)
nRBC: 0 % (ref 0.0–0.2)

## 2024-02-26 MED ORDER — DEXAMETHASONE SOD PHOSPHATE PF 10 MG/ML IJ SOLN
10.0000 mg | Freq: Once | INTRAMUSCULAR | Status: AC
  Administered 2024-02-26: 10 mg via INTRAVENOUS

## 2024-02-26 MED ORDER — SODIUM CHLORIDE 0.9 % IV SOLN
600.0000 mg/m2 | Freq: Once | INTRAVENOUS | Status: AC
  Administered 2024-02-26: 1160 mg via INTRAVENOUS
  Filled 2024-02-26: qty 58

## 2024-02-26 MED ORDER — DOXORUBICIN HCL CHEMO IV INJECTION 2 MG/ML
60.0000 mg/m2 | Freq: Once | INTRAVENOUS | Status: AC
  Administered 2024-02-26: 116 mg via INTRAVENOUS
  Filled 2024-02-26: qty 58

## 2024-02-26 MED ORDER — SODIUM CHLORIDE 0.9 % IV SOLN
INTRAVENOUS | Status: DC
  Filled 2024-02-26: qty 250

## 2024-02-26 MED ORDER — SODIUM CHLORIDE 0.9 % IV SOLN
150.0000 mg | Freq: Once | INTRAVENOUS | Status: AC
  Administered 2024-02-26: 150 mg via INTRAVENOUS
  Filled 2024-02-26: qty 150

## 2024-02-26 MED ORDER — PALONOSETRON HCL INJECTION 0.25 MG/5ML
0.2500 mg | Freq: Once | INTRAVENOUS | Status: AC
  Administered 2024-02-26: 0.25 mg via INTRAVENOUS
  Filled 2024-02-26: qty 5

## 2024-02-26 NOTE — Progress Notes (Signed)
 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   # JULY-AUG 2025- RIGHT BREAST T2; intramammary lymph node positive- N-1- ER-PR POSITIVE: her 2 neg; Ki-67-50%; right breast cancer with T2N1- ER/PR + her 2 NEG breast cancer.   # AUG 14th, 2025- carbo-Taxol - -HOLD taxol  cycle #12 today-sec to PN/fatigue-and also mild anemia. NOV 12th, 2025- start [facility] q 3 W  # Colonoscopy [Dr.sankar-Neg June 2017]  # Smoker; BRCA-1 positive   Primary cancer of right upper lobe of lung (HCC)  10/26/2015 Initial Diagnosis   Primary cancer of right upper lobe of lung (HCC)   Carcinoma of upper-outer quadrant of right breast in female, estrogen receptor positive (HCC)  09/12/2023 Initial Diagnosis   Carcinoma of upper-outer quadrant of right breast in female, estrogen receptor positive (HCC)   09/12/2023 Cancer Staging   Staging form: Breast, AJCC 8th Edition - Clinical: Stage IIA (cT2, cN1, cM0, G2, ER+, PR+, HER2-) - Signed by Rennie Cindy SAUNDERS, MD on 09/29/2023 Histologic grading system: 3 grade system   10/12/2023 -  Chemotherapy   Patient is on Treatment Plan : BREAST Paclitaxel  q7d / AC q21d       HISTORY OF PRESENTING ILLNESS: Patient ambulating-independently. ALONE>     History of Present Illness    Connie Osborne is a 63 year old female with BRCA1 related breast cancer on neoadjuvant  chemotherapy.   Discussed the use of AI scribe software for clinical note transcription with the patient, who gave verbal consent to proceed.  History of Present Illness   Connie Osborne is a 63 year old female with estrogen receptor positive right breast carcinoma receiving neoadjuvant chemotherapy who presents for follow-up of ongoing treatment and chemotherapy-related side effects.  She is currently undergoing neoadjuvant chemotherapy for right breast cancer. The patient reports that the most recent chemotherapy session went better than the one before. She continues to experience mild, predominantly morning nausea, which resolves with antiemetic therapy. She denies oral mucositis, diarrhea, abdominal pain, cramping, or hematochezia. Appetite remains preserved, and she has been able to participate in family meals.  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 She was initiated on low-dose gabapentin  but has taken it intermittently, believing it was solely for pain management. She expresses concern regarding constipation with pain medications but has not experienced constipation with gabapentin .        Review of Systems  Constitutional:  Positive for malaise/fatigue. Negative for chills, diaphoresis, fever and weight loss.  HENT:  Negative for nosebleeds and sore throat.   Eyes:  Negative for double vision.  Respiratory:  Negative for cough, hemoptysis, sputum production, shortness of breath and wheezing.   Cardiovascular:  Negative for chest pain, palpitations, orthopnea and leg swelling.  Gastrointestinal:  Negative for abdominal pain, blood in stool, constipation, diarrhea, heartburn, melena, nausea and vomiting.  Genitourinary:  Negative for dysuria, frequency and urgency.  Musculoskeletal:  Positive for back pain and joint pain.  Skin: Negative.  Negative for itching and rash.  Neurological:  Positive for tingling. Negative for  dizziness, focal weakness, weakness and headaches.  Endo/Heme/Allergies:  Does not bruise/bleed easily.  Psychiatric/Behavioral:  Negative for depression. The patient is not nervous/anxious and does not have insomnia.     MEDICAL HISTORY:  Past Medical History:  Diagnosis Date   Allergy    Anxiety    Arthritis    Breast cancer (HCC) 2014   Left- Radiation; BRCA 1 +    COPD (chronic obstructive pulmonary disease) (HCC)    Coughing up blood    GERD (gastroesophageal reflux disease)    Hypertension    Lung cancer (HCC)    Carcinoma, right upper lobe    Personal history of radiation therapy    Pre-diabetes    Shortness of breath dyspnea    Vitamin D deficiency     SURGICAL HISTORY: Past Surgical History:  Procedure Laterality Date   ABDOMINAL HYSTERECTOMY     BREAST BIOPSY Left 2014   +   BREAST BIOPSY Left 06/11/2019   stereo bx, x-clip, negative   BREAST BIOPSY Right 08/30/2023   US  RT BREAST BX W LOC DEV 1ST LESION IMG BX SPEC US  GUIDE 08/30/2023 [facility]   BREAST BIOPSY Right 08/30/2023   US  RT BREAST BX W LOC DEV EA ADD LESION IMG BX SPEC US  GUIDE 04/06/7972 [facility]   BREAST EXCISIONAL BIOPSY Left 2014   BREAST LUMPECTOMY Left 2014   BREAST SURGERY Left 2014   lumpectomy   COLONOSCOPY WITH PROPOFOL  N/A 08/18/2015   Procedure: COLONOSCOPY WITH PROPOFOL ;  Surgeon: Louanne KANDICE Muse, MD;  Location: [facility];  Service: Endoscopy;  Laterality: N/A;   COLONOSCOPY WITH PROPOFOL  N/A 10/12/2016   Procedure: COLONOSCOPY WITH PROPOFOL ;  Surgeon: Muse Louanne KANDICE, MD;  Location: [facility];  Service: Endoscopy;  Laterality: N/A;   ELECTROMAGNETIC NAVIGATION BROCHOSCOPY Right 07/28/2015   Procedure: ELECTROMAGNETIC NAVIGATION BRONCHOSCOPY;  Surgeon: Nickolas Cellar, MD;  Location: [facility];  Service: Cardiopulmonary;  Laterality: Right;   FOOT SURGERY     FRACTURE SURGERY     GANGLION CYST EXCISION     PORTACATH PLACEMENT N/A 10/02/2023   Procedure:  INSERTION, TUNNELED CENTRAL VENOUS DEVICE, WITH PORT;  Surgeon: Rodolph Romano, MD;  Location: [facility];  Service: General;  Laterality: N/A;   THORACOTOMY/LOBECTOMY Right 09/14/2015   Procedure: THORACOTOMY/LOBECTOMY;  Surgeon: Louanne KANDICE Muse, MD;  Location: [facility];  Service: Thoracic;  Laterality: Right;    SOCIAL HISTORY: Social History   Socioeconomic History   Marital status: Single    Spouse name: Not on file   Number of children: Not on file   Years of education: Not on file   Highest education level: Not on file  Occupational History   Not on file  Tobacco Use   Smoking status: Former    Current packs/day: 0.00    Average packs/day: 1 pack/day for 30.0 years (30.0 ttl pk-yrs)    Types: Cigarettes    Start date: 07/29/1985    Quit date: 07/30/2015    Years since quitting: 8.5   Smokeless tobacco: Never  Vaping Use   Vaping status: Never Used  Substance and Sexual Activity   Alcohol use: No    Alcohol/week: 0.0 standard drinks of alcohol   Drug use: No   Sexual activity: Not on file  Other Topics Concern   Not on file  Social History Narrative   Lives alone   Social Drivers of Health   Tobacco Use:  Medium Risk (02/26/2024)   Patient History    Smoking Tobacco Use: Former    Smokeless Tobacco Use: Never    Passive Exposure: Not on file  Financial Resource Strain: Medium Risk (09/07/2023)   [facility]   Overall Financial Resource Strain (CARDIA)    Difficulty of Paying Living Expenses: Somewhat hard  Food Insecurity: No Food Insecurity (09/12/2023)   Epic    Worried About Programme Researcher, Broadcasting/film/video in the Last Year: Never true    Ran Out of Food in the Last Year: Never true  Recent Concern: Food Insecurity - Food Insecurity Present (09/07/2023)   Received from [facility]   Epic    Within the past 12 months, you worried that your food would run out before you got the money to buy more.: Sometimes true     Within the past 12 months, the food you bought just didn't last and you didn't have money to get more.: Never true  Transportation Needs: No Transportation Needs (09/12/2023)   Epic    Lack of Transportation (Medical): No    Lack of Transportation (Non-Medical): No  Physical Activity: Not on file  Stress: Not on file  Social Connections: Not on file  Intimate Partner Violence: Not At Risk (09/12/2023)   Epic    Fear of Current or Ex-Partner: No    Emotionally Abused: No    Physically Abused: No    Sexually Abused: No  Depression (PHQ2-9): Low Risk (02/26/2024)   Depression (PHQ2-9)    PHQ-2 Score: 3  Alcohol Screen: Not on file  Housing: Low Risk (09/12/2023)   Epic    Unable to Pay for Housing in the Last Year: No    Number of Times Moved in the Last Year: 0    Homeless in the Last Year: No  Recent Concern: Housing - High Risk (09/07/2023)   Received from [facility]   Epic    In the last 12 months, was there a time when you were not able to pay the mortgage or rent on time?: Yes    In the past 12 months, how many times have you moved where you were living?: 0    At any time in the past 12 months, were you homeless or living in a shelter (including now)?: No  Utilities: Not At Risk (09/12/2023)   Epic    Threatened with loss of utilities: No  Health Literacy: Adequate Health Literacy (10/04/2023)   B1300 Health Literacy    Frequency of need for help with medical instructions: Never    FAMILY HISTORY: Family History  Problem Relation Age of Onset   Hypertension Mother    Diabetes Mellitus II Mother    Cancer Mother        mets   Cancer Father        long cancer   Cancer Sister 16       breast   Breast cancer Sister 69   Cancer Sister 41       breast   Breast cancer Sister 19   Lung cancer Brother    Cancer Other        breast    ALLERGIES:  is allergic to accupril [quinapril hcl] and percocet [oxycodone -acetaminophen ].  MEDICATIONS:  Current  Outpatient Medications  Medication Sig Dispense Refill   amLODipine  (NORVASC ) 10 MG tablet Take 10 mg by mouth daily with lunch.      Calcium Carb-Cholecalciferol (CALCIUM + VITAMIN  D3 PO) Take 1 tablet by mouth in the morning and at bedtime.     cetirizine (ZYRTEC) 10 MG tablet Take 10 mg by mouth as needed for allergies.     diphenoxylate -atropine  (LOMOTIL ) 2.5-0.025 MG tablet Take 1 tablet by mouth 4 (four) times daily as needed for diarrhea or loose stools. Take it along with immodium 60 tablet 0   famotidine  (PEPCID ) 20 MG tablet Take 20 mg by mouth daily as needed for heartburn or indigestion.     gabapentin  (NEURONTIN ) 100 MG capsule Take gabapentin  2 pills at nighttime; and if tolerating well-start taking 1 pill in the morning; and 1 pill in the afternoon. 120 capsule 1   glipiZIDE  (GLUCOTROL  XL) 5 MG 24 hr tablet Take 1 tablet (5 mg total) by mouth daily with breakfast. 30 tablet 6   lidocaine -prilocaine  (EMLA ) cream Apply on the port. 30 -45 min  prior to port access. 30 g 3   losartan  (COZAAR ) 100 MG tablet Take 100 mg by mouth daily with lunch.      magic mouthwash (multi-ingredient) oral suspension Take 5 mLs by mouth 4 (four) times daily. 480 mL 3   magic mouthwash w/lidocaine  SOLN Take 5 mLs by mouth 4 (four) times daily. 480 mL 3   metFORMIN  (GLUCOPHAGE ) 500 MG tablet Take 500 mg by mouth 2 (two) times daily with a meal.     metoprolol  succinate (TOPROL -XL) 25 MG 24 hr tablet Take 25 mg by mouth daily with lunch.      ondansetron  (ZOFRAN ) 8 MG tablet One pill every 8 hours as needed for nausea/vomitting. 40 tablet 1   Potassium Chloride  ER 20 MEQ TBCR Take 1 tablet (20 mEq total) by mouth 2 (two) times daily. 120 tablet 1   prochlorperazine  (COMPAZINE ) 10 MG tablet Take 1 tablet (10 mg total) by mouth every 6 (six) hours as needed for nausea or vomiting. 30 tablet 0   traMADol  (ULTRAM ) 50 MG tablet Take 1 tablet (50 mg total) by mouth every 6 (six) hours as needed. 10 tablet 0    No current facility-administered medications for this visit.   Facility-Administered Medications Ordered in Other Visits  Medication Dose Route Frequency Provider Last Rate Last Admin   0.9 %  sodium chloride  infusion   Intravenous Continuous Rennie, Landyn Lorincz R, MD       cyclophosphamide  (CYTOXAN ) 1,160 mg in sodium chloride  0.9 % 250 mL chemo infusion  600 mg/m2 (Treatment Plan Recorded) Intravenous Once Abhi Moccia R, MD       dexamethasone  (DECADRON ) injection 10 mg  10 mg Intravenous Once Kelbie Moro R, MD       DOXOrubicin  (ADRIAMYCIN ) chemo injection 116 mg  60 mg/m2 (Treatment Plan Recorded) Intravenous Once Nedim Oki R, MD       fosaprepitant  (EMEND) 150 mg in sodium chloride  0.9 % 145 mL IVPB  150 mg Intravenous Once Desma Wilkowski R, MD       palonosetron  (ALOXI ) injection 0.25 mg  0.25 mg Intravenous Once Dao Mearns R, MD        PHYSICAL EXAMINATION:   Vitals:   02/26/24 0935  BP: 126/83  Pulse: 98  Resp: 16  Temp: 97.7 F (36.5 C)  SpO2: 99%   Filed Weights   02/26/24 0935  Weight: 167 lb 1.6 oz (75.8 kg)     Physical Exam Vitals and nursing note reviewed.  HENT:     Head: Normocephalic and atraumatic.     Mouth/Throat:     Pharynx: Oropharynx  is clear.  Eyes:     Extraocular Movements: Extraocular movements intact.     Pupils: Pupils are equal, round, and reactive to light.  Cardiovascular:     Rate and Rhythm: Normal rate and regular rhythm.  Pulmonary:     Comments: Decreased breath sounds bilaterally.  Abdominal:     Palpations: Abdomen is soft.  Musculoskeletal:        General: Normal range of motion.     Cervical back: Normal range of motion.  Skin:    General: Skin is warm.  Neurological:     General: No focal deficit present.     Mental Status: She is alert and oriented to person, place, and time.  Psychiatric:        Behavior: Behavior normal.        Judgment: Judgment normal.     LABORATORY  DATA:  I have reviewed the data as listed Lab Results  Component Value Date   WBC 5.5 02/26/2024   HGB 10.5 (L) 02/26/2024   HCT 31.2 (L) 02/26/2024   MCV 96.6 02/26/2024   PLT 389 02/26/2024   Recent Labs    01/15/24 1046 01/31/24 0808 02/26/24 0947  NA 137 143 143  K 3.3* 3.8 3.7  CL 102 108 107  CO2 25 27 26   GLUCOSE 182* 111* 125*  BUN 9 11 11   CREATININE 0.44 0.57 0.60  CALCIUM 9.3 10.2 10.1  GFRNONAA >60 >60 >60  PROT 6.4* 6.6 6.6  ALBUMIN 3.4* 3.9 4.2  AST 25 39 29  ALT 10 42 37  ALKPHOS 100 76 69  BILITOT 0.7 <0.2 <0.2    RADIOGRAPHIC STUDIES: I have personally reviewed the radiological images as listed and agreed with the findings in the report. No results found.     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 Ki-67 50%- ONE LYMPH NODE, POSITIVE FOR METASTATIC CARCINOMA (1/1); METASTATIC FOCUS: 4 MM; SUSPICIOUS FOR EXTRANODAL EXTENSION Dr.Cintron- ONCOTYPE- 28. Discussed the role of endocrine therapy-given ER/PR positive disease postsurgery; antihormone pill 1 a day for 10 years;  adjuvant Zometa. Also consider CDK- inhibitors/ PARP inhibitors post surgery. Given multifocality-large size of the tumor; given BRCA 1 positive- PLAN with mastectomy with contralateral prophylactic mastectomy. AUG 18th, 2025- [facility] scan-ejection fraction of 56%.# ON neo-adjuvant chemo- taxol  weekly- with carbo q 3W; and followed by  Adriamycin -Cytoxan  every 3  weeks x 4 cycles. OCT 17th, 2025- Decrease in size of a biopsy-proven RIGHT breast invasive ductal carcinoma from 37 mm to 24 mm.  Also improvement of the right axilla lymphadenopathy.   # CURRENTLY ON neo-adjuvant chemo- proceed with AC chemotherapy every 3 weeks-  cycle #3 of planned #4 cycles today- Labs-CBC/chemistries were reviewed with the patient. With Growth factor -Claritin  a day for 7 days; and also can take tylenol /advil as needed.   # Nausea- no vomiting- G-1-2- continue anti-emetics. Stable.  # Oral mucositis- on magic mouth wash prn.  Stable.  # blood per rectum- with stool- sec to hemorrhoids monitor for now- stable.   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 Recommend  compliance gabapentin - stable.   # hypokalemia-]likely from diarrhea].-continue  K-Dur twice a day --stable.  # diarrhea -G-1-2- f[rom chemo- metformin - STOPPED metformin ]-IMPROVED  Imodium prn/ ADDlomotil  prn. stable.     .  # GERD:  on PPI- stable.   # HTN:  stable.    # Hx of Borderline DM-  s/p nutrition- Continue  metformin  1000 mg ER/ [sec to Diarrhea]- added glipizide  5 mg XL /day- stable.  # BRCA-positive- prophylactic contralateral mastectomy.  Consider bilateral prophylactic oophorectomy down the line.  Patient interested in mastectomy.  # Social- FMLA/disability-    PS-  # DISPOSITION: # as per IS- chemo today--; D-2 injection # follow up in 3 weeks- MD; labs- chemo; D-2 injection- -Dr.B      Cindy JONELLE Joe, MD 02/26/2024 10:33 AM

## 2024-02-26 NOTE — Patient Instructions (Signed)
 [facility] - A DEPT OF [facility]  Discharge Instructions: Thank you for choosing [facility] to provide your oncology and hematology care.  If you have a lab appointment with the Cancer Center, please go directly to the Cancer Center and check in at the registration area.  Wear comfortable clothing and clothing appropriate for easy access to any Portacath or PICC line.   We strive to give you quality time with your provider. You may need to reschedule your appointment if you arrive late (15 or more minutes).  Arriving late affects you and other patients whose appointments are after yours.  Also, if you miss three or more appointments without notifying the office, you may be dismissed from the clinic at the provider's discretion.      For prescription refill requests, have your pharmacy contact our office and allow 72 hours for refills to be completed.    Today you received the following chemotherapy and/or immunotherapy agents Doxorubicin , Cyclophosphamide        To help prevent nausea and vomiting after your treatment, we encourage you to take your nausea medication as directed.  BELOW ARE SYMPTOMS THAT SHOULD BE REPORTED IMMEDIATELY: *FEVER GREATER THAN 100.4 F (38 C) OR HIGHER *CHILLS OR SWEATING *NAUSEA AND VOMITING THAT IS NOT CONTROLLED WITH YOUR NAUSEA MEDICATION *UNUSUAL SHORTNESS OF BREATH *UNUSUAL BRUISING OR BLEEDING *URINARY PROBLEMS (pain or burning when urinating, or frequent urination) *BOWEL PROBLEMS (unusual diarrhea, constipation, pain near the anus) TENDERNESS IN MOUTH AND THROAT WITH OR WITHOUT PRESENCE OF ULCERS (sore throat, sores in mouth, or a toothache) UNUSUAL RASH, SWELLING OR PAIN  UNUSUAL VAGINAL DISCHARGE OR ITCHING   Items with * indicate a potential emergency and should be followed up as soon as possible or go to the Emergency Department if any problems should occur.  Please show the CHEMOTHERAPY ALERT  CARD or IMMUNOTHERAPY ALERT CARD at check-in to the Emergency Department and triage nurse.  Should you have questions after your visit or need to cancel or reschedule your appointment, please contact [facility] - A DEPT OF [facility]  [phone] and follow the prompts.  Office hours are 8:00 a.m. to 4:30 p.m. Monday - Friday. Please note that voicemails left after 4:00 p.m. may not be returned until the following business day.  We are closed weekends and major holidays. You have access to a nurse at all times for urgent questions. Please call the main number to the clinic [phone] and follow the prompts.  For any non-urgent questions, you may also contact your provider using MyChart. We now offer e-Visits for anyone 34 and older to request care online for non-urgent symptoms. For details visit [email or web address].   Also download the MyChart app! Go to the app store, search MyChart, open the app, select [place], and log in with your MyChart username and password.

## 2024-02-26 NOTE — Progress Notes (Signed)
"   No concerns today  "

## 2024-02-26 NOTE — Assessment & Plan Note (Addendum)
 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 Ki-67 50%- ONE LYMPH NODE, POSITIVE FOR METASTATIC CARCINOMA (1/1); METASTATIC FOCUS: 4 MM; SUSPICIOUS FOR EXTRANODAL EXTENSION Dr.Cintron- ONCOTYPE- 28. Discussed the role of endocrine therapy-given ER/PR positive disease postsurgery; antihormone pill 1 a day for 10 years;  adjuvant Zometa. Also consider CDK- inhibitors/ PARP inhibitors post surgery. Given multifocality-large size of the tumor; given BRCA 1 positive- PLAN with mastectomy with contralateral prophylactic mastectomy. AUG 18th, 2025- [facility] scan-ejection fraction of 56%.# ON neo-adjuvant chemo- taxol  weekly- with carbo q 3W; and followed by  Adriamycin -Cytoxan  every 3  weeks x 4 cycles. OCT 17th, 2025- Decrease in size of a biopsy-proven RIGHT breast invasive ductal carcinoma from 37 mm to 24 mm.  Also improvement of the right axilla lymphadenopathy.   # CURRENTLY ON neo-adjuvant chemo- proceed with AC chemotherapy every 3 weeks- cycle #3 of planned #4 cycles today- Labs-CBC/chemistries were reviewed with the patient. With Growth factor -Claritin  a day for 7 days; and also can take tylenol /advil as needed.   # Nausea- no vomiting- G-1-2- continue anti-emetics. Stable.  # Oral mucositis- on magic mouth wash prn.  Stable.  # blood per rectum- with stool- sec to hemorrhoids monitor for now- stable.   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 Recommend  compliance gabapentin - stable.   # hypokalemia-]likely from diarrhea].-continue  K-Dur twice a day --stable.  # diarrhea -G-1-2- f[rom chemo- metformin - STOPPED metformin ]-IMPROVED  Imodium prn/ ADDlomotil prn. stable.     .  # GERD:  on PPI- stable.   # HTN:  stable.    # Hx of Borderline DM-  s/p nutrition- Continue  metformin  1000 mg ER/ [sec to Diarrhea]-  added glipizide  5 mg XL /day- stable.  # BRCA-positive- prophylactic contralateral mastectomy.  Consider bilateral prophylactic oophorectomy down the line.  Patient interested in mastectomy.  # Social- FMLA/disability-    PS-  # DISPOSITION: # as per IS- chemo today--; D-2 injection # follow up in 3 weeks- MD; labs- chemo; D-2 injection- -Dr.B

## 2024-02-27 ENCOUNTER — Inpatient Hospital Stay

## 2024-02-27 DIAGNOSIS — Z170 Estrogen receptor positive status [ER+]: Secondary | ICD-10-CM

## 2024-02-27 DIAGNOSIS — Z5111 Encounter for antineoplastic chemotherapy: Secondary | ICD-10-CM | POA: Diagnosis not present

## 2024-02-27 MED ORDER — PEGFILGRASTIM-JMDB 6 MG/0.6ML ~~LOC~~ SOSY
6.0000 mg | PREFILLED_SYRINGE | Freq: Once | SUBCUTANEOUS | Status: AC
  Administered 2024-02-27: 6 mg via SUBCUTANEOUS
  Filled 2024-02-27: qty 0.6

## 2024-02-27 NOTE — Progress Notes (Signed)
 Nutrition Follow-up:  Patient with right breast cancer.  Receiving adriamycin  and cytoxan . Followed by Dr Rennie.    Met with patient following injection.  Reports that she had to take nausea pill last night.  Did not feel like anything all day today but feels like she can eat something now.  I feel hungry. Reports that she was able to eat at Christmas and taste foods that she enjoys.  Has some shakes at home that she is drinking for added nutrition.    Reports that she was taken off glipizide  by PCP and only taking metformin  for blood glucose    Medications: reviewed  Labs: reviewed  Anthropometrics:   Weight 167 lb 1.6 oz on 12/29 174 lb 15 oz on 10/8 177 lb on 10/15 179 lb on 7/15  NUTRITION DIAGNOSIS: Unintentional weight loss continues   INTERVENTION:  Encouraged oral nutrition supplement for added calories and protein, daily Encouraged high calorie, high protein foods to help slow down weight loss    MONITORING, EVALUATION, GOAL: weight trends, intake   NEXT VISIT: Tuesday, Jan 20 after injection  Shaleah Nissley B. Dasie SOLON, CSO, LDN Registered Dietitian [phone]

## 2024-02-28 ENCOUNTER — Other Ambulatory Visit: Payer: Self-pay

## 2024-03-04 DIAGNOSIS — C3411 Malignant neoplasm of upper lobe, right bronchus or lung: Secondary | ICD-10-CM

## 2024-03-04 NOTE — Telephone Encounter (Signed)
 Effectiveness of Out-of-Pocket Psychologist, Forensic ([employer]) in Cancer Patients   Research nurse called patient to remind her of her [employer] phone call on 03/11/2024 at 1445 pm. Patient states she has it written down and will be able to complete the call for them. Also, reminded patient of her appointment on 03/18/24 with Dr. Rennie. Research nurse will see  patient at that appointment for the protocol visit. Reena Romans, RN 03/04/2024 2:01 PM

## 2024-03-18 ENCOUNTER — Inpatient Hospital Stay

## 2024-03-18 ENCOUNTER — Encounter: Payer: Self-pay | Admitting: Internal Medicine

## 2024-03-18 ENCOUNTER — Inpatient Hospital Stay (HOSPITAL_BASED_OUTPATIENT_CLINIC_OR_DEPARTMENT_OTHER): Admitting: Internal Medicine

## 2024-03-18 ENCOUNTER — Inpatient Hospital Stay: Attending: Internal Medicine

## 2024-03-18 DIAGNOSIS — C50411 Malignant neoplasm of upper-outer quadrant of right female breast: Secondary | ICD-10-CM | POA: Diagnosis not present

## 2024-03-18 DIAGNOSIS — C773 Secondary and unspecified malignant neoplasm of axilla and upper limb lymph nodes: Secondary | ICD-10-CM | POA: Insufficient documentation

## 2024-03-18 DIAGNOSIS — Z79632 Long term (current) use of antitumor antibiotic: Secondary | ICD-10-CM | POA: Diagnosis not present

## 2024-03-18 DIAGNOSIS — Z5111 Encounter for antineoplastic chemotherapy: Secondary | ICD-10-CM | POA: Insufficient documentation

## 2024-03-18 DIAGNOSIS — Z5189 Encounter for other specified aftercare: Secondary | ICD-10-CM | POA: Insufficient documentation

## 2024-03-18 DIAGNOSIS — Z79630 Long term (current) use of alkylating agent: Secondary | ICD-10-CM | POA: Insufficient documentation

## 2024-03-18 DIAGNOSIS — Z170 Estrogen receptor positive status [ER+]: Secondary | ICD-10-CM | POA: Diagnosis not present

## 2024-03-18 LAB — CBC WITH DIFFERENTIAL (CANCER CENTER ONLY)
Abs Immature Granulocytes: 0.03 K/uL (ref 0.00–0.07)
Basophils Absolute: 0.1 K/uL (ref 0.0–0.1)
Basophils Relative: 1 %
Eosinophils Absolute: 0.1 K/uL (ref 0.0–0.5)
Eosinophils Relative: 1 %
HCT: 29.4 % — ABNORMAL LOW (ref 36.0–46.0)
Hemoglobin: 10 g/dL — ABNORMAL LOW (ref 12.0–15.0)
Immature Granulocytes: 1 %
Lymphocytes Relative: 21 %
Lymphs Abs: 1.1 K/uL (ref 0.7–4.0)
MCH: 31.9 pg (ref 26.0–34.0)
MCHC: 34 g/dL (ref 30.0–36.0)
MCV: 93.9 fL (ref 80.0–100.0)
Monocytes Absolute: 0.9 K/uL (ref 0.1–1.0)
Monocytes Relative: 16 %
Neutro Abs: 3.3 K/uL (ref 1.7–7.7)
Neutrophils Relative %: 60 %
Platelet Count: 294 K/uL (ref 150–400)
RBC: 3.13 MIL/uL — ABNORMAL LOW (ref 3.87–5.11)
RDW: 14.2 % (ref 11.5–15.5)
WBC Count: 5.4 K/uL (ref 4.0–10.5)
nRBC: 0 % (ref 0.0–0.2)

## 2024-03-18 LAB — CMP (CANCER CENTER ONLY)
ALT: 17 U/L (ref 0–44)
AST: 22 U/L (ref 15–41)
Albumin: 4 g/dL (ref 3.5–5.0)
Alkaline Phosphatase: 75 U/L (ref 38–126)
Anion gap: 8 (ref 5–15)
BUN: 12 mg/dL (ref 8–23)
CO2: 28 mmol/L (ref 22–32)
Calcium: 10.5 mg/dL — ABNORMAL HIGH (ref 8.9–10.3)
Chloride: 107 mmol/L (ref 98–111)
Creatinine: 0.56 mg/dL (ref 0.44–1.00)
GFR, Estimated: >60 mL/min
Glucose, Bld: 109 mg/dL — ABNORMAL HIGH (ref 70–99)
Potassium: 3.5 mmol/L (ref 3.5–5.1)
Sodium: 143 mmol/L (ref 135–145)
Total Bilirubin: <0.2 mg/dL (ref 0.0–1.2)
Total Protein: 6.7 g/dL (ref 6.5–8.1)

## 2024-03-18 MED ORDER — SODIUM CHLORIDE 0.9 % IV SOLN
600.0000 mg/m2 | Freq: Once | INTRAVENOUS | Status: AC
  Administered 2024-03-18: 1160 mg via INTRAVENOUS
  Filled 2024-03-18: qty 58

## 2024-03-18 MED ORDER — PALONOSETRON HCL INJECTION 0.25 MG/5ML
0.2500 mg | Freq: Once | INTRAVENOUS | Status: AC
  Administered 2024-03-18: 0.25 mg via INTRAVENOUS
  Filled 2024-03-18: qty 5

## 2024-03-18 MED ORDER — DEXAMETHASONE SOD PHOSPHATE PF 10 MG/ML IJ SOLN
10.0000 mg | Freq: Once | INTRAMUSCULAR | Status: AC
  Administered 2024-03-18: 10 mg via INTRAVENOUS
  Filled 2024-03-18: qty 1

## 2024-03-18 MED ORDER — SODIUM CHLORIDE 0.9 % IV SOLN
INTRAVENOUS | Status: DC
  Filled 2024-03-18: qty 250

## 2024-03-18 MED ORDER — SODIUM CHLORIDE 0.9 % IV SOLN
150.0000 mg | Freq: Once | INTRAVENOUS | Status: AC
  Administered 2024-03-18: 150 mg via INTRAVENOUS
  Filled 2024-03-18: qty 150

## 2024-03-18 MED ORDER — DOXORUBICIN HCL CHEMO IV INJECTION 2 MG/ML
60.0000 mg/m2 | Freq: Once | INTRAVENOUS | Status: AC
  Administered 2024-03-18: 116 mg via INTRAVENOUS
  Filled 2024-03-18: qty 58

## 2024-03-18 NOTE — Progress Notes (Signed)
 C/o neuropathy worse in feet, sharp pain, tingle, burning.

## 2024-03-18 NOTE — Patient Instructions (Signed)
 [facility] - A DEPT OF [facility]  Discharge Instructions: Thank you for choosing [facility] to provide your oncology and hematology care.  If you have a lab appointment with the Cancer Center, please go directly to the Cancer Center and check in at the registration area.  Wear comfortable clothing and clothing appropriate for easy access to any Portacath or PICC line.   We strive to give you quality time with your provider. You may need to reschedule your appointment if you arrive late (15 or more minutes).  Arriving late affects you and other patients whose appointments are after yours.  Also, if you miss three or more appointments without notifying the office, you may be dismissed from the clinic at the providers discretion.      For prescription refill requests, have your pharmacy contact our office and allow 72 hours for refills to be completed.    Today you received the following chemotherapy and/or immunotherapy agents: cytoxan    To help prevent nausea and vomiting after your treatment, we encourage you to take your nausea medication as directed.  BELOW ARE SYMPTOMS THAT SHOULD BE REPORTED IMMEDIATELY: *FEVER GREATER THAN 100.4 F (38 C) OR HIGHER *CHILLS OR SWEATING *NAUSEA AND VOMITING THAT IS NOT CONTROLLED WITH YOUR NAUSEA MEDICATION *UNUSUAL SHORTNESS OF BREATH *UNUSUAL BRUISING OR BLEEDING *URINARY PROBLEMS (pain or burning when urinating, or frequent urination) *BOWEL PROBLEMS (unusual diarrhea, constipation, pain near the anus) TENDERNESS IN MOUTH AND THROAT WITH OR WITHOUT PRESENCE OF ULCERS (sore throat, sores in mouth, or a toothache) UNUSUAL RASH, SWELLING OR PAIN  UNUSUAL VAGINAL DISCHARGE OR ITCHING   Items with * indicate a potential emergency and should be followed up as soon as possible or go to the Emergency Department if any problems should occur.  Please show the CHEMOTHERAPY ALERT CARD or IMMUNOTHERAPY ALERT  CARD at check-in to the Emergency Department and triage nurse.  Should you have questions after your visit or need to cancel or reschedule your appointment, please contact [facility] - A DEPT OF [facility]  [phone] and follow the prompts.  Office hours are 8:00 a.m. to 4:30 p.m. Monday - Friday. Please note that voicemails left after 4:00 p.m. may not be returned until the following business day.  We are closed weekends and major holidays. You have access to a nurse at all times for urgent questions. Please call the main number to the clinic [phone] and follow the prompts.  For any non-urgent questions, you may also contact your provider using MyChart. We now offer e-Visits for anyone 75 and older to request care online for non-urgent symptoms. For details visit [email or web address].   Also download the MyChart app! Go to the app store, search MyChart, open the app, select [facility], and log in with your MyChart username and password.

## 2024-03-18 NOTE — Assessment & Plan Note (Addendum)
 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 Ki-67 50%- ONE LYMPH NODE, POSITIVE FOR METASTATIC CARCINOMA (1/1); METASTATIC FOCUS: 4 MM; SUSPICIOUS FOR EXTRANODAL EXTENSION Dr.Cintron- ONCOTYPE- 28. Discussed the role of endocrine therapy-given ER/PR positive disease postsurgery; antihormone pill 1 a day for 10 years;  adjuvant Zometa. Also consider CDK- inhibitors/ PARP inhibitors post surgery. Given multifocality-large size of the tumor; given BRCA 1 positive- PLAN with mastectomy with contralateral prophylactic mastectomy. AUG 18th, 2025- [facility] scan-ejection fraction of 56%.# ON neo-adjuvant chemo- taxol  weekly- with carbo q 3W; and followed by  Adriamycin -Cytoxan  every 3  weeks x 4 cycles. OCT 17th, 2025- Decrease in size of a biopsy-proven RIGHT breast invasive ductal carcinoma from 37 mm to 24 mm.  Also improvement of the right axilla lymphadenopathy.   # CURRENTLY ON neo-adjuvant chemo- proceed with AC chemotherapy every 3 weeks- cycle #4 of planned #4 cycles today- Labs-CBC/chemistries were reviewed with the patient. With Growth factor -Claritin  a day for 7 days; and also can take tylenol /advil as needed. Discussed with Dr.Cintron.   # Nausea- no vomiting- G-1-2- continue anti-emetics. Stable.  # Oral mucositis- on magic mouth wash prn.  Stable.  # blood per rectum- with stool- sec to hemorrhoids monitor for now- stable.   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   # hypokalemia-]likely from diarrhea].-continue  K-Dur twice a day --stable.  # diarrhea -G-1-2- f[rom chemo- metformin - STOPPED metformin ]-IMPROVED  Imodium prn/ ADDlomotil prn. stable.     .  # GERD:  on PPI- stable.   # HTN:  stable.    # Hx of Borderline DM-  s/p nutrition- Continue  metformin  1000 mg ER/ [sec to  Diarrhea]- added glipizide  5 mg XL /day- stable.  # BRCA-positive- prophylactic contralateral mastectomy.  Consider bilateral prophylactic oophorectomy down the line.  Patient interested in mastectomy.  # Social- FMLA/disability-    PS-  # DISPOSITION: # as per IS- chemo today--; D-2 injection # follow up in 4 weeks- MD; labs-  cbc/cmp;-Dr.B

## 2024-03-18 NOTE — Progress Notes (Signed)
 [facility] CONSULT NOTE  Patient Care Team: Jacques Garre, NP as PCP - General (Nurse Practitioner) Dellie Louanne MATSU, MD (General Surgery) Nancylee Duel, MD (Inactive) (Hematology and Oncology) Rennie Cindy SAUNDERS, MD as Consulting Physician (Oncology) Georgina Shasta POUR, RN as Oncology Nurse Navigator  CHIEF COMPLAINTS/PURPOSE OF CONSULTATION: BREAST CANCER   Oncology History Overview Note  # 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   # JULY-AUG 2025- RIGHT BREAST T2; intramammary lymph node positive- N-1- ER-PR POSITIVE: her 2 neg; Ki-67-50%; right breast cancer with T2N1- ER/PR + her 2 NEG breast cancer.   # AUG 14th, 2025- carbo-Taxol - -HOLD taxol  cycle #12 today-sec to PN/fatigue-and also mild anemia. NOV 12th, 2025- start [facility] q 3 W  # Colonoscopy [Dr.sankar-Neg June 2017]  # Smoker; BRCA-1 positive   Primary cancer of right upper lobe of lung (HCC)  10/26/2015 Initial Diagnosis   Primary cancer of right upper lobe of lung (HCC)   Carcinoma of upper-outer quadrant of right breast in female, estrogen receptor positive (HCC)  09/12/2023 Initial Diagnosis   Carcinoma of upper-outer quadrant of right breast in female, estrogen receptor positive (HCC)   09/12/2023 Cancer Staging   Staging form: Breast, AJCC 8th Edition - Clinical: Stage IIA (cT2, cN1, cM0, G2, ER+, PR+, HER2-) - Signed by Rennie Cindy SAUNDERS, MD on 09/29/2023 Histologic grading system: 3 grade system   10/12/2023 -  Chemotherapy   Patient is on Treatment Plan : BREAST Paclitaxel  q7d / AC q21d       HISTORY OF PRESENTING ILLNESS: Patient ambulating-independently. ALONE>   History of Present Illness    Connie Osborne is a 64 year old female with BRCA1 related breast cancer on neoadjuvant  chemotherapy.  Discussed the use of AI scribe software for clinical note transcription with the patient, who gave verbal consent to proceed.  History of Present Illness   Connie Osborne is a 64 year old female with malignant neoplasm of the right breast presenting for follow-up and management of chemotherapy-related toxicities.  She reports worsening chemotherapy-induced peripheral neuropathy over the past week, described as burning sensations and shooting pains, predominantly at night. She uses gabapentin , typically two tablets at night and occasionally one in the morning, sometimes omitting the evening dose. Gabapentin  provides symptomatic relief, though she is uncertain if it improves neuropathy directly or aids sleep. Symptoms are less pronounced during daytime activity.  She experienced nausea and vomiting a few times following her last chemotherapy treatment, which she associates with dietary indiscretion and possibly a viral illness. She has been compliant with antiemetic therapy. She maintains a good appetite and was able to eat a full meal, though she vomited within two hours of consuming greasy food. She denies ongoing nausea or vomiting.  She notes black discoloration of her fingernails and fingertip soreness, which improved after trimming her nails. Previous knee discoloration and pain have resolved. She observed minor hematochezia, which she attributes to hemorrhoids. She denies abnormal bleeding, bruising, or other acute symptoms.        Review of Systems  Constitutional:  Positive for malaise/fatigue. Negative for chills, diaphoresis, fever and weight loss.  HENT:  Negative for nosebleeds and sore throat.   Eyes:  Negative for double vision.  Respiratory:  Negative for cough, hemoptysis, sputum production, shortness of breath and wheezing.   Cardiovascular:  Negative for chest pain, palpitations, orthopnea and leg swelling.  Gastrointestinal:  Negative for abdominal pain, blood  in stool, constipation, diarrhea, heartburn, melena, nausea and vomiting.  Genitourinary:  Negative for dysuria, frequency and urgency.  Musculoskeletal:  Positive for back pain and joint pain.  Skin: Negative.  Negative for itching and rash.  Neurological:  Positive for tingling. Negative for dizziness, focal weakness, weakness and headaches.  Endo/Heme/Allergies:  Does not bruise/bleed easily.  Psychiatric/Behavioral:  Negative for depression. The patient is not nervous/anxious and does not have insomnia.     MEDICAL HISTORY:  Past Medical History:  Diagnosis Date   Allergy    Anxiety    Arthritis    Breast cancer (HCC) 2014   Left- Radiation; BRCA 1 +    COPD (chronic obstructive pulmonary disease) (HCC)    Coughing up blood    GERD (gastroesophageal reflux disease)    Hypertension    Lung cancer (HCC)    Carcinoma, right upper lobe    Personal history of radiation therapy    Pre-diabetes    Shortness of breath dyspnea    Vitamin D deficiency     SURGICAL HISTORY: Past Surgical History:  Procedure Laterality Date   ABDOMINAL HYSTERECTOMY     BREAST BIOPSY Left 2014   +   BREAST BIOPSY Left 06/11/2019   stereo bx, x-clip, negative   BREAST BIOPSY Right 08/30/2023   US  RT BREAST BX W LOC DEV 1ST LESION IMG BX SPEC US  GUIDE 08/30/2023 [facility]   BREAST BIOPSY Right 08/30/2023   US  RT BREAST BX W LOC DEV EA ADD LESION IMG BX SPEC US  GUIDE 08/30/2023 [facility]   BREAST EXCISIONAL BIOPSY Left 2014   BREAST LUMPECTOMY Left 2014   BREAST SURGERY Left 2014   lumpectomy   COLONOSCOPY WITH PROPOFOL  N/A 08/18/2015   Procedure: COLONOSCOPY WITH PROPOFOL ;  Surgeon: Louanne KANDICE Muse, MD;  Location: [facility];  Service: Endoscopy;  Laterality: N/A;   COLONOSCOPY WITH PROPOFOL  N/A 10/12/2016   Procedure: COLONOSCOPY WITH PROPOFOL ;  Surgeon: Muse Louanne KANDICE, MD;  Location: [facility];  Service: Endoscopy;  Laterality: N/A;   ELECTROMAGNETIC NAVIGATION  BROCHOSCOPY Right 07/28/2015   Procedure: ELECTROMAGNETIC NAVIGATION BRONCHOSCOPY;  Surgeon: Nickolas Cellar, MD;  Location: [facility];  Service: Cardiopulmonary;  Laterality: Right;   FOOT SURGERY     FRACTURE SURGERY     GANGLION CYST EXCISION     PORTACATH PLACEMENT N/A 10/02/2023   Procedure: INSERTION, TUNNELED CENTRAL VENOUS DEVICE, WITH PORT;  Surgeon: Rodolph Romano, MD;  Location: [facility];  Service: General;  Laterality: N/A;   THORACOTOMY/LOBECTOMY Right 09/14/2015   Procedure: THORACOTOMY/LOBECTOMY;  Surgeon: Louanne KANDICE Muse, MD;  Location: [facility];  Service: Thoracic;  Laterality: Right;    SOCIAL HISTORY: Social History   Socioeconomic History   Marital status: Single    Spouse name: Not on file   Number of children: Not on file   Years of education: Not on file   Highest education level: Not on file  Occupational History   Not on file  Tobacco Use   Smoking status: Former    Current packs/day: 0.00    Average packs/day: 1 pack/day for 30.0 years (30.0 ttl pk-yrs)    Types: Cigarettes    Start date: 07/29/1985    Quit date: 07/30/2015    Years since quitting: 8.6   Smokeless tobacco: Never  Vaping Use   Vaping status: Never Used  Substance and Sexual Activity   Alcohol use: No  Alcohol/week: 0.0 standard drinks of alcohol   Drug use: No   Sexual activity: Not on file  Other Topics Concern   Not on file  Social History Narrative   Lives alone   Social Drivers of Health   Tobacco Use: Medium Risk (03/18/2024)   Patient History    Smoking Tobacco Use: Former    Smokeless Tobacco Use: Never    Passive Exposure: Not on file  Financial Resource Strain: Medium Risk (09/07/2023)   Received from [facility]   Overall Financial Resource Strain (CARDIA)    Difficulty of Paying Living Expenses: Somewhat hard  Food Insecurity: No Food Insecurity (09/12/2023)   Epic    Worried About Programme Researcher, Broadcasting/film/video in the Last Year: Never true    Ran  Out of Food in the Last Year: Never true  Recent Concern: Food Insecurity - Food Insecurity Present (09/07/2023)   Received from [facility]   Epic    Within the past 12 months, you worried that your food would run out before you got the money to buy more.: Sometimes true    Within the past 12 months, the food you bought just didn't last and you didn't have money to get more.: Never true  Transportation Needs: No Transportation Needs (09/12/2023)   Epic    Lack of Transportation (Medical): No    Lack of Transportation (Non-Medical): No  Physical Activity: Not on file  Stress: Not on file  Social Connections: Not on file  Intimate Partner Violence: Not At Risk (09/12/2023)   Epic    Fear of Current or Ex-Partner: No    Emotionally Abused: No    Physically Abused: No    Sexually Abused: No  Depression (PHQ2-9): Low Risk (03/18/2024)   Depression (PHQ2-9)    PHQ-2 Score: 3  Alcohol Screen: Not on file  Housing: Low Risk (09/12/2023)   Epic    Unable to Pay for Housing in the Last Year: No    Number of Times Moved in the Last Year: 0    Homeless in the Last Year: No  Recent Concern: Housing - High Risk (09/07/2023)   Received from [facility]   Epic    In the last 12 months, was there a time when you were not able to pay the mortgage or rent on time?: Yes    In the past 12 months, how many times have you moved where you were living?: 0    At any time in the past 12 months, were you homeless or living in a shelter (including now)?: No  Utilities: Not At Risk (09/12/2023)   Epic    Threatened with loss of utilities: No  Health Literacy: Adequate Health Literacy (10/04/2023)   B1300 Health Literacy    Frequency of need for help with medical instructions: Never    FAMILY HISTORY: Family History  Problem Relation Age of Onset   Hypertension Mother    Diabetes Mellitus II Mother    Cancer Mother        mets   Cancer Father        long cancer    Cancer Sister 35       breast   Breast cancer Sister 78   Cancer Sister 66       breast   Breast cancer Sister 15   Lung cancer Brother    Cancer Other        breast    ALLERGIES:  is allergic to accupril [quinapril hcl] and percocet [oxycodone -acetaminophen ].  MEDICATIONS:  Current Outpatient Medications  Medication Sig Dispense Refill   amLODipine  (NORVASC ) 10 MG tablet Take 10 mg by mouth daily with lunch.      Calcium Carb-Cholecalciferol (CALCIUM + VITAMIN D3 PO) Take 1 tablet by mouth in the morning and at bedtime.     cetirizine (ZYRTEC) 10 MG tablet Take 10 mg by mouth as needed for allergies.     diphenoxylate -atropine  (LOMOTIL ) 2.5-0.025 MG tablet Take 1 tablet by mouth 4 (four) times daily as needed for diarrhea or loose stools. Take it along with immodium 60 tablet 0   famotidine  (PEPCID ) 20 MG tablet Take 20 mg by mouth daily as needed for heartburn or indigestion.     gabapentin  (NEURONTIN ) 100 MG capsule Take gabapentin  2 pills at nighttime; and if tolerating well-start taking 1 pill in the morning; and 1 pill in the afternoon. 120 capsule 1   glipiZIDE  (GLUCOTROL  XL) 5 MG 24 hr tablet Take 1 tablet (5 mg total) by mouth daily with breakfast. 30 tablet 6   lidocaine -prilocaine  (EMLA ) cream Apply on the port. 30 -45 min  prior to port access. 30 g 3   losartan  (COZAAR ) 100 MG tablet Take 100 mg by mouth daily with lunch.      magic mouthwash (multi-ingredient) oral suspension Take 5 mLs by mouth 4 (four) times daily. 480 mL 3   magic mouthwash w/lidocaine  SOLN Take 5 mLs by mouth 4 (four) times daily. 480 mL 3   metFORMIN  (GLUCOPHAGE ) 500 MG tablet Take 500 mg by mouth 2 (two) times daily with a meal.     metoprolol  succinate (TOPROL -XL) 25 MG 24 hr tablet Take 25 mg by mouth daily with lunch.      ondansetron  (ZOFRAN ) 8 MG tablet One pill every 8 hours as needed for nausea/vomitting. 40 tablet 1   Potassium Chloride  ER 20 MEQ TBCR Take 1 tablet (20 mEq total) by mouth 2  (two) times daily. 120 tablet 1   prochlorperazine  (COMPAZINE ) 10 MG tablet Take 1 tablet (10 mg total) by mouth every 6 (six) hours as needed for nausea or vomiting. 30 tablet 0   traMADol  (ULTRAM ) 50 MG tablet Take 1 tablet (50 mg total) by mouth every 6 (six) hours as needed. 10 tablet 0   No current facility-administered medications for this visit.    PHYSICAL EXAMINATION:   Vitals:   03/18/24 0947 03/18/24 1001  BP: (!) 112/96 124/77  Pulse: 81   Resp: 16   Temp: 98.4 F (36.9 C)   SpO2: 100%    Filed Weights   03/18/24 0947  Weight: 169 lb 1.6 oz (76.7 kg)     Physical Exam Vitals and nursing note reviewed.  HENT:     Head: Normocephalic and atraumatic.     Mouth/Throat:     Pharynx: Oropharynx is clear.  Eyes:     Extraocular Movements: Extraocular movements intact.     Pupils: Pupils are equal, round, and reactive to light.  Cardiovascular:     Rate and Rhythm: Normal rate and regular rhythm.  Pulmonary:     Comments: Decreased breath sounds bilaterally.  Abdominal:     Palpations: Abdomen is soft.  Musculoskeletal:        General: Normal range of motion.     Cervical back: Normal range of motion.  Skin:    General: Skin is warm.  Neurological:     General: No focal deficit present.  Mental Status: She is alert and oriented to person, place, and time.  Psychiatric:        Behavior: Behavior normal.        Judgment: Judgment normal.     LABORATORY DATA:  I have reviewed the data as listed Lab Results  Component Value Date   WBC 5.4 03/18/2024   HGB 10.0 (L) 03/18/2024   HCT 29.4 (L) 03/18/2024   MCV 93.9 03/18/2024   PLT 294 03/18/2024   Recent Labs    01/15/24 1046 01/31/24 0808 02/26/24 0947  NA 137 143 143  K 3.3* 3.8 3.7  CL 102 108 107  CO2 25 27 26   GLUCOSE 182* 111* 125*  BUN 9 11 11   CREATININE 0.44 0.57 0.60  CALCIUM 9.3 10.2 10.1  GFRNONAA >60 >60 >60  PROT 6.4* 6.6 6.6  ALBUMIN 3.4* 3.9 4.2  AST 25 39 29  ALT 10 42  37  ALKPHOS 100 76 69  BILITOT 0.7 <0.2 <0.2    RADIOGRAPHIC STUDIES: I have personally reviewed the radiological images as listed and agreed with the findings in the report. No results found.     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 Ki-67 50%- ONE LYMPH NODE, POSITIVE FOR METASTATIC CARCINOMA (1/1); METASTATIC FOCUS: 4 MM; SUSPICIOUS FOR EXTRANODAL EXTENSION Dr.Cintron- ONCOTYPE- 28. Discussed the role of endocrine therapy-given ER/PR positive disease postsurgery; antihormone pill 1 a day for 10 years;  adjuvant Zometa. Also consider CDK- inhibitors/ PARP inhibitors post surgery. Given multifocality-large size of the tumor; given BRCA 1 positive- PLAN with mastectomy with contralateral prophylactic mastectomy. AUG 18th, 2025- [facility] scan-ejection fraction of 56%.# ON neo-adjuvant chemo- taxol  weekly- with carbo q 3W; and followed by  Adriamycin -Cytoxan  every 3  weeks x 4 cycles. OCT 17th, 2025- Decrease in size of a biopsy-proven RIGHT breast invasive ductal carcinoma from 37 mm to 24 mm.  Also improvement of the right axilla lymphadenopathy.   # CURRENTLY ON neo-adjuvant chemo- proceed with AC chemotherapy every 3 weeks- cycle #4 of planned #4 cycles today- Labs-CBC/chemistries were reviewed with the patient. With Growth factor -Claritin  a day for 7 days; and also can take tylenol /advil as needed.   # Nausea- no vomiting- G-1-2- continue anti-emetics. Stable.  # Oral mucositis- on magic mouth wash prn.  Stable.  # blood per rectum- with stool- sec to hemorrhoids monitor for now- stable.   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   # hypokalemia-]likely from diarrhea].-continue   K-Dur twice a day --stable.  # diarrhea -G-1-2- f[rom chemo- metformin - STOPPED metformin ]-IMPROVED  Imodium prn/ ADDlomotil prn. stable.     .  # GERD:  on PPI- stable.   # HTN:  stable.    # Hx of Borderline DM-  s/p nutrition- Continue  metformin  1000 mg ER/ [sec to Diarrhea]- added glipizide  5 mg XL /day- stable.  # BRCA-positive- prophylactic contralateral mastectomy.  Consider bilateral prophylactic oophorectomy down the line.  Patient interested in mastectomy.  # Social- FMLA/disability-    PS-  # DISPOSITION: # as per IS- chemo today--; D-2 injection # follow up in 4 weeks- MD; labs-  cbc/cmp;-Dr.B     Cindy JONELLE Joe, MD 03/18/2024 10:27 AM

## 2024-03-19 ENCOUNTER — Other Ambulatory Visit: Payer: Self-pay

## 2024-03-19 ENCOUNTER — Encounter: Payer: Self-pay | Admitting: *Deleted

## 2024-03-19 ENCOUNTER — Encounter: Payer: Self-pay | Admitting: Internal Medicine

## 2024-03-19 ENCOUNTER — Inpatient Hospital Stay

## 2024-03-19 DIAGNOSIS — Z5111 Encounter for antineoplastic chemotherapy: Secondary | ICD-10-CM | POA: Diagnosis not present

## 2024-03-19 DIAGNOSIS — C50411 Malignant neoplasm of upper-outer quadrant of right female breast: Secondary | ICD-10-CM

## 2024-03-19 MED ORDER — PEGFILGRASTIM-JMDB 6 MG/0.6ML ~~LOC~~ SOSY
6.0000 mg | PREFILLED_SYRINGE | Freq: Once | SUBCUTANEOUS | Status: AC
  Administered 2024-03-19: 6 mg via SUBCUTANEOUS
  Filled 2024-03-19: qty 0.6

## 2024-03-19 NOTE — Progress Notes (Signed)
 Nutrition Follow-up:  Patient with right breast cancer. Completed chemotherapy on 1/19.    Met with patient following injection.  Reports that her appetite is down following treatment.  Drank a shake this am.  Says that after her last treatment ate a hamburger and fries and then vomited.    Planning to meet with surgeon this week    Medications: reviewed  Labs: reviewed  Anthropometrics:   Weight 169 lb on 1/19 167 lb 1.6 oz on 12/29 174 lb 15 oz on 10/8 177 lb on 10/15 179 lb on 7/15   NUTRITION DIAGNOSIS: Unintentional weight loss continues   INTERVENTION:  Discussed foods less likely to cause stomach upset.   Encouraged foods rich in protein leading up to surgery Contact information provided    NEXT VISIT: no follow-up RD available if needed  Ruari Duggan B. Dasie SOLON, CSO, LDN Registered Dietitian [phone]

## 2024-03-25 ENCOUNTER — Other Ambulatory Visit: Payer: Self-pay

## 2024-04-02 ENCOUNTER — Other Ambulatory Visit: Payer: Self-pay | Admitting: General Surgery

## 2024-04-02 DIAGNOSIS — C50211 Malignant neoplasm of upper-inner quadrant of right female breast: Secondary | ICD-10-CM

## 2024-04-02 DIAGNOSIS — Z1501 Genetic susceptibility to malignant neoplasm of breast: Secondary | ICD-10-CM

## 2024-04-03 ENCOUNTER — Inpatient Hospital Stay: Attending: Internal Medicine | Admitting: Occupational Therapy

## 2024-04-03 ENCOUNTER — Other Ambulatory Visit: Payer: Self-pay

## 2024-04-03 ENCOUNTER — Encounter: Payer: Self-pay | Admitting: Occupational Therapy

## 2024-04-03 DIAGNOSIS — M25612 Stiffness of left shoulder, not elsewhere classified: Secondary | ICD-10-CM

## 2024-04-03 NOTE — Therapy (Signed)
 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; BRCA 1 +    COPD (chronic obstructive pulmonary disease) (HCC)    Coughing up blood    GERD (gastroesophageal reflux disease)    Hypertension    Lung cancer (HCC)    Carcinoma, right upper lobe    Personal history of radiation therapy    Pre-diabetes    Shortness of breath dyspnea    Vitamin D deficiency    Past Surgical History:  Procedure Laterality Date   ABDOMINAL HYSTERECTOMY     BREAST BIOPSY Left 2014   +   BREAST BIOPSY Left 06/11/2019   stereo bx, x-clip, negative   BREAST BIOPSY Right 08/30/2023   US  RT BREAST BX W LOC DEV 1ST LESION IMG BX SPEC US  GUIDE 08/30/2023 [facility]   BREAST BIOPSY Right 08/30/2023   US  RT BREAST BX W LOC DEV EA ADD LESION IMG BX SPEC US  GUIDE 08/30/2023 [facility]   BREAST EXCISIONAL BIOPSY Left 2014   BREAST LUMPECTOMY Left 2014   BREAST SURGERY Left 2014   lumpectomy   COLONOSCOPY WITH PROPOFOL  N/A 08/18/2015   Procedure: COLONOSCOPY WITH PROPOFOL ;  Surgeon: Louanne KANDICE Muse, MD;  Location: [facility];  Service: Endoscopy;  Laterality: N/A;   COLONOSCOPY WITH PROPOFOL  N/A 10/12/2016   Procedure: COLONOSCOPY WITH PROPOFOL ;  Surgeon: Muse Louanne KANDICE, MD;  Location: [facility];  Service: Endoscopy;  Laterality: N/A;   ELECTROMAGNETIC NAVIGATION BROCHOSCOPY Right 07/28/2015    Procedure: ELECTROMAGNETIC NAVIGATION BRONCHOSCOPY;  Surgeon: Nickolas Cellar, MD;  Location: [facility];  Service: Cardiopulmonary;  Laterality: Right;   FOOT SURGERY     FRACTURE SURGERY     GANGLION CYST EXCISION     PORTACATH PLACEMENT N/A 10/02/2023   Procedure: INSERTION, TUNNELED CENTRAL VENOUS DEVICE, WITH PORT;  Surgeon: Rodolph Romano, MD;  Location: [facility];  Service: General;  Laterality: N/A;   THORACOTOMY/LOBECTOMY Right 09/14/2015   Procedure: THORACOTOMY/LOBECTOMY;  Surgeon: Louanne KANDICE Muse, MD;  Location: [facility];  Service: Thoracic;  Laterality: Right;   Patient Active Problem List   Diagnosis Date Noted   Chemotherapy-induced peripheral neuropathy 12/14/2023   Hypokalemia 11/02/2023   Carcinoma of upper-outer quadrant of right breast in female, estrogen receptor positive (HCC) 09/12/2023   Ductal carcinoma in situ (DCIS) of left breast 03/22/2016   Primary cancer of right upper lobe of lung (HCC) 10/26/2015   Cancer of lung (HCC) 09/14/2015   Lung mass    Solitary pulmonary nodule 07/13/2015   Hemoptysis 06/30/2015   BRCA gene positive 03/26/2013    PCP: SPencer NP  REFERRING PROVIDER: DR Rennie   REFERRING DIAG: R breast cancer   THERAPY DIAG:  Joint stiffness of both shoulders  Rationale for Evaluation and Treatment: Rehabilitation  ONSET DATE:  09/20/23  SUBJECTIVE:                                                                                                                                                                                           SUBJECTIVE STATEMENT: Patient reports she is here today after being refer by one of her medical team for her newly diagnosed right breast cancer.   PERTINENT HISTORY:  Patient was diagnosed with right  breast cancer -patient had left breast cancer in the past with a lumpectomy and radiation. Patient just finished mid-January chemotherapy for right breast cancer.  And is planned to have double  mastectomy on 04/24/2024 by Dr. Rodolph  PATIENT GOALS:   reduce lymphedema risk and learn post op HEP.   PAIN:  Are you having pain?  Denies pain  PRECAUTIONS: Active CA , bilateral lymphedema risk    HAND DOMINANCE: right  WEIGHT BEARING RESTRICTIONS: No  FALLS:  Has patient fallen in last 6 months? No  LIVING ENVIRONMENT: Patient lives with: Husband  OCCUPATION and LEISURE: Works as a LAWYER prior to chemotherapy there are certain own housework and watch TV   OBJECTIVE:  COGNITION: Overall cognitive status: Within functional limits for tasks assessed    POSTURE:  Forward head and rounded shoulders posture  UPPER EXTREMITY AROM/PROM:  Bilateral shoulder active range of motion within functional limits  CERVICAL AROM: All within normal limits:     UPPER EXTREMITY STRENGTH: 5 -/5 for strength especially bilateral shoulder flexion abduction overhead 5 -/5 for external or internal rotation  LYMPHEDEMA ASSESSMENTS:   LYMPHEDEMA/ONCOLOGY QUESTIONNAIRE - 04/03/24 0001       Right Upper Extremity Lymphedema   15 cm Proximal to Olecranon Process 31 cm    10 cm Proximal to Olecranon Process 29.3 cm    Olecranon Process 25.2 cm    15 cm Proximal to Ulnar Styloid Process 23.5 cm    10 cm Proximal to Ulnar Styloid Process 20 cm    Just Proximal to Ulnar Styloid Process 15.7 cm    Across Hand at [facility] 18 cm    At [place] of 2nd Digit 5.8 cm      Left Upper Extremity Lymphedema   15 cm Proximal to Olecranon Process 31 cm    10 cm Proximal to Olecranon Process 28.2 cm    Olecranon Process 24.2 cm    15 cm Proximal to Ulnar Styloid Process 22.8 cm    10 cm Proximal to Ulnar Styloid Process 19.3 cm    Just Proximal to Ulnar Styloid Process 15.3 cm    Across Hand at [facility] 17.5 cm  At [facility] of 2nd Digit 5.5 cm           L-DEX LYMPHEDEMA SCREENING:  L-Dex score not done because of patient has breast cancer in the past for left side and now  breast cancer on the right     PATIENT EDUCATION:  Education details: Lymphedema risk reduction and post op shoulder/posture HEP Person educated: Patient Education method: Explanation, Demonstration, Handout Education comprehension: Patient verbalized understanding and returned demonstration  HOME EXERCISE PROGRAM: Patient was educated in active assisted range of motion to be initiated few days after surgery for shoulder flexion and abduction in supine using wand to 90 degrees.  While drain sets in.  As well as external rotation with gravity right side and left side separate and if symptoms improve can do bilateral 12 reps 3 times a day If okay by surgeon or drain removed patient can go more than 90 degrees keeping pain under 2/10. Patient to initiate some active assisted range of motion now prior to surgery recommended as well as scapular retraction 3 times a day 12 reps  ASSESSMENT:  CLINICAL IMPRESSION: Her multidisciplinary medical team has met to assess and determine a recommended treatment plan. She is planning to have bilateral mastectomy on 04/24/2024 by Dr. Rodolph. She will benefit from a post op OT reassessment to determine needs and from L-Dex screens every 3 months for 2 years to detect subclinical lymphedema.  Pt will benefit from skilled therapeutic intervention to improve on the following deficits: Decreased knowledge of precautions and lymphedema education, impaired UE functional use, pain, decreased ROM, postural dysfunction.   OT treatment/interventions: ADL/self-care home management, pt/family education, therapeutic exercise,manual therapy  REHAB POTENTIAL: Good  CLINICAL DECISION MAKING: Stable/uncomplicated  EVALUATION COMPLEXITY: Low   GOALS: Goals reviewed with patient? YES  LONG TERM GOALS: (STG=LTG)    Name Target Date Goal status  1 Pt will be able to verbalize understanding of pertinent lymphedema risk reduction practices relevant to her dx  specifically related to skin care.  Baseline:  No knowledge 06/26/2024 Achieved at eval  2 Pt will be able to return demo and/or verbalize understanding of the post op HEP related to regaining shoulder ROM. Baseline:  No knowledge Today Achieved at eval       4 Pt will demo she has regained full shoulder ROM and function post operatively compared to baselines.  Baseline: See objective measurements taken today. 06/26/24 Initial    PLAN:  OT FREQUENCY/DURATION: EVAL and 5 follow up appointment.  12 weeks  PLAN FOR NEXT SESSION: will reassess 3 weeks post op to determine needs. Occupational Therapy Information for After Breast Cancer Surgery/Treatment:  Lymphedema is a swelling condition that you may be at risk for in your arm if you have lymph nodes removed from the armpit area.  After a sentinel node biopsy, the risk is approximately 5-9% and is higher after an axillary node dissection.  There is treatment available for this condition and it is not life-threatening.  Contact your physician or occupational therapist with concerns. You may begin the 4 shoulder/posture exercises (see additional sheet) when permitted by your physician (typically a week after surgery).  If you have drains, you may need to wait until those are removed before beginning range of motion exercises.  A general recommendation is to not lift your arms above shoulder height until drains are removed.  These exercises should be done to your tolerance and gently.  This is not a no pain/no gain type of recovery so listen  to your body and stretch into the range of motion that you can tolerate, stopping if you have pain.  If you are having immediate reconstruction, ask your plastic surgeon about doing exercises as he or she may want you to wait. .  While undergoing any medical procedure or treatment, try to avoid blood pressure being taken or needle sticks from occurring on the arm on the side of cancer.   This recommendation begins  after surgery and continues for the rest of your life.  This may help reduce your risk of getting lymphedema (swelling in your arm). An excellent resource for those seeking information on lymphedema is the [employer]'s web site. It can be accessed at www.lymphnet.org If you notice swelling in your hand, arm or breast at any time following surgery (even if it is many years from now), please contact your doctor or occupational therapist to discuss this.  Lymphedema can be treated at any time but it is easier for you if it is treated early on.  If you feel like your shoulder motion is not returning to normal in a reasonable amount of time, please contact your surgeon or occupational therapist.  [facility] [phone]. [address] [place], [place] [address]      Ancel Peters, OTR/L,CLT 04/03/2024, 7:00 PM   "

## 2024-04-04 ENCOUNTER — Other Ambulatory Visit: Payer: Self-pay

## 2024-04-15 ENCOUNTER — Inpatient Hospital Stay

## 2024-04-15 ENCOUNTER — Inpatient Hospital Stay: Admitting: Internal Medicine

## 2024-04-24 ENCOUNTER — Ambulatory Visit: Admit: 2024-04-24 | Admitting: General Surgery

## 2024-04-24 ENCOUNTER — Ambulatory Visit

## 2024-05-15 ENCOUNTER — Inpatient Hospital Stay: Admitting: Occupational Therapy
